# Patient Record
Sex: Male | Born: 1935 | Race: White | Hispanic: No | Marital: Married | Smoking: Former smoker
Health system: Southern US, Community
[De-identification: ages and names within clinical notes are randomized; demographics above are authoritative.]

## PROBLEM LIST (undated history)

## (undated) DIAGNOSIS — K219 Gastro-esophageal reflux disease without esophagitis: Secondary | ICD-10-CM

## (undated) DIAGNOSIS — I10 Essential (primary) hypertension: Secondary | ICD-10-CM

## (undated) DIAGNOSIS — J9819 Other pulmonary collapse: Secondary | ICD-10-CM

## (undated) DIAGNOSIS — Z8673 Personal history of transient ischemic attack (TIA), and cerebral infarction without residual deficits: Secondary | ICD-10-CM

## (undated) DIAGNOSIS — E119 Type 2 diabetes mellitus without complications: Secondary | ICD-10-CM

## (undated) DIAGNOSIS — N200 Calculus of kidney: Secondary | ICD-10-CM

## (undated) DIAGNOSIS — I2510 Atherosclerotic heart disease of native coronary artery without angina pectoris: Secondary | ICD-10-CM

## (undated) HISTORY — PX: HEMORRHOID SURGERY: SHX153

## (undated) HISTORY — DX: Atherosclerotic heart disease of native coronary artery without angina pectoris: I25.10

## (undated) HISTORY — PX: TONSILLECTOMY: SUR1361

## (undated) HISTORY — PX: APPENDECTOMY: SHX54

## (undated) HISTORY — PX: KIDNEY STONE SURGERY: SHX686

---

## 2005-02-08 ENCOUNTER — Ambulatory Visit: Payer: Self-pay | Admitting: Internal Medicine

## 2005-02-08 ENCOUNTER — Ambulatory Visit (HOSPITAL_COMMUNITY): Admission: RE | Admit: 2005-02-08 | Discharge: 2005-02-08 | Payer: Self-pay | Admitting: Internal Medicine

## 2006-11-25 ENCOUNTER — Encounter: Admission: RE | Admit: 2006-11-25 | Discharge: 2006-11-25 | Payer: Self-pay | Admitting: Orthopedic Surgery

## 2006-11-28 ENCOUNTER — Ambulatory Visit (HOSPITAL_BASED_OUTPATIENT_CLINIC_OR_DEPARTMENT_OTHER): Admission: RE | Admit: 2006-11-28 | Discharge: 2006-11-28 | Payer: Self-pay | Admitting: Orthopedic Surgery

## 2007-02-12 ENCOUNTER — Ambulatory Visit (HOSPITAL_BASED_OUTPATIENT_CLINIC_OR_DEPARTMENT_OTHER): Admission: RE | Admit: 2007-02-12 | Discharge: 2007-02-12 | Payer: Self-pay | Admitting: Orthopedic Surgery

## 2013-12-31 ENCOUNTER — Encounter (HOSPITAL_COMMUNITY): Payer: Self-pay | Admitting: Emergency Medicine

## 2013-12-31 ENCOUNTER — Emergency Department (HOSPITAL_COMMUNITY)
Admission: EM | Admit: 2013-12-31 | Discharge: 2013-12-31 | Disposition: A | Discharge status: Home / Self Care | Payer: Medicare Other | Attending: Emergency Medicine | Admitting: Emergency Medicine

## 2013-12-31 ENCOUNTER — Other Ambulatory Visit: Payer: Self-pay

## 2013-12-31 ENCOUNTER — Emergency Department (HOSPITAL_COMMUNITY): Payer: Medicare Other

## 2013-12-31 DIAGNOSIS — R21 Rash and other nonspecific skin eruption: Secondary | ICD-10-CM | POA: Insufficient documentation

## 2013-12-31 DIAGNOSIS — K219 Gastro-esophageal reflux disease without esophagitis: Secondary | ICD-10-CM | POA: Insufficient documentation

## 2013-12-31 DIAGNOSIS — Z79899 Other long term (current) drug therapy: Secondary | ICD-10-CM | POA: Insufficient documentation

## 2013-12-31 DIAGNOSIS — R0781 Pleurodynia: Secondary | ICD-10-CM

## 2013-12-31 DIAGNOSIS — Z87442 Personal history of urinary calculi: Secondary | ICD-10-CM | POA: Insufficient documentation

## 2013-12-31 DIAGNOSIS — R071 Chest pain on breathing: Secondary | ICD-10-CM | POA: Insufficient documentation

## 2013-12-31 DIAGNOSIS — I10 Essential (primary) hypertension: Secondary | ICD-10-CM | POA: Insufficient documentation

## 2013-12-31 DIAGNOSIS — E119 Type 2 diabetes mellitus without complications: Secondary | ICD-10-CM | POA: Insufficient documentation

## 2013-12-31 DIAGNOSIS — Z87891 Personal history of nicotine dependence: Secondary | ICD-10-CM | POA: Insufficient documentation

## 2013-12-31 DIAGNOSIS — R609 Edema, unspecified: Secondary | ICD-10-CM | POA: Insufficient documentation

## 2013-12-31 HISTORY — DX: Gastro-esophageal reflux disease without esophagitis: K21.9

## 2013-12-31 HISTORY — DX: Calculus of kidney: N20.0

## 2013-12-31 HISTORY — DX: Type 2 diabetes mellitus without complications: E11.9

## 2013-12-31 HISTORY — DX: Essential (primary) hypertension: I10

## 2013-12-31 LAB — BASIC METABOLIC PANEL
BUN: 24 mg/dL — AB (ref 6–23)
CHLORIDE: 98 meq/L (ref 96–112)
CO2: 28 mEq/L (ref 19–32)
CREATININE: 1.04 mg/dL (ref 0.50–1.35)
Calcium: 9.3 mg/dL (ref 8.4–10.5)
GFR, EST AFRICAN AMERICAN: 78 mL/min — AB (ref >90)
GFR, EST NON AFRICAN AMERICAN: 67 mL/min — AB (ref >90)
Glucose, Bld: 151 mg/dL — ABNORMAL HIGH (ref 70–99)
Potassium: 4.1 mEq/L (ref 3.7–5.3)
Sodium: 139 mEq/L (ref 137–147)

## 2013-12-31 LAB — CBC WITH DIFFERENTIAL/PLATELET
BASOS PCT: 0 % (ref 0–1)
Basophils Absolute: 0 10*3/uL (ref 0.0–0.1)
EOS ABS: 0.2 10*3/uL (ref 0.0–0.7)
Eosinophils Relative: 2 % (ref 0–5)
HEMATOCRIT: 40.4 % (ref 39.0–52.0)
HEMOGLOBIN: 13.8 g/dL (ref 13.0–17.0)
Lymphocytes Relative: 24 % (ref 12–46)
Lymphs Abs: 1.7 10*3/uL (ref 0.7–4.0)
MCH: 32.9 pg (ref 26.0–34.0)
MCHC: 34.2 g/dL (ref 30.0–36.0)
MCV: 96.2 fL (ref 78.0–100.0)
MONO ABS: 0.7 10*3/uL (ref 0.1–1.0)
MONOS PCT: 9 % (ref 3–12)
Neutro Abs: 4.7 10*3/uL (ref 1.7–7.7)
Neutrophils Relative %: 65 % (ref 43–77)
Platelets: 164 10*3/uL (ref 150–400)
RBC: 4.2 MIL/uL — ABNORMAL LOW (ref 4.22–5.81)
RDW: 12.6 % (ref 11.5–15.5)
WBC: 7.3 10*3/uL (ref 4.0–10.5)

## 2013-12-31 LAB — D-DIMER, QUANTITATIVE: D-Dimer, Quant: 0.6 ug/mL-FEU — ABNORMAL HIGH (ref 0.00–0.48)

## 2013-12-31 LAB — TROPONIN I: Troponin I: <0.3 ng/mL (ref <0.30)

## 2013-12-31 MED ORDER — OXYCODONE-ACETAMINOPHEN 5-325 MG PO TABS
2.0000 | ORAL_TABLET | Freq: Once | ORAL | Status: AC
  Administered 2013-12-31: 2 via ORAL
  Filled 2013-12-31: qty 2

## 2013-12-31 MED ORDER — IOHEXOL 350 MG/ML SOLN
100.0000 mL | Freq: Once | INTRAVENOUS | Status: AC | PRN
  Administered 2013-12-31: 100 mL via INTRAVENOUS
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
## 2013-12-31 NOTE — ED Notes (Signed)
Pt alert & oriented x4, stable gait. Patient given discharge instructions, paperwork & prescription(s). Patient Parent verbalized understanding. Pt left department w/ no further questions.

## 2013-12-31 NOTE — ED Provider Notes (Signed)
CSN: [id number]     Arrival date & time 12/31/13  1854 History  This chart was scribed for Hilario Quarryanielle S Fredick Schlosser, MD by Quintella ReichertMatthew Underwood, ED scribe.  This patient was seen in room [facility] and the patient's care was started at 7:20 PM.   Chief Complaint  Patient presents with  . Chest Pain    Patient is a 80 y.o. male presenting with chest pain. The history is provided by the patient. No language interpreter was used.  Chest Pain Pain location:  R chest Duration: 1-2 hours. Progression:  Worsening Chronicity:  New Worsened by:  Deep breathing Associated symptoms: no cough, no fever, no lower extremity edema and no shortness of breath     HPI Comments: Westley Footsroy F Ehrler Jr. is a 80 y.o. male with h/o HTN and DM who presents to the Emergency Department complaining of 1-2 hours of right-sided lower front chest pain on deep breathing.  Pain is constant and mild when not breathing but becomes severe with deep breathing.  Pt states it hurts to breathe but denies actual SOB.  He denies new cough, fever, or leg swelling.  He denies h/o DVT/PE or recent long travel.    Past Medical History  Diagnosis Date  . Hypertension   . Diabetes mellitus without complication   . GERD (gastroesophageal reflux disease)   . Kidney stones     Past Surgical History  Procedure Laterality Date  . Kidney stone surgery    . Hemorrhoid surgery      History reviewed. No pertinent family history.   History  Substance Use Topics  . Smoking status: Former Games developermoker  . Smokeless tobacco: Not on file  . Alcohol Use: No     Review of Systems  Constitutional: Negative for fever.  Respiratory: Negative for cough and shortness of breath.   Cardiovascular: Positive for chest pain. Negative for leg swelling.  All other systems reviewed and are negative.     Allergies  Sulfa antibiotics  Home Medications   Current Outpatient Rx  Name  Route  Sig  Dispense  Refill  . metFORMIN (GLUCOPHAGE) 500 MG tablet    Oral   Take 500 mg by mouth 2 (two) times daily with a meal.         . omeprazole (PRILOSEC) 20 MG capsule   Oral   Take 20 mg by mouth daily.         . ramipril (ALTACE) 2.5 MG capsule   Oral   Take 2.5 mg by mouth daily.          BP 145/65  Pulse 97  Temp(Src) 98 F (36.7 C) (Oral)  Resp 18  Ht 5\' 9"  (1.753 m)  Wt 150 lb (68.04 kg)  BMI 22.14 kg/m2  SpO2 97%   Physical Exam  Nursing note and vitals reviewed. Constitutional: He is oriented to person, place, and time. He appears well-developed and well-nourished. No distress.  HENT:  Head: Normocephalic and atraumatic.  Mouth/Throat: Mucous membranes are dry.  Hearing aids in both ears mms slightly dry  Eyes: EOM are normal.  Neck: Neck supple. No tracheal deviation present.  Cardiovascular: Normal rate.   Pulmonary/Chest: Effort normal. No respiratory distress.  Musculoskeletal: Normal range of motion. He exhibits edema.  Minimal edema bilaterally, right greater than left.  Neurological: He is alert and oriented to person, place, and time.  Skin: Skin is warm and dry. Rash noted.  Erythematous patches from mid-lower cheset down to above umbilicus, and on right  arm.  Psychiatric: He has a normal mood and affect. His behavior is normal.    ED Course  Procedures (including critical care time)  DIAGNOSTIC STUDIES: Oxygen Saturation is 97% on room air, normal by my interpretation.    COORDINATION OF CARE: 7:26 PM-Discussed treatment plan which includes EKG with pt at bedside and pt agreed to plan.   . Date: 12/31/2013  Rate: 93  Rhythm: normal sinus rhythm  QRS Axis: normal  Intervals: normal  ST/T Wave abnormalities: normal  Conduction Disutrbances:none  Narrative Interpretation:   Old EKG Reviewed: none available  Labs Review Labs Reviewed  CBC WITH DIFFERENTIAL - Abnormal; Notable for the following:    RBC 4.20 (*)    All other components within normal limits  BASIC METABOLIC PANEL - Abnormal;  Notable for the following:    Glucose, Bld 151 (*)    BUN 24 (*)    GFR calc non Af Amer 67 (*)    GFR calc Af Amer 78 (*)    All other components within normal limits  D-DIMER, QUANTITATIVE - Abnormal; Notable for the following:    D-Dimer, Quant 0.60 (*)    All other components within normal limits  TROPONIN I    Imaging Review Dg Chest 2 View  12/31/2013   CLINICAL DATA:  Hypertension.  Chest pain.  EXAM: CHEST  2 VIEW  COMPARISON:  11/25/2006  FINDINGS: Shallow inspiration. Heart size and pulmonary vascularity are normal for technique. Calcified and tortuous aorta. No focal airspace disease in the lungs. Degenerative changes in the spine. No significant change since prior study.  IMPRESSION: No active cardiopulmonary disease.   Electronically Signed   By: Burman Nieves M.D.   On: 12/31/2013 21:57   Ct Angio Chest Pe W/cm &/or Wo Cm  12/31/2013   CLINICAL DATA:  Right chest pain worsening with inspiration. D-dimer 0.6  EXAM: CT ANGIOGRAPHY CHEST WITH CONTRAST  TECHNIQUE: Multidetector CT imaging of the chest was performed using the standard protocol during bolus administration of intravenous contrast. Multiplanar CT image reconstructions including MIPs were obtained to evaluate the vascular anatomy.  CONTRAST:  OMNIPAQUE IOHEXOL 350 MG/ML SOLN  COMPARISON:  None.  FINDINGS: Technically adequate study with good opacification of the central and segmental pulmonary arteries. No focal filling defects are demonstrated. No evidence of significant pulmonary embolus.  Normal heart size. Normal caliber thoracic aorta. No aortic dissection. Calcification in the aortic arch. Esophagus is mostly decompressed. Scattered lymph nodes in the mediastinum are not pathologically enlarged. No pleural effusions. Visualized portions of the upper abdominal organs demonstrate a large cyst in the right kidney.  Dependent atelectatic changes in the lung bases. No focal airspace disease or consolidation. Airways  appear patent. No pneumothorax. Degenerative changes in the spine.  Review of the MIP images confirms the above findings.  IMPRESSION: No evidence of significant pulmonary embolus.   Electronically Signed   By: Burman Nieves M.D.   On: 12/31/2013 22:06    EKG Interpretation   None       MDM  No diagnosis found. 80 year old male with pleuritic type right-sided chest pain presents today with negative workup for pulmonary embolism. EKG was normal and troponins are normal. Have extremely low index of suspicion for this being cardiac in nature. The patient has been observed for several hours and has pain only with deep inspiration. He is advised regarding followup and discharged home.  I personally performed the services described in this documentation, which was scribed in my  presence. The recorded information has been reviewed and considered.    Hilario Quarry, MD 12/31/13 [phone]
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
## 2013-12-31 NOTE — Discharge Instructions (Signed)
Pleurisy °Pleurisy is redness, puffiness (swelling), and soreness (inflammation) of the lining of the lungs. It can be hard to breathe and hurt to breathe. Coughing or deep breathing will make it hurt more. It is often caused by an existing infection or disease.  °HOME CARE °· Only take medicine as told by your doctor. °· Only take antibiotic medicine as directed. Make sure to finish it even if you start to feel better. °GET HELP RIGHT AWAY IF:  °· Your lips, fingernails, or toenails are blue or dark. °· You cough up blood. °· You have a hard time breathing. °· Your pain is not controlled with medicine or it lasts for more than 1 week. °· Your pain spreads (radiates) into your neck, arms, or jaw. °· You are short of breath or wheezing. °· You develop a fever, rash, throw up (vomit), or faint. °MAKE SURE YOU:  °· Understand these instructions. °· Will watch your condition. °· Will get help right away if you are not doing well or get worse. °Document Released: 11/29/2008 Document Revised: 08/19/2013 Document Reviewed: 05/31/2013 °[employer]® Patient Information ©2014 [employer]. ° °

## 2014-07-22 ENCOUNTER — Ambulatory Visit (INDEPENDENT_AMBULATORY_CARE_PROVIDER_SITE_OTHER): Payer: Medicare Other | Admitting: Internal Medicine

## 2014-07-22 ENCOUNTER — Encounter (INDEPENDENT_AMBULATORY_CARE_PROVIDER_SITE_OTHER): Payer: Self-pay | Admitting: Internal Medicine

## 2014-07-22 VITALS — BP 122/66 | HR 88 | Temp 98.0°F | Ht 69.0 in | Wt 151.9 lb

## 2014-07-22 DIAGNOSIS — K5900 Constipation, unspecified: Secondary | ICD-10-CM | POA: Insufficient documentation

## 2014-07-22 DIAGNOSIS — K219 Gastro-esophageal reflux disease without esophagitis: Secondary | ICD-10-CM | POA: Insufficient documentation

## 2014-07-22 DIAGNOSIS — I10 Essential (primary) hypertension: Secondary | ICD-10-CM

## 2014-07-22 DIAGNOSIS — E119 Type 2 diabetes mellitus without complications: Secondary | ICD-10-CM | POA: Insufficient documentation

## 2014-07-22 NOTE — Progress Notes (Signed)
Subjective:     Patient ID: Martin Frazier, male   DOB: 09/08/1934, 80 y.o.   MRN: [id number]  HPI Referred to our office by Dr. Margo Common for IBS.  Patient states when he eats, he has continuous burping.  His symptoms start 30 minutes to an hour after he eats. He also has rt lower quadrant pain. He has had these symptoms all his life. He tells me he has started eating in smaller amounts.  Sometimes he will have reflux at night and can feel it come up into his esophagus.  He says he stays hungry.   He was given Doxycyline x 7 days for removal of a skin cancer. Says he became constipated after that. He tells me his stools are hard and sometimes he will have 2-3 stools a day. Presently taking Acidophilus and eats prunes in the morning. For the most part is appetite is good. No weight loss. Sometimes he has pill dysphagia but not on a daily basis. He tells me his acid reflux for the most part controlled with Omeprazole.  No melena or BRRB. 02/08/2005 Colonoscopy, Dr. Karilyn Cota: recurrent lower abdominal pain and intermittent postprandial diarrhea felt to have IBS who has not responded to therapy: Tortuous, very noncompliant sigmoid colon preventing complete examination. Sigmoid colon and rectal mucosa was normal. Small external hemorrhoids with an anal papilla.   Review of Systems Past Medical History  Diagnosis Date  . Hypertension   . Diabetes mellitus without complication     over 10 yrs  . GERD (gastroesophageal reflux disease)   . Kidney stones     Past Surgical History  Procedure Laterality Date  . Kidney stone surgery    . Hemorrhoid surgery      Allergies  Allergen Reactions  . Sulfa Antibiotics Nausea And Vomiting    Current Outpatient Prescriptions on File Prior to Visit  Medication Sig Dispense Refill  . aspirin EC 81 MG tablet Take 81 mg by mouth daily.      . Ginkgo Biloba (GINKOBA PO) Take by mouth.      . metFORMIN (GLUCOPHAGE) 500 MG tablet Take 500 mg by mouth 2 (two) times  daily with a meal.      . naproxen sodium (ALEVE) 220 MG tablet Take 220 mg by mouth as needed.       Marland Kitchen omeprazole (PRILOSEC) 20 MG capsule Take 20 mg by mouth daily.      . ramipril (ALTACE) 2.5 MG capsule Take 2.5 mg by mouth daily.      . Saw Palmetto, Serenoa repens, (SAW PALMETTO PO) Take 1 capsule by mouth daily.      . vitamin E 400 UNIT capsule Take 400 Units by mouth daily.       No current facility-administered medications on file prior to visit.        Objective:   Physical Exam  Filed Vitals:   07/22/14 1422  BP: 122/66  Pulse: 88  Temp: 98 F (36.7 C)  Height: 5\' 9"  (1.753 m)  Weight: 151 lb 14.4 oz (68.901 kg)  Alert and oriented. Skin warm and dry. Oral mucosa is moist.   . Sclera anicteric, conjunctivae is pink. Thyroid not enlarged. No cervical lymphadenopathy. Lungs clear. Heart regular rate and rhythm.  Abdomen is soft. Bowel sounds are positive. No hepatomegaly. No abdominal masses felt. No tenderness.  No edema to lower extremities.         Assessment:    Constipation. Patient states started couple of months  ago after taking Doxycycline. I discussed this case with Dr. Karilyn Cotaehman. Next colonoscopy next year.     Plan:    Colace one in am and one in pm. PR in 2 weeks. If not better will switch to Miralax 1/2 scoop daily.

## 2014-07-22 NOTE — Patient Instructions (Signed)
Colace one in am and one in pm. PR in 2 weeks. If not better, Miralax 1/2 scoop daily

## 2014-08-05 ENCOUNTER — Telehealth (INDEPENDENT_AMBULATORY_CARE_PROVIDER_SITE_OTHER): Payer: Self-pay | Admitting: *Deleted

## 2014-08-05 NOTE — Telephone Encounter (Signed)
Stools are formed. Usually has 2 BMs a day.  States he is dong better.

## 2014-08-05 NOTE — Telephone Encounter (Signed)
Patient was told to call you in 2 weeks to talk to you about his progress

## 2014-08-05 NOTE — Telephone Encounter (Signed)
Take either the Miralax or Colace. PR in 2 weeks

## 2014-12-17 ENCOUNTER — Encounter (INDEPENDENT_AMBULATORY_CARE_PROVIDER_SITE_OTHER): Payer: Self-pay

## 2016-04-20 ENCOUNTER — Encounter (HOSPITAL_COMMUNITY): Payer: Self-pay | Admitting: Emergency Medicine

## 2016-04-20 ENCOUNTER — Emergency Department (HOSPITAL_COMMUNITY)
Admission: EM | Admit: 2016-04-20 | Discharge: 2016-04-20 | Disposition: A | Discharge status: Home / Self Care | Payer: Medicare Other | Attending: Emergency Medicine | Admitting: Emergency Medicine

## 2016-04-20 DIAGNOSIS — Z87891 Personal history of nicotine dependence: Secondary | ICD-10-CM | POA: Diagnosis not present

## 2016-04-20 DIAGNOSIS — Y999 Unspecified external cause status: Secondary | ICD-10-CM | POA: Insufficient documentation

## 2016-04-20 DIAGNOSIS — Y929 Unspecified place or not applicable: Secondary | ICD-10-CM | POA: Diagnosis not present

## 2016-04-20 DIAGNOSIS — E119 Type 2 diabetes mellitus without complications: Secondary | ICD-10-CM | POA: Diagnosis not present

## 2016-04-20 DIAGNOSIS — Y9389 Activity, other specified: Secondary | ICD-10-CM | POA: Diagnosis not present

## 2016-04-20 DIAGNOSIS — Z7984 Long term (current) use of oral hypoglycemic drugs: Secondary | ICD-10-CM | POA: Insufficient documentation

## 2016-04-20 DIAGNOSIS — W108XXA Fall (on) (from) other stairs and steps, initial encounter: Secondary | ICD-10-CM | POA: Insufficient documentation

## 2016-04-20 DIAGNOSIS — W19XXXA Unspecified fall, initial encounter: Secondary | ICD-10-CM

## 2016-04-20 DIAGNOSIS — Z79899 Other long term (current) drug therapy: Secondary | ICD-10-CM | POA: Diagnosis not present

## 2016-04-20 DIAGNOSIS — Z23 Encounter for immunization: Secondary | ICD-10-CM | POA: Insufficient documentation

## 2016-04-20 DIAGNOSIS — S0191XA Laceration without foreign body of unspecified part of head, initial encounter: Secondary | ICD-10-CM | POA: Diagnosis present

## 2016-04-20 DIAGNOSIS — Z7982 Long term (current) use of aspirin: Secondary | ICD-10-CM | POA: Diagnosis not present

## 2016-04-20 DIAGNOSIS — S01112A Laceration without foreign body of left eyelid and periocular area, initial encounter: Secondary | ICD-10-CM | POA: Insufficient documentation

## 2016-04-20 DIAGNOSIS — I10 Essential (primary) hypertension: Secondary | ICD-10-CM | POA: Diagnosis not present

## 2016-04-20 MED ORDER — LIDOCAINE-EPINEPHRINE (PF) 2 %-1:200000 IJ SOLN
20.0000 mL | Freq: Once | INTRAMUSCULAR | Status: DC
  Filled 2016-04-20: qty 20

## 2016-04-20 MED ORDER — TETANUS-DIPHTH-ACELL PERTUSSIS 5-2.5-18.5 LF-MCG/0.5 IM SUSP
0.5000 mL | Freq: Once | INTRAMUSCULAR | Status: AC
  Administered 2016-04-20: 0.5 mL via INTRAMUSCULAR
  Filled 2016-04-20: qty 0.5

## 2016-04-20 NOTE — ED Notes (Signed)
Patient with no complaints at this time. Respirations even and unlabored. Skin warm/dry. Discharge instructions reviewed with patient at this time. Patient given opportunity to voice concerns/ask questions. Patient discharged at this time and left Emergency Department with steady gait.   

## 2016-04-20 NOTE — ED Notes (Signed)
Wound cleansed with shur-cleans. Suture cart at bedside.

## 2016-04-20 NOTE — ED Notes (Addendum)
Tripped going up the brick steps.  Hit head, rates pain 0/10.  Pt currently on ASA 81 mg.  Denies dizziness.

## 2016-04-20 NOTE — ED Provider Notes (Signed)
CSN: [id number]     Arrival date & time 04/20/16  1237 History   First MD Initiated Contact with Patient 04/20/16 1326     Chief Complaint  Patient presents with  . Head Laceration     (Consider location/radiation/quality/duration/timing/severity/associated sxs/prior Treatment) HPI..... Accidental trip on the stairs striking his left forehead. No loss of consciousness or neurological deficits. No neck pain. No extremity trauma. There is a small laceration above the left eyebrow  Past Medical History  Diagnosis Date  . Hypertension   . Diabetes mellitus without complication (HCC)     over 10 yrs  . GERD (gastroesophageal reflux disease)   . Kidney stones    Past Surgical History  Procedure Laterality Date  . Kidney stone surgery    . Hemorrhoid surgery     History reviewed. No pertinent family history. Social History  Substance Use Topics  . Smoking status: Former Games developer  . Smokeless tobacco: None     Comment: quit 2006  . Alcohol Use: No    Review of Systems  All other systems reviewed and are negative.     Allergies  Sulfa antibiotics  Home Medications   Prior to Admission medications   Medication Sig Start Date End Date Taking? Authorizing Provider  Arginine 500 MG CAPS Take by mouth.   Yes Historical Provider, MD  aspirin EC 81 MG tablet Take 81 mg by mouth daily.   Yes Historical Provider, MD  Ginkgo Biloba (GINKOBA PO) Take by mouth.   Yes Historical Provider, MD  metFORMIN (GLUCOPHAGE) 500 MG tablet Take 500 mg by mouth 2 (two) times daily with a meal.   Yes Historical Provider, MD  omeprazole (PRILOSEC) 20 MG capsule Take 20 mg by mouth daily.   Yes Historical Provider, MD  ramipril (ALTACE) 2.5 MG capsule Take 2.5 mg by mouth daily.   Yes Historical Provider, MD  Saw Palmetto, Serenoa repens, (SAW PALMETTO PO) Take 1 capsule by mouth daily.   Yes Historical Provider, MD  vitamin B-12 (CYANOCOBALAMIN) 1000 MCG tablet Take 1,000 mcg by mouth 2 (two) times  daily.   Yes Historical Provider, MD  vitamin E 400 UNIT capsule Take 400 Units by mouth daily.   Yes Historical Provider, MD  naproxen sodium (ALEVE) 220 MG tablet Take 220 mg by mouth as needed. Reported on 04/20/2016    Historical Provider, MD  psyllium (METAMUCIL) 58.6 % packet Take 1 packet by mouth daily. Reported on 04/20/2016    Historical Provider, MD   BP 127/67 mmHg  Pulse 84  Temp(Src) 97.9 F (36.6 C) (Oral)  Resp 17  Ht 5\' 10"  (1.778 m)  Wt 150 lb (68.04 kg)  BMI 21.52 kg/m2  SpO2 96% Physical Exam  Constitutional: He is oriented to person, place, and time. He appears well-developed and well-nourished.  HENT:  Head: Normocephalic.  2.5 cm laceration superior to left eyebrow  Eyes: Conjunctivae and EOM are normal. Pupils are equal, round, and reactive to light.  Neck: Normal range of motion. Neck supple.  Cardiovascular: Normal rate and regular rhythm.   Pulmonary/Chest: Effort normal and breath sounds normal.  Abdominal: Soft. Bowel sounds are normal.  Musculoskeletal: Normal range of motion.  Neurological: He is alert and oriented to person, place, and time.  Skin: Skin is warm and dry.  Psychiatric: He has a normal mood and affect. His behavior is normal.  Nursing note and vitals reviewed.   ED Course  .Marland KitchenLaceration Repair Date/Time: 04/20/2016 1:30 PM Performed by: Donnetta Hutching Authorized  by: Donnetta Hutching Consent: Verbal consent obtained. Risks and benefits: risks, benefits and alternatives were discussed Consent given by: patient Comments: Laceration repair:   2.5 cm laceration above left eyebrow. Wound anesthetized with 2% Xylocaine with epinephrine 1 200,0003 mL. Copious irrigation. No foreign body noted. 2 layer closure: Subcutaneous layer closed with 6-0 Vicryl times 1 suture. Skin closed with 5-0 Prolene 3. Sterile dressing applied.   (including critical care time) Labs Review Labs Reviewed - No data to display  Imaging Review No results found. I have  personally reviewed and evaluated these images and lab results as part of my medical decision-making.   EKG Interpretation None      MDM   Final diagnoses:  Fall, initial encounter  Laceration of head, initial encounter    Patient is exhibiting no neurological deficits. Wound closure per above procedure note.    Donnetta Hutching, MD 04/20/16 [phone]

## 2016-04-20 NOTE — Discharge Instructions (Signed)
Keep wound dry for 2 days and do not disturb the dressing. After that a simple dressing change daily. Suture out in 1 week.

## 2016-12-20 ENCOUNTER — Emergency Department (HOSPITAL_COMMUNITY): Payer: Medicare Other

## 2016-12-20 ENCOUNTER — Emergency Department (HOSPITAL_COMMUNITY)
Admission: EM | Admit: 2016-12-20 | Discharge: 2016-12-21 | Disposition: A | Discharge status: Home / Self Care | Payer: Medicare Other | Attending: Emergency Medicine | Admitting: Emergency Medicine

## 2016-12-20 ENCOUNTER — Encounter (HOSPITAL_COMMUNITY): Payer: Self-pay | Admitting: Emergency Medicine

## 2016-12-20 DIAGNOSIS — I10 Essential (primary) hypertension: Secondary | ICD-10-CM | POA: Insufficient documentation

## 2016-12-20 DIAGNOSIS — E119 Type 2 diabetes mellitus without complications: Secondary | ICD-10-CM | POA: Diagnosis not present

## 2016-12-20 DIAGNOSIS — S8011XA Contusion of right lower leg, initial encounter: Secondary | ICD-10-CM | POA: Diagnosis not present

## 2016-12-20 DIAGNOSIS — Y999 Unspecified external cause status: Secondary | ICD-10-CM | POA: Diagnosis not present

## 2016-12-20 DIAGNOSIS — Z87891 Personal history of nicotine dependence: Secondary | ICD-10-CM | POA: Insufficient documentation

## 2016-12-20 DIAGNOSIS — L03116 Cellulitis of left lower limb: Secondary | ICD-10-CM | POA: Insufficient documentation

## 2016-12-20 DIAGNOSIS — Y929 Unspecified place or not applicable: Secondary | ICD-10-CM | POA: Insufficient documentation

## 2016-12-20 DIAGNOSIS — W1809XA Striking against other object with subsequent fall, initial encounter: Secondary | ICD-10-CM | POA: Insufficient documentation

## 2016-12-20 DIAGNOSIS — Y9389 Activity, other specified: Secondary | ICD-10-CM | POA: Diagnosis not present

## 2016-12-20 DIAGNOSIS — S8991XA Unspecified injury of right lower leg, initial encounter: Secondary | ICD-10-CM | POA: Diagnosis present

## 2016-12-20 DIAGNOSIS — Z7982 Long term (current) use of aspirin: Secondary | ICD-10-CM | POA: Insufficient documentation

## 2016-12-20 DIAGNOSIS — Z79899 Other long term (current) drug therapy: Secondary | ICD-10-CM | POA: Insufficient documentation

## 2016-12-20 LAB — BASIC METABOLIC PANEL
Anion gap: 8 (ref 5–15)
BUN: 24 mg/dL — AB (ref 6–20)
CHLORIDE: 105 mmol/L (ref 101–111)
CO2: 27 mmol/L (ref 22–32)
CREATININE: 1.19 mg/dL (ref 0.61–1.24)
Calcium: 9.3 mg/dL (ref 8.9–10.3)
GFR calc Af Amer: >60 mL/min (ref >60)
GFR calc non Af Amer: 56 mL/min — ABNORMAL LOW (ref >60)
GLUCOSE: 147 mg/dL — AB (ref 65–99)
POTASSIUM: 3.9 mmol/L (ref 3.5–5.1)
SODIUM: 140 mmol/L (ref 135–145)

## 2016-12-20 LAB — CBC
HEMATOCRIT: 39.3 % (ref 39.0–52.0)
Hemoglobin: 13.1 g/dL (ref 13.0–17.0)
MCH: 32.6 pg (ref 26.0–34.0)
MCHC: 33.3 g/dL (ref 30.0–36.0)
MCV: 97.8 fL (ref 78.0–100.0)
PLATELETS: 189 10*3/uL (ref 150–400)
RBC: 4.02 MIL/uL — ABNORMAL LOW (ref 4.22–5.81)
RDW: 12.9 % (ref 11.5–15.5)
WBC: 7.7 10*3/uL (ref 4.0–10.5)

## 2016-12-20 LAB — PROTIME-INR
INR: 0.99
Prothrombin Time: 13.1 seconds (ref 11.4–15.2)

## 2016-12-20 LAB — D-DIMER, QUANTITATIVE: D-Dimer, Quant: 0.59 ug/mL-FEU — ABNORMAL HIGH (ref 0.00–0.50)

## 2016-12-20 LAB — APTT: aPTT: 29 seconds (ref 24–36)

## 2016-12-20 MED ORDER — VANCOMYCIN HCL IN DEXTROSE 1-5 GM/200ML-% IV SOLN
1000.0000 mg | Freq: Once | INTRAVENOUS | Status: AC
  Administered 2016-12-21: 1000 mg via INTRAVENOUS
  Filled 2016-12-20: qty 200

## 2016-12-20 MED ORDER — PIPERACILLIN-TAZOBACTAM 3.375 G IVPB
3.3750 g | Freq: Once | INTRAVENOUS | Status: AC
  Administered 2016-12-21: 3.375 g via INTRAVENOUS
  Filled 2016-12-20: qty 50

## 2016-12-20 MED ORDER — LIDOCAINE-EPINEPHRINE (PF) 1 %-1:200000 IJ SOLN
20.0000 mL | Freq: Once | INTRAMUSCULAR | Status: AC
  Administered 2016-12-20: 20 mL
  Filled 2016-12-20: qty 30

## 2016-12-20 NOTE — ED Provider Notes (Signed)
[facility]-EMERGENCY DEPT Provider Note   CSN: [id number] Arrival date & time: 12/20/16  1938  By signing my name below, I, Martin Frazier, attest that this documentation has been prepared under the direction and in the presence of Martin Octave, MD . Electronically Signed: Nelwyn Frazier, Scribe. 12/20/2016. 11:04 PM.  History   Chief Complaint Chief Complaint  Patient presents with  . Leg Pain   The history is provided by the patient. No language interpreter was used.    HPI Comments:  Martin Frazier. is a 82 y.o. male with pmhx of HTN and DM who presents to the Emergency Department complaining of sudden-onset constant worsening left anterior leg pain s/p fall 5 days ago. Pt states that he turned around, lost his balance, and fell.  He notes that he hit his leg on a piece of metal on his fireplace. Pt has been able to ambulate since his fall. He reports associated color change, swelling and wound with bleeding controlled. Pt denies any vomiting, fever, new back pain or new neck pain. He is UTD on his tetanus.   Past Medical History:  Diagnosis Date  . Diabetes mellitus without complication (HCC)    over 10 yrs  . GERD (gastroesophageal reflux disease)   . Hypertension   . Kidney stones     Patient Active Problem List   Diagnosis Date Noted  . Unspecified constipation 07/22/2014  . Diabetes (HCC) 07/22/2014  . Essential hypertension, benign 07/22/2014  . GERD (gastroesophageal reflux disease) 07/22/2014    Past Surgical History:  Procedure Laterality Date  . HEMORRHOID SURGERY    . KIDNEY STONE SURGERY    . TONSILLECTOMY         Home Medications    Prior to Admission medications   Medication Sig Start Date End Date Taking? Authorizing Provider  aspirin EC 81 MG tablet Take 81 mg by mouth daily.   Yes Historical Provider, MD  Ginkgo Biloba (GINKOBA PO) Take by mouth.   Yes Historical Provider, MD  metFORMIN (GLUCOPHAGE) 500 MG tablet Take 500 mg by mouth 2 (two) times  daily with a meal.   Yes Historical Provider, MD  omeprazole (PRILOSEC) 20 MG capsule Take 20 mg by mouth daily.   Yes Historical Provider, MD  ramipril (ALTACE) 2.5 MG capsule Take 2.5 mg by mouth daily.   Yes Historical Provider, MD  Saw Palmetto, Serenoa repens, (SAW PALMETTO PO) Take 1 capsule by mouth daily.   Yes Historical Provider, MD  vitamin B-12 (CYANOCOBALAMIN) 1000 MCG tablet Take 1,000 mcg by mouth 2 (two) times daily.   Yes Historical Provider, MD  vitamin E 400 UNIT capsule Take 400 Units by mouth daily.   Yes Historical Provider, MD  Arginine 500 MG CAPS Take by mouth.    Historical Provider, MD  naproxen sodium (ALEVE) 220 MG tablet Take 220 mg by mouth as needed. Reported on 04/20/2016    Historical Provider, MD    Family History No family history on file.  Social History Social History  Substance Use Topics  . Smoking status: Former Games developer  . Smokeless tobacco: Never Used     Comment: quit 2006  . Alcohol use No     Allergies   Sulfa antibiotics   Review of Systems Review of Systems  Constitutional: Negative for fever.  Gastrointestinal: Negative for vomiting.  Musculoskeletal: Positive for joint swelling and myalgias (Left Leg). Negative for back pain and neck pain.  Skin: Positive for color change and wound.  All  other systems reviewed and are negative.    Physical Exam Updated Vital Signs BP 137/65 (BP Location: Right Arm)   Pulse 87   Temp 98.5 F (36.9 C) (Oral)   Resp 18   Ht 5\' 11"  (1.803 m)   Wt 147 lb (66.7 kg)   SpO2 99%   BMI 20.50 kg/m   Physical Exam  Constitutional: He is oriented to person, place, and time. He appears well-developed and well-nourished. No distress.  HENT:  Head: Normocephalic and atraumatic.  Mouth/Throat: Oropharynx is clear and moist. No oropharyngeal exudate.  Eyes: Conjunctivae and EOM are normal. Pupils are equal, round, and reactive to light.  Neck: Normal range of motion. Neck supple.  No meningismus.    Cardiovascular: Normal rate, regular rhythm, normal heart sounds and intact distal pulses.   No murmur heard. Pulmonary/Chest: Effort normal and breath sounds normal. No respiratory distress.  Abdominal: Soft. There is no tenderness. There is no rebound and no guarding.  Musculoskeletal: Normal range of motion. He exhibits no edema or tenderness.  Neurological: He is alert and oriented to person, place, and time. No cranial nerve deficit. He exhibits normal muscle tone. Coordination normal.   5/5 strength throughout. CN 2-12 intact.Equal grip strength.   Skin: Skin is warm.  5cmx8cm erythematous area of fluctuance with overlying areas of black eschar some purulent drainage. Entire LLE is swollen with streaking ecchymosis to ankle. Intact DP and PT pulses. Compartments soft.   Psychiatric: He has a normal mood and affect. His behavior is normal.  Nursing note and vitals reviewed.        ED Treatments / Results  DIAGNOSTIC STUDIES:  Oxygen Saturation is 98% on RA, normal by my interpretation.    COORDINATION OF CARE:  11:08 PM Discussed treatment plan with pt at bedside which includes x-ray of left leg and pt agreed to plan.  12:26 AM Spoke with pt about potential admission for IV antibiotics. Pt denies admission and instead wishes to return for a wound check in two days.    Labs (all labs ordered are listed, but only abnormal results are displayed) Labs Reviewed  BASIC METABOLIC PANEL - Abnormal; Notable for the following:       Result Value   Glucose, Bld 147 (*)    BUN 24 (*)    GFR calc non Af Amer 56 (*)    All other components within normal limits  CBC - Abnormal; Notable for the following:    RBC 4.02 (*)    All other components within normal limits  D-DIMER, QUANTITATIVE (NOT AT [facility]) - Abnormal; Notable for the following:    D-Dimer, Quant 0.59 (*)    All other components within normal limits  AEROBIC CULTURE (SUPERFICIAL SPECIMEN)  CULTURE, BLOOD (ROUTINE X 2)   CULTURE, BLOOD (ROUTINE X 2)  PROTIME-INR  APTT  I-STAT CG4 LACTIC ACID, ED    EKG  EKG Interpretation None       Radiology Dg Tibia/fibula Left  Result Date: 12/20/2016 CLINICAL DATA:  Initial evaluation for acute trauma, fall. EXAM: LEFT TIBIA AND FIBULA - 2 VIEW COMPARISON:  None. FINDINGS: No acute fracture dislocation. Soft tissue swelling at the anterior aspect of the upper leg, suspicious for contusion. No other acute soft tissue abnormality. Degenerative changes noted about the partially visualized knee. IMPRESSION: 1. No acute fracture or dislocation. 2. Soft tissue contusion at the anterior aspect of the upper left leg. Electronically Signed   By: Rise MuBenjamin  McClintock M.D.   On:  12/20/2016 23:41   Dg Ankle Complete Left  Result Date: 12/20/2016 CLINICAL DATA:  Initial evaluation for acute trauma, fall. EXAM: LEFT ANKLE COMPLETE - 3+ VIEW COMPARISON:  None. FINDINGS: No acute fracture dislocation. Ankle mortise approximated. Talar dome intact. Small posterior and plantar calcaneal enthesophytes noted. Mild soft tissue swelling about the ankle. IMPRESSION: 1. No acute fracture dislocation. 2. Mild diffuse soft tissue swelling about the ankle. Electronically Signed   By: Rise Mu M.D.   On: 12/20/2016 23:43    Procedures Procedures (including critical care time)  Medications Ordered in ED Medications  vancomycin (VANCOCIN) IVPB 1000 mg/200 mL premix (1,000 mg Intravenous New Bag/Given 12/21/16 0010)  piperacillin-tazobactam (ZOSYN) IVPB 3.375 g (3.375 g Intravenous New Bag/Given 12/21/16 0010)  lidocaine (XYLOCAINE) 2 % injection 10 mL (not administered)  lidocaine-EPINEPHrine (XYLOCAINE-EPINEPHrine) 1 %-1:200000 (PF) injection 20 mL (20 mLs Infiltration Given 12/20/16 2332)     Initial Impression / Assessment and Plan / ED Course  I have reviewed the triage vital signs and the nursing notes.  Pertinent labs & imaging results that were available during my  care of the patient were reviewed by me and considered in my medical decision making (see chart for details).  Clinical Course   Patient presents with abrasion and edema and erythema to left leg after a fall 5 days ago. No fever. Intact distal pulses.  Age adjusted D-dimer is negative (was obtained in triage). Low suspicion for DVT given hematoma and infection. Xrays negative.    Wound drained by Danae Orleans. Findings consistent with infected hematoma more so than abscess. Patient feels much improved. He received IV antibiotics in the ED. He does not want to be admitted. D/w Dr. Ophelia Charter who agrees that outpatient treatment with antibiotics and close wound followup appears reasonable.   He is agreeable to wound recheck in 48 hours. He understands he is at increased risk for infection given his age and history of diabetes. He is able to ambulate. He is afebrile and nontoxic appearing. Return precautions discussed.  Final Clinical Impressions(s) / ED Diagnoses   Final diagnoses:  Hematoma of leg, right, initial encounter  Cellulitis of left leg    New Prescriptions New Prescriptions   No medications on file  I personally performed the services described in this documentation, which was scribed in my presence. The recorded information has been reviewed and is accurate.     Martin Octave, MD 12/21/16 [phone]

## 2016-12-20 NOTE — ED Triage Notes (Signed)
Pt lost balance on Saturday and fell, brusing, pain and swelling to lower left leg, state foot is turning white and tingles

## 2016-12-21 DIAGNOSIS — S8011XA Contusion of right lower leg, initial encounter: Secondary | ICD-10-CM | POA: Diagnosis not present

## 2016-12-21 LAB — I-STAT CG4 LACTIC ACID, ED: Lactic Acid, Venous: 1.33 mmol/L (ref 0.5–1.9)

## 2016-12-21 MED ORDER — LIDOCAINE HCL (PF) 2 % IJ SOLN
INTRAMUSCULAR | Status: AC
  Filled 2016-12-21: qty 10

## 2016-12-21 MED ORDER — AMOXICILLIN-POT CLAVULANATE 875-125 MG PO TABS: 1.0000 | ORAL_TABLET | Freq: Two times a day (BID) | ORAL | 0 refills | Status: DC

## 2016-12-21 MED ORDER — LIDOCAINE HCL (PF) 2 % IJ SOLN
10.0000 mL | Freq: Once | INTRAMUSCULAR | Status: AC
  Administered 2016-12-21: 10 mL

## 2016-12-21 MED ORDER — DOXYCYCLINE HYCLATE 100 MG PO CAPS: 100.0000 mg | ORAL_CAPSULE | Freq: Two times a day (BID) | ORAL | 0 refills | Status: DC

## 2016-12-21 NOTE — ED Provider Notes (Signed)
INCISION AND DRAINAGE LEFT LEG.  Patient is an 82 year old male who presents to the emergency department following an injury to the left leg. He has had pain and swelling and redness present. The patient has a fluctuant appearing area in the anterior tibial aspect of the left lower extremity on. I've discussed with the patient the need for possible incision and drainage. I discussed the procedure in terms which he understands. He has permission for the procedure.  Patient was identified by arm band.  The affected area was painted with Betadine. It was then infiltrated with 2% plain lidocaine. After proper application of anesthetic, the patient was draped in the usual sterile fashion. Using an 11 blade scalpel, incision and drainage was carried out. Culture was obtained and sent to the lab. The patient had a copious amount of blood clots present. The area was drained, and then irrigated and drained again. The patient tolerated the procedure without problem. Sterile dressing was applied. Patient is receiving antibiotics, and will return in 48 hours for recheck.   Ivery Quale, PA-C 12/21/16 0123    [name], MD 12/21/16 [phone]

## 2016-12-21 NOTE — Discharge Instructions (Signed)
Take the antibiotics as prescribed. Keep the wound clean. Return to the ED for a recheck of the wound in 2 days. Return to the ED if you develop worsening pain, fever, vomiting, or any other concerns.

## 2016-12-23 ENCOUNTER — Encounter (HOSPITAL_COMMUNITY): Payer: Self-pay | Admitting: Emergency Medicine

## 2016-12-23 ENCOUNTER — Emergency Department (HOSPITAL_COMMUNITY)
Admission: EM | Admit: 2016-12-23 | Discharge: 2016-12-23 | Disposition: A | Discharge status: Home / Self Care | Payer: Medicare Other | Attending: Emergency Medicine | Admitting: Emergency Medicine

## 2016-12-23 DIAGNOSIS — Z87891 Personal history of nicotine dependence: Secondary | ICD-10-CM | POA: Diagnosis not present

## 2016-12-23 DIAGNOSIS — Z5189 Encounter for other specified aftercare: Secondary | ICD-10-CM

## 2016-12-23 DIAGNOSIS — I10 Essential (primary) hypertension: Secondary | ICD-10-CM | POA: Insufficient documentation

## 2016-12-23 DIAGNOSIS — E119 Type 2 diabetes mellitus without complications: Secondary | ICD-10-CM | POA: Insufficient documentation

## 2016-12-23 DIAGNOSIS — Z4801 Encounter for change or removal of surgical wound dressing: Secondary | ICD-10-CM | POA: Diagnosis not present

## 2016-12-23 DIAGNOSIS — Z7982 Long term (current) use of aspirin: Secondary | ICD-10-CM | POA: Insufficient documentation

## 2016-12-23 DIAGNOSIS — Z7984 Long term (current) use of oral hypoglycemic drugs: Secondary | ICD-10-CM | POA: Insufficient documentation

## 2016-12-23 LAB — AEROBIC CULTURE  (SUPERFICIAL SPECIMEN)

## 2016-12-23 LAB — AEROBIC CULTURE W GRAM STAIN (SUPERFICIAL SPECIMEN): Culture: NORMAL

## 2016-12-23 NOTE — Discharge Instructions (Signed)
Continue present therapy. Return for any concerns.

## 2016-12-23 NOTE — ED Provider Notes (Signed)
[facility] DEPT Provider Note   CSN: [id number] Arrival date & time: 12/23/16  6237   By signing my name below, I, Martin Frazier, attest that this documentation has been prepared under the direction and in the presence of Martin Hutching, MD  Electronically Signed: Clovis Frazier, ED Scribe. 12/23/16. 8:33 AM.  History   Chief Complaint Chief Complaint  Patient presents with  . Wound Check   The history is provided by the patient and the spouse. No language interpreter was used.   HPI Comments:  Martin Frazier. is a 82 y.o. male, with a hx of DM, who presents to the Emergency Department for a wound check for an injury to his left lower leg which occurred s/p a fall 8 days ago. Pt has an incision and drainage performed to the affected area on 12/21/16 and was prescribed 2 antibiotics which he has been taking. Pt notes intermittent pain to the area since the procedure. Pt denies any drainage from the wound site, any other associated symptoms and any other modifying factors at this time.    Past Medical History:  Diagnosis Date  . Diabetes mellitus without complication (HCC)    over 10 yrs  . GERD (gastroesophageal reflux disease)   . Hypertension   . Kidney stones     Patient Active Problem List   Diagnosis Date Noted  . Unspecified constipation 07/22/2014  . Diabetes (HCC) 07/22/2014  . Essential hypertension, benign 07/22/2014  . GERD (gastroesophageal reflux disease) 07/22/2014    Past Surgical History:  Procedure Laterality Date  . HEMORRHOID SURGERY    . KIDNEY STONE SURGERY    . TONSILLECTOMY      Home Medications    Prior to Admission medications   Medication Sig Start Date End Date Taking? Authorizing Provider  amoxicillin-clavulanate (AUGMENTIN) 875-125 MG tablet Take 1 tablet by mouth every 12 (twelve) hours. 12/21/16   [name], MD  Arginine 500 MG CAPS Take by mouth.    Historical Provider, MD  aspirin EC 81 MG tablet Take 81 mg by mouth daily.     Historical Provider, MD  doxycycline (VIBRAMYCIN) 100 MG capsule Take 1 capsule (100 mg total) by mouth 2 (two) times daily. 12/21/16   [name], MD  Ginkgo Biloba (GINKOBA PO) Take by mouth.    Historical Provider, MD  metFORMIN (GLUCOPHAGE) 500 MG tablet Take 500 mg by mouth 2 (two) times daily with a meal.    Historical Provider, MD  naproxen sodium (ALEVE) 220 MG tablet Take 220 mg by mouth as needed. Reported on 04/20/2016    Historical Provider, MD  omeprazole (PRILOSEC) 20 MG capsule Take 20 mg by mouth daily.    Historical Provider, MD  ramipril (ALTACE) 2.5 MG capsule Take 2.5 mg by mouth daily.    Historical Provider, MD  Saw Palmetto, Serenoa repens, (SAW PALMETTO PO) Take 1 capsule by mouth daily.    Historical Provider, MD  vitamin B-12 (CYANOCOBALAMIN) 1000 MCG tablet Take 1,000 mcg by mouth 2 (two) times daily.    Historical Provider, MD  vitamin E 400 UNIT capsule Take 400 Units by mouth daily.    Historical Provider, MD    Family History History reviewed. No pertinent family history.  Social History Social History  Substance Use Topics  . Smoking status: Former Games developer  . Smokeless tobacco: Never Used     Comment: quit 2006  . Alcohol use No     Allergies   Sulfa antibiotics   Review of  Systems Review of Systems  Constitutional: Negative for fever.  Skin: Positive for wound.  All other systems reviewed and are negative.  Physical Exam Updated Vital Signs BP 141/73 (BP Location: Right Arm)   Pulse 100   Temp 97.7 F (36.5 C) (Oral)   Resp 18   Ht 5\' 8"  (1.727 m)   Wt 147 lb (66.7 kg)   SpO2 98%   BMI 22.35 kg/m   Physical Exam  Constitutional: He is oriented to person, place, and time. He appears well-developed and well-nourished.  HENT:  Head: Normocephalic and atraumatic.  Eyes: Conjunctivae are normal.  Neck: Neck supple.  Cardiovascular: Normal rate and regular rhythm.   Pulmonary/Chest: Effort normal and breath sounds normal.    Abdominal: Soft. Bowel sounds are normal.  Musculoskeletal: Normal range of motion.  Neurological: He is alert and oriented to person, place, and time.  Skin: Skin is warm and dry.  Healing wound with eschar proximately 7cm x 4 cm.   Psychiatric: He has a normal mood and affect. His behavior is normal.  Nursing note and vitals reviewed.   ED Treatments / Results  DIAGNOSTIC STUDIES:  Oxygen Saturation is 98% on RA, normal by my interpretation.    COORDINATION OF CARE:  8:19 AM Discussed wound care and will have affected area wrapped. Discussed treatment plan with pt at bedside and pt agreed to plan.  Labs (all labs ordered are listed, but only abnormal results are displayed) Labs Reviewed - No data to display  EKG  EKG Interpretation None       Radiology No results found.  Procedures Procedures (including critical care time)  Medications Ordered in ED Medications - No data to display   Initial Impression / Assessment and Plan / ED Course  I have reviewed the triage vital signs and the nursing notes.  Pertinent labs & imaging results that were available during my care of the patient were reviewed by me and considered in my medical decision making (see chart for details).  Clinical Course     Wound on left lower extremity rechecked and stable.  Patient understands he can return anytime to be reevaluated.  Final Clinical Impressions(s) / ED Diagnoses   Final diagnoses:  Visit for wound check    New Prescriptions Discharge Medication List as of 12/23/2016  8:37 AM    I personally performed the services described in this documentation, which was scribed in my presence. The recorded information has been reviewed and is accurate.      Martin HutchingBrian Lucile Didonato, MD 12/26/16 [phone]

## 2016-12-23 NOTE — ED Triage Notes (Signed)
Pt reports was seen in ER on Thursday for evaluation after fall. Pt reports "hematoma was drained" and was told to come back for re-check. Pt denies any fever,chills, increased swelling or drainage.

## 2016-12-26 LAB — CULTURE, BLOOD (ROUTINE X 2)
CULTURE: NO GROWTH
CULTURE: NO GROWTH

## 2018-03-06 ENCOUNTER — Other Ambulatory Visit: Payer: Self-pay

## 2018-03-06 ENCOUNTER — Encounter (HOSPITAL_COMMUNITY): Payer: Self-pay | Admitting: Emergency Medicine

## 2018-03-06 ENCOUNTER — Emergency Department (HOSPITAL_COMMUNITY)
Admission: EM | Admit: 2018-03-06 | Discharge: 2018-03-06 | Disposition: A | Discharge status: Home / Self Care | Payer: Medicare Other | Attending: Emergency Medicine | Admitting: Emergency Medicine

## 2018-03-06 ENCOUNTER — Emergency Department (HOSPITAL_COMMUNITY): Payer: Medicare Other

## 2018-03-06 DIAGNOSIS — Z7984 Long term (current) use of oral hypoglycemic drugs: Secondary | ICD-10-CM | POA: Diagnosis not present

## 2018-03-06 DIAGNOSIS — I10 Essential (primary) hypertension: Secondary | ICD-10-CM | POA: Insufficient documentation

## 2018-03-06 DIAGNOSIS — K219 Gastro-esophageal reflux disease without esophagitis: Secondary | ICD-10-CM | POA: Insufficient documentation

## 2018-03-06 DIAGNOSIS — R109 Unspecified abdominal pain: Secondary | ICD-10-CM | POA: Insufficient documentation

## 2018-03-06 DIAGNOSIS — Z87891 Personal history of nicotine dependence: Secondary | ICD-10-CM | POA: Diagnosis not present

## 2018-03-06 DIAGNOSIS — R079 Chest pain, unspecified: Secondary | ICD-10-CM

## 2018-03-06 DIAGNOSIS — E119 Type 2 diabetes mellitus without complications: Secondary | ICD-10-CM | POA: Insufficient documentation

## 2018-03-06 DIAGNOSIS — Z7982 Long term (current) use of aspirin: Secondary | ICD-10-CM | POA: Diagnosis not present

## 2018-03-06 DIAGNOSIS — Z79899 Other long term (current) drug therapy: Secondary | ICD-10-CM | POA: Diagnosis not present

## 2018-03-06 LAB — COMPREHENSIVE METABOLIC PANEL
ALBUMIN: 4.1 g/dL (ref 3.5–5.0)
ALT: 18 U/L (ref 17–63)
AST: 27 U/L (ref 15–41)
Alkaline Phosphatase: 129 U/L — ABNORMAL HIGH (ref 38–126)
Anion gap: 14 (ref 5–15)
BILIRUBIN TOTAL: 0.7 mg/dL (ref 0.3–1.2)
BUN: 29 mg/dL — AB (ref 6–20)
CHLORIDE: 101 mmol/L (ref 101–111)
CO2: 23 mmol/L (ref 22–32)
Calcium: 9.3 mg/dL (ref 8.9–10.3)
Creatinine, Ser: 1.29 mg/dL — ABNORMAL HIGH (ref 0.61–1.24)
GFR calc Af Amer: 58 mL/min — ABNORMAL LOW (ref >60)
GFR calc non Af Amer: 50 mL/min — ABNORMAL LOW (ref >60)
GLUCOSE: 83 mg/dL (ref 65–99)
POTASSIUM: 4 mmol/L (ref 3.5–5.1)
Sodium: 138 mmol/L (ref 135–145)
Total Protein: 7.1 g/dL (ref 6.5–8.1)

## 2018-03-06 LAB — CBC WITH DIFFERENTIAL/PLATELET
BASOS ABS: 0 10*3/uL (ref 0.0–0.1)
BASOS PCT: 0 %
Eosinophils Absolute: 0.1 10*3/uL (ref 0.0–0.7)
Eosinophils Relative: 1 %
HEMATOCRIT: 42.9 % (ref 39.0–52.0)
Hemoglobin: 13.9 g/dL (ref 13.0–17.0)
Lymphocytes Relative: 19 %
Lymphs Abs: 1.6 10*3/uL (ref 0.7–4.0)
MCH: 31.4 pg (ref 26.0–34.0)
MCHC: 32.4 g/dL (ref 30.0–36.0)
MCV: 97.1 fL (ref 78.0–100.0)
MONO ABS: 0.7 10*3/uL (ref 0.1–1.0)
Monocytes Relative: 8 %
NEUTROS ABS: 5.9 10*3/uL (ref 1.7–7.7)
Neutrophils Relative %: 72 %
PLATELETS: 202 10*3/uL (ref 150–400)
RBC: 4.42 MIL/uL (ref 4.22–5.81)
RDW: 13.4 % (ref 11.5–15.5)
WBC: 8.2 10*3/uL (ref 4.0–10.5)

## 2018-03-06 LAB — LIPASE, BLOOD: Lipase: 39 U/L (ref 11–51)

## 2018-03-06 LAB — TROPONIN I: Troponin I: <0.03 ng/mL (ref <0.03)

## 2018-03-06 MED ORDER — GI COCKTAIL ~~LOC~~
30.0000 mL | Freq: Once | ORAL | Status: AC
  Administered 2018-03-06: 30 mL via ORAL
  Filled 2018-03-06: qty 30

## 2018-03-06 NOTE — Discharge Instructions (Signed)
Ultrasound revealed a small cyst on your kidney.  Your kidney function is slightly elevated.  Recommend over-the-counter reflux medication like Zantac or Pepcid.  Follow-up with your primary care doctor or gastroenterologist.

## 2018-03-06 NOTE — ED Provider Notes (Signed)
[facility] EMERGENCY DEPARTMENT Provider Note   CSN: [id number] Arrival date & time: 03/06/18  3335     History   Chief Complaint Chief Complaint  Patient presents with  . Chest Pain    HPI Radeen Kell. is a 84 y.o. male.  Right-sided chest pain without radiation described as a sharp pain earlier today.  Symptoms are associated with burping and eating a sausage biscuit.  He is concerned about his gallbladder.  No known history of heart disease.  Past medical history includes diabetes, hypertension, GERD.  Severity of his mild.      Past Medical History:  Diagnosis Date  . Diabetes mellitus without complication (HCC)    over 10 yrs  . GERD (gastroesophageal reflux disease)   . Hypertension   . Kidney stones     Patient Active Problem List   Diagnosis Date Noted  . Unspecified constipation 07/22/2014  . Diabetes (HCC) 07/22/2014  . Essential hypertension, benign 07/22/2014  . GERD (gastroesophageal reflux disease) 07/22/2014    Past Surgical History:  Procedure Laterality Date  . HEMORRHOID SURGERY    . KIDNEY STONE SURGERY    . TONSILLECTOMY         Home Medications    Prior to Admission medications   Medication Sig Start Date End Date Taking? Authorizing Provider  aspirin EC 81 MG tablet Take 81 mg by mouth daily.   Yes [provider]  Cholecalciferol (VITAMIN D) 2000 units CAPS Take 1 capsule by mouth daily.   Yes [provider]  Ginkgo Biloba (GINKOBA PO) Take by mouth.   Yes [provider]  Magnesium Hydroxide (MAGNESIA PO) Take 1 capsule by mouth daily.   Yes [provider]  metFORMIN (GLUCOPHAGE) 500 MG tablet Take 500 mg by mouth 2 (two) times daily with a meal.   Yes [provider]  omeprazole (PRILOSEC) 20 MG capsule Take 20 mg by mouth daily.   Yes [provider]  ramipril (ALTACE) 2.5 MG capsule Take 2.5 mg by mouth daily.   Yes [provider]  Saw Palmetto, Serenoa  repens, (SAW PALMETTO PO) Take 1 capsule by mouth daily.   Yes [provider]  vitamin B-12 (CYANOCOBALAMIN) 1000 MCG tablet Take 1,000 mcg by mouth daily.    Yes [provider]  vitamin E 400 UNIT capsule Take 400 Units by mouth daily.   Yes [provider]    Family History History reviewed. No pertinent family history.  Social History Social History   Tobacco Use  . Smoking status: Former Smoker    Types: Cigarettes  . Smokeless tobacco: Never Used  . Tobacco comment: quit 2006  Substance Use Topics  . Alcohol use: No  . Drug use: No     Allergies   Sulfa antibiotics   Review of Systems Review of Systems  All other systems reviewed and are negative.    Physical Exam Updated Vital Signs BP 132/87   Pulse 76   Temp 97.9 F (36.6 C)   Resp 16   Ht 5\' 10"  (1.778 m)   Wt 65.8 kg (145 lb)   SpO2 99%   BMI 20.81 kg/m   Physical Exam  Constitutional: He is oriented to person, place, and time. He appears well-developed and well-nourished.  HENT:  Head: Normocephalic and atraumatic.  Eyes: Conjunctivae are normal.  Neck: Neck supple.  Cardiovascular: Normal rate and regular rhythm.  Pulmonary/Chest: Effort normal and breath sounds normal.  Tender to palpation  right anterior chest wall  Abdominal: Soft. Bowel sounds are normal.  Musculoskeletal: Normal range of motion.  Neurological: He is alert and oriented to person, place, and time.  Skin: Skin is warm and dry.  Psychiatric: He has a normal mood and affect. His behavior is normal.  Nursing note and vitals reviewed.    ED Treatments / Results  Labs (all labs ordered are listed, but only abnormal results are displayed) Labs Reviewed  COMPREHENSIVE METABOLIC PANEL - Abnormal; Notable for the following components:      Result Value   BUN 29 (*)    Creatinine, Ser 1.29 (*)    Alkaline Phosphatase 129 (*)    GFR calc non Af Amer 50 (*)    GFR calc Af Amer 58 (*)    All other  components within normal limits  CBC WITH DIFFERENTIAL/PLATELET  TROPONIN I  LIPASE, BLOOD    EKG  EKG Interpretation  Date/Time:  Thursday March 06 2018 10:04:22 EST Ventricular Rate:  84 PR Interval:    QRS Duration: 94 QT Interval:  364 QTC Calculation: 431 R Axis:   24 Text Interpretation:  Sinus rhythm Atrial premature complex Confirmed by Donnetta Hutching ([id number]) on 03/06/2018 10:19:58 AM       Radiology US Abdomen Limited Ruq  Result Date: 03/06/2018 CLINICAL DATA:  Right upper quadrant pain EXAM: ULTRASOUND ABDOMEN LIMITED RIGHT UPPER QUADRANT COMPARISON:  None. FINDINGS: Gallbladder: No gallstones or wall thickening visualized. No sonographic Murphy sign noted by sonographer. Common bile duct: Diameter: Normal caliber, 4 mm Liver: No focal lesion identified. Within normal limits in parenchymal echogenicity. Portal vein is patent on color Doppler imaging with normal direction of blood flow towards the liver. Incidentally noted is a 9.4 cm simple appearing right upper pole renal cyst. IMPRESSION: No acute findings. Large right upper pole renal cyst appears simple. Electronically Signed   By: Charlett Nose M.D.   On: 03/06/2018 11:32    Procedures Procedures (including critical care time)  Medications Ordered in ED Medications  gi cocktail (Maalox,Lidocaine,Donnatal) (30 mLs Oral Given 03/06/18 1144)     Initial Impression / Assessment and Plan / ED Course  I have reviewed the triage vital signs and the nursing notes.  Pertinent labs & imaging results that were available during my care of the patient were reviewed by me and considered in my medical decision making (see chart for details).     History and physical more consistent with chest wall pain.  He is in no acute distress.  Right upper quadrant ultrasound negative for gallbladder disease.  He does have a large right upper pole renal cyst.  Screening labs, EKG, all negative.  Creatinine slightly elevated.  Test results  discussed with patient and his wife.  He will take over-the-counter reflux medication.  Follow-up with his primary care doctor.  Final Clinical Impressions(s) / ED Diagnoses   Final diagnoses:  Abdominal pain  Chest pain, unspecified type  Gastroesophageal reflux disease without esophagitis    ED Discharge Orders    None       Donnetta Hutching, MD 03/06/18 1435

## 2018-03-06 NOTE — ED Triage Notes (Signed)
Patient c/o right sided chest pain. Denies any radiation in pain, shortness of breath, nausea, or dizziness. Per patient belching "a lot." denies any cardiac hx other than heart murmur.

## 2018-05-08 ENCOUNTER — Ambulatory Visit (INDEPENDENT_AMBULATORY_CARE_PROVIDER_SITE_OTHER): Payer: Medicare Other | Admitting: Orthopaedic Surgery

## 2018-05-08 ENCOUNTER — Encounter (INDEPENDENT_AMBULATORY_CARE_PROVIDER_SITE_OTHER): Payer: Self-pay | Admitting: Orthopaedic Surgery

## 2018-05-08 DIAGNOSIS — M4807 Spinal stenosis, lumbosacral region: Secondary | ICD-10-CM | POA: Diagnosis not present

## 2018-05-12 ENCOUNTER — Encounter (INDEPENDENT_AMBULATORY_CARE_PROVIDER_SITE_OTHER): Payer: Self-pay | Admitting: Orthopaedic Surgery

## 2018-05-12 NOTE — Progress Notes (Signed)
Office Visit Note   Patient: Martin Frazier.           Date of Birth: 1934-02-12           MRN: [id number] Visit Date: 05/08/2018              Requested by: Kela Millin, MD [address] [place], [place] [address] PCP: Kela Millin, MD   Assessment & Plan: Visit Diagnoses:  1. Spinal stenosis of lumbosacral region     Plan: We will proceed with MRI scan lumbar spine for evaluation of his neurogenic claudication symptoms with spinal stenosis.  Radiographs 04/16/2018 showed transitional anatomy remnant.  L5-S1 anterolisthesis with collapse of disc space height.  Scoliosis with degenerative facet changes.  We will proceed with MRI scan office follow-up after imaging.  Follow-Up Instructions: retrurn after lumbar MRI  Orders:  Orders Placed This Encounter  Procedures  . MR Lumbar Spine w/o contrast   No orders of the defined types were placed in this encounter.     Procedures: No procedures performed   Clinical Data: No additional findings.   Subjective: Chief Complaint  Patient presents with  . Lower Back - Pain    HPI 84 year old male with chronic back pain and claudication symptoms with ambulation.  He has difficulty extended periods of time with back and leg weakness.  He does better if he is leaning on a grocery cart.  No associated bowel or bladder symptoms.  He has had problems with GERD in the past also on specific constipation.  Review of Systems 14 point review of system positive for diabetes over 10 years, GERD, hypertension, history of kidney stones.  Previous hemorrhoid surgery kidney stone surgery tonsillectomy in the distant past.  He has known scoliosis.  Lumbar x-rays 04/16/2018 showed collapse of L5-S1 with grade 2 anterolisthesis.  Lumbar scoliosis with right lumbar curve with degenerative facets.  Patient is a former smoker quit 2006.  Objective: Vital Signs: BP 137/64   Pulse 88   Ht 5\' 6"  (1.676 m)   Wt 145 lb (65.8 kg)   BMI  23.40 kg/m   Physical Exam  Constitutional: He is oriented to person, place, and time. He appears well-developed and well-nourished.  HENT:  Head: Normocephalic and atraumatic.  Eyes: Pupils are equal, round, and reactive to light. EOM are normal.  Neck: No tracheal deviation present. No thyromegaly present.  Cardiovascular: Normal rate.  Pulmonary/Chest: Effort normal. He has no wheezes.  Abdominal: Soft. Bowel sounds are normal.  Neurological: He is alert and oriented to person, place, and time.  Skin: Skin is warm and dry. Capillary refill takes less than 2 seconds.  Psychiatric: He has a normal mood and affect. His behavior is normal. Judgment and thought content normal.    Ortho Exam patient has negative logroll to the hips negative straight leg raising 90 degrees.  Knee and ankle jerk are 1+ and symmetrical.  Solis weakness.  Good capillary refill to the feet.  Palpable pedal pulses.  No rash over exposed skin no venous stasis changes.  Specialty Comments:  No specialty comments available.  Imaging: Dextroscoliosis with L5-S1 disc narrowing facet hypertrophy and anterolisthesis.  PAD noted with atherosclerosis and calcification of the abdominal aorta.  Negative for acute fracture.  Images on 04/16/2018.   PMFS History: Patient Active Problem List   Diagnosis Date Noted  . Unspecified constipation 07/22/2014  . Diabetes (HCC) 07/22/2014  . Essential hypertension, benign 07/22/2014  . GERD (  gastroesophageal reflux disease) 07/22/2014   Past Medical History:  Diagnosis Date  . Diabetes mellitus without complication (HCC)    over 10 yrs  . GERD (gastroesophageal reflux disease)   . Hypertension   . Kidney stones     No family history on file.  Past Surgical History:  Procedure Laterality Date  . HEMORRHOID SURGERY    . KIDNEY STONE SURGERY    . TONSILLECTOMY     Social History   Occupational History  . Not on file  Tobacco Use  . Smoking status: Former Smoker     Types: Cigarettes  . Smokeless tobacco: Never Used  . Tobacco comment: quit 2006  Substance and Sexual Activity  . Alcohol use: No  . Drug use: No  . Sexual activity: Not on file

## 2018-05-13 ENCOUNTER — Encounter: Payer: Self-pay | Admitting: Orthopaedic Surgery

## 2018-05-15 ENCOUNTER — Encounter (INDEPENDENT_AMBULATORY_CARE_PROVIDER_SITE_OTHER): Payer: Self-pay | Admitting: Orthopaedic Surgery

## 2018-05-15 ENCOUNTER — Ambulatory Visit (INDEPENDENT_AMBULATORY_CARE_PROVIDER_SITE_OTHER): Payer: Medicare Other | Admitting: Orthopaedic Surgery

## 2018-05-15 VITALS — BP 119/67 | HR 81 | Ht 68.0 in | Wt 145.0 lb

## 2018-05-15 DIAGNOSIS — M48062 Spinal stenosis, lumbar region with neurogenic claudication: Secondary | ICD-10-CM

## 2018-05-15 NOTE — Progress Notes (Signed)
Office Visit Note   Patient: Martin Frazier.           Date of Birth: Feb 15, 1934           MRN: [id number] Visit Date: 05/15/2018              Requested by: Kela Millin, MD [address] [place], [place] [address] PCP: Kela Millin, MD   Assessment & Plan: Visit Diagnoses:  1. Spinal stenosis of lumbar region with neurogenic claudication     Plan: Patient has multilevel changes with severe stenosis at L4-5.  He has degenerative anterolisthesis at L4-5 and L5-S1.  Multilevel changes with multilevel foraminal stenosis.  Patient has an S1-S2 disc with transitional anatomy.  At present his symptoms are not severe enough to consider operative intervention.  He can return in 2 months.  Follow-Up Instructions: Return in about 2 months (around 07/15/2018).   Orders:  No orders of the defined types were placed in this encounter.  No orders of the defined types were placed in this encounter.     Procedures: No procedures performed   Clinical Data: No additional findings.   Subjective: Chief Complaint  Patient presents with  . Lower Back - Pain, Follow-up    MRI Lumbar review    HPI 84 year old male returns with chronic back pain claudication symptoms with ambulation.  He is better with rest does better leaning over a grocery cart.  No bowel bladder symptoms.  He still ambulates but asked to sit and rest when he goes to a store.  MRI scan is available for review.  Patient is a former smoker.  He quit in 2006.  Review of Systems updated unchanged from 05/08/2018 office visit.   Objective: Vital Signs: BP 119/67   Pulse 81   Ht 5\' 8"  (1.727 m)   Wt 145 lb (65.8 kg)   BMI 22.05 kg/m   Physical Exam  Musculoskeletal:  Scoliosis.  Patient gets from sitting to standing and can ambulate.    Ortho Exam patient has negative logroll to the hips.  Negative straight leg raising 90 degrees right and left.  He can heel and toe walk.  Good capillary refill to the feet  with palpable pedal pulses.  No venous stasis changes no lower extremity edema.  Knee and ankle jerk are 1+ and symmetrical.  Specialty Comments:  No specialty comments available.  Imaging: MRI scan 05/18/2018 shows diffuse degenerative disease with dextroscoliosis.  Facet mediated L4-5 and L5-S1 anterolisthesis.  L2-3 severe left foraminal impingement moderate left subarticular recess narrowing.  L3-4 moderate left foraminal and subarticular recess stenosis.  L4-5 right more than left foraminal impingement high-grade spinal stenosis at L4-5 centrally.  L5-S1 by foraminal impingement that is worse on the right.   PMFS History: Patient Active Problem List   Diagnosis Date Noted  . Unspecified constipation 07/22/2014  . Diabetes (HCC) 07/22/2014  . Essential hypertension, benign 07/22/2014  . GERD (gastroesophageal reflux disease) 07/22/2014   Past Medical History:  Diagnosis Date  . Diabetes mellitus without complication (HCC)    over 10 yrs  . GERD (gastroesophageal reflux disease)   . Hypertension   . Kidney stones     No family history on file.  Past Surgical History:  Procedure Laterality Date  . HEMORRHOID SURGERY    . KIDNEY STONE SURGERY    . TONSILLECTOMY     Social History   Occupational History  . Not on file  Tobacco Use  .  Smoking status: Former Smoker    Types: Cigarettes  . Smokeless tobacco: Never Used  . Tobacco comment: quit 2006  Substance and Sexual Activity  . Alcohol use: No  . Drug use: No  . Sexual activity: Not on file

## 2018-05-21 ENCOUNTER — Encounter (INDEPENDENT_AMBULATORY_CARE_PROVIDER_SITE_OTHER): Payer: Self-pay | Admitting: Orthopaedic Surgery

## 2018-05-21 DIAGNOSIS — M48062 Spinal stenosis, lumbar region with neurogenic claudication: Secondary | ICD-10-CM | POA: Insufficient documentation

## 2018-08-21 ENCOUNTER — Ambulatory Visit (INDEPENDENT_AMBULATORY_CARE_PROVIDER_SITE_OTHER): Payer: Medicare Other | Admitting: Orthopaedic Surgery

## 2018-08-21 ENCOUNTER — Encounter (INDEPENDENT_AMBULATORY_CARE_PROVIDER_SITE_OTHER): Payer: Self-pay | Admitting: Orthopaedic Surgery

## 2018-08-21 VITALS — BP 147/81 | HR 80 | Ht 67.0 in | Wt 145.0 lb

## 2018-08-21 DIAGNOSIS — M48062 Spinal stenosis, lumbar region with neurogenic claudication: Secondary | ICD-10-CM

## 2018-08-22 ENCOUNTER — Encounter (INDEPENDENT_AMBULATORY_CARE_PROVIDER_SITE_OTHER): Payer: Self-pay | Admitting: Orthopaedic Surgery

## 2018-08-22 NOTE — Progress Notes (Signed)
Office Visit Note   Patient: Martin Frazier.           Date of Birth: November 20, 1934           MRN: [id number] Visit Date: 08/21/2018              Requested by: Kela Millin, MD [address] [place], [place] [address] PCP: Kela Millin, MD   Assessment & Plan: Visit Diagnoses:  1. Spinal stenosis of lumbar region with neurogenic claudication     Plan: We reviewed MRI scan, diagnosis of spinal stenosis neurogenic claudication and discussed single level decompression surgery at the L4-5 level for improvement in his symptoms.  Radiology report reviewed.  We discussed lumbar decompression at the L4-5 level.  He need preoperative medical clearance with his diabetes and hypertension.  Risks of surgery discussed including reoperation, potential for need for fusion, anesthetic risks.  Questions were elicited and answered.  He can call if he like to proceed or return to discuss it further.  Follow-Up Instructions: No follow-ups on file.   Orders:  No orders of the defined types were placed in this encounter.  No orders of the defined types were placed in this encounter.     Procedures: No procedures performed   Clinical Data: No additional findings.   Subjective: Chief Complaint  Patient presents with  . Lower Back - Pain, Follow-up    HPI 84 year old male returns for follow-up of low back pain with neurogenic claudication symptoms.  He does not go to the store he has to walk short distance to stop sit and rest he does better leaning on a grocery cart.  As he walks he begins to get cramping pain in his legs.  He has had arterial studies which were unchanged and stable done within the last few months.  Plain radiographs of shown anterolisthesis at L4-5 and L5-S1.  He has some transitional anatomy at lumbosacral junction.  At L4-5 he has severe stenosis consistent with his symptoms.  Wife is with him today to add additional history.  He no longer go shopping with her due to  his claudication symptoms.  He can walk to the grocery store if he leans on a cart.  He gets relief with sitting.  Review of Systems 14 point review of systems updated positive for diabetes x10 years, hypertension, GERD, history of kidney stones.  Scoliosis.  PAD L4-5 severe stenosis.  Decreased hearing acuity.  Otherwise negative as pertains HPI.   Objective: Vital Signs: BP (!) 147/81   Pulse 80   Ht 5\' 7"  (1.702 m)   Wt 145 lb (65.8 kg)   BMI 22.71 kg/m   Physical Exam  Constitutional: He is oriented to person, place, and time. He appears well-developed and well-nourished.  HENT:  Head: Normocephalic and atraumatic.  Eyes: Pupils are equal, round, and reactive to light. EOM are normal.  Neck: No tracheal deviation present. No thyromegaly present.  Cardiovascular: Normal rate.  Pulmonary/Chest: Effort normal. He has no wheezes.  Abdominal: Soft. Bowel sounds are normal.  Neurological: He is alert and oriented to person, place, and time.  Skin: Skin is warm and dry. Capillary refill takes less than 2 seconds.  Psychiatric: He has a normal mood and affect. His behavior is normal. Judgment and thought content normal.    Ortho Exam patient has negative logroll to the hips.  Normal heel toe gait.  Knee and ankle jerk are 1+ and symmetrical.  No gastrocsoleus  weakness.  Anterior tib EHL is strong.  Specialty Comments:  No specialty comments available.  Imaging: No results found.   PMFS History: Patient Active Problem List   Diagnosis Date Noted  . Spinal stenosis of lumbar region with neurogenic claudication 05/21/2018  . Unspecified constipation 07/22/2014  . Diabetes (HCC) 07/22/2014  . Essential hypertension, benign 07/22/2014  . GERD (gastroesophageal reflux disease) 07/22/2014   Past Medical History:  Diagnosis Date  . Diabetes mellitus without complication (HCC)    over 10 yrs  . GERD (gastroesophageal reflux disease)   . Hypertension   . Kidney stones     No  family history on file.  Past Surgical History:  Procedure Laterality Date  . HEMORRHOID SURGERY    . KIDNEY STONE SURGERY    . TONSILLECTOMY     Social History   Occupational History  . Not on file  Tobacco Use  . Smoking status: Former Smoker    Types: Cigarettes  . Smokeless tobacco: Never Used  . Tobacco comment: quit 2006  Substance and Sexual Activity  . Alcohol use: No  . Drug use: No  . Sexual activity: Not on file

## 2018-10-23 ENCOUNTER — Telehealth (INDEPENDENT_AMBULATORY_CARE_PROVIDER_SITE_OTHER): Payer: Self-pay | Admitting: Orthopaedic Surgery

## 2018-10-23 NOTE — Telephone Encounter (Signed)
Left message on voice mail [phone]) providing my name and direct number for scheduling L-spine surgery.

## 2019-01-30 ENCOUNTER — Encounter (HOSPITAL_COMMUNITY): Payer: Self-pay

## 2019-01-30 ENCOUNTER — Other Ambulatory Visit: Payer: Self-pay

## 2019-01-30 ENCOUNTER — Emergency Department (HOSPITAL_COMMUNITY): Payer: Medicare Other

## 2019-01-30 ENCOUNTER — Emergency Department (HOSPITAL_COMMUNITY)
Admission: EM | Admit: 2019-01-30 | Discharge: 2019-01-30 | Disposition: A | Discharge status: Home / Self Care | Payer: Medicare Other | Attending: Emergency Medicine | Admitting: Emergency Medicine

## 2019-01-30 DIAGNOSIS — R1011 Right upper quadrant pain: Secondary | ICD-10-CM | POA: Diagnosis not present

## 2019-01-30 DIAGNOSIS — Z87891 Personal history of nicotine dependence: Secondary | ICD-10-CM | POA: Diagnosis not present

## 2019-01-30 DIAGNOSIS — I10 Essential (primary) hypertension: Secondary | ICD-10-CM | POA: Diagnosis not present

## 2019-01-30 DIAGNOSIS — E119 Type 2 diabetes mellitus without complications: Secondary | ICD-10-CM | POA: Diagnosis not present

## 2019-01-30 DIAGNOSIS — Z7984 Long term (current) use of oral hypoglycemic drugs: Secondary | ICD-10-CM | POA: Diagnosis not present

## 2019-01-30 DIAGNOSIS — Z79899 Other long term (current) drug therapy: Secondary | ICD-10-CM | POA: Insufficient documentation

## 2019-01-30 DIAGNOSIS — Z7982 Long term (current) use of aspirin: Secondary | ICD-10-CM | POA: Diagnosis not present

## 2019-01-30 DIAGNOSIS — R109 Unspecified abdominal pain: Secondary | ICD-10-CM

## 2019-01-30 LAB — COMPREHENSIVE METABOLIC PANEL
ALT: 17 U/L (ref 0–44)
AST: 20 U/L (ref 15–41)
Albumin: 4.2 g/dL (ref 3.5–5.0)
Alkaline Phosphatase: 132 U/L — ABNORMAL HIGH (ref 38–126)
Anion gap: 7 (ref 5–15)
BUN: 34 mg/dL — ABNORMAL HIGH (ref 8–23)
CHLORIDE: 103 mmol/L (ref 98–111)
CO2: 27 mmol/L (ref 22–32)
Calcium: 9.2 mg/dL (ref 8.9–10.3)
Creatinine, Ser: 1.46 mg/dL — ABNORMAL HIGH (ref 0.61–1.24)
GFR calc Af Amer: 51 mL/min — ABNORMAL LOW (ref >60)
GFR calc non Af Amer: 44 mL/min — ABNORMAL LOW (ref >60)
Glucose, Bld: 216 mg/dL — ABNORMAL HIGH (ref 70–99)
Potassium: 4.7 mmol/L (ref 3.5–5.1)
Sodium: 137 mmol/L (ref 135–145)
Total Bilirubin: 0.5 mg/dL (ref 0.3–1.2)
Total Protein: 6.8 g/dL (ref 6.5–8.1)

## 2019-01-30 LAB — URINALYSIS, ROUTINE W REFLEX MICROSCOPIC
Bacteria, UA: NONE SEEN
Bilirubin Urine: NEGATIVE
Glucose, UA: 50 mg/dL — AB
Ketones, ur: NEGATIVE mg/dL
Leukocytes, UA: NEGATIVE
Nitrite: NEGATIVE
PH: 6 (ref 5.0–8.0)
Protein, ur: NEGATIVE mg/dL
Specific Gravity, Urine: 1.018 (ref 1.005–1.030)

## 2019-01-30 LAB — TROPONIN I: Troponin I: <0.03 ng/mL (ref <0.03)

## 2019-01-30 LAB — CBC
HCT: 42.7 % (ref 39.0–52.0)
Hemoglobin: 13.8 g/dL (ref 13.0–17.0)
MCH: 32.2 pg (ref 26.0–34.0)
MCHC: 32.3 g/dL (ref 30.0–36.0)
MCV: 99.8 fL (ref 80.0–100.0)
Platelets: 205 10*3/uL (ref 150–400)
RBC: 4.28 MIL/uL (ref 4.22–5.81)
RDW: 12.1 % (ref 11.5–15.5)
WBC: 8.3 10*3/uL (ref 4.0–10.5)
nRBC: 0 % (ref 0.0–0.2)

## 2019-01-30 LAB — LIPASE, BLOOD: LIPASE: 47 U/L (ref 11–51)

## 2019-01-30 MED ORDER — SODIUM CHLORIDE 0.9 % IV BOLUS: 500.0000 mL | Freq: Once | INTRAVENOUS | Status: DC

## 2019-01-30 NOTE — ED Provider Notes (Signed)
[facility] EMERGENCY DEPARTMENT Provider Note   CSN: [id number] Arrival date & time: 01/30/19  1400     History   Chief Complaint Chief Complaint  Patient presents with  . Abdominal Pain    HPI Martin Frazier. is a 85 y.o. male.  HPI  Pt was seen at 1850.  Per pt and his wife, c/o gradual onset and persistence of constant RUQ abd "pain" for the past 1 week. Has been associated with no other symptoms. Describes the abd pain as "sore." States the pain began 1 week ago "a few hours after eating" while he "was walking outside to move around some wood."  Pt states the pain worsens "when I move around" and mildly improves, but does not go away, when he stays still. Pt was evaluated by his PMD 4 days ago, had a barium swallow completed, and was told he had a hiatal hernia, GERD, and mild aspiration.  Denies injury, no N/V, no diarrhea, no fevers, no back pain, no rash, no CP/SOB, no black or blood in stools.      Past Medical History:  Diagnosis Date  . Diabetes mellitus without complication (HCC)    over 10 yrs  . GERD (gastroesophageal reflux disease)   . Hypertension   . Kidney stones     Patient Active Problem List   Diagnosis Date Noted  . Spinal stenosis of lumbar region with neurogenic claudication 05/21/2018  . Unspecified constipation 07/22/2014  . Diabetes (HCC) 07/22/2014  . Essential hypertension, benign 07/22/2014  . GERD (gastroesophageal reflux disease) 07/22/2014    Past Surgical History:  Procedure Laterality Date  . HEMORRHOID SURGERY    . KIDNEY STONE SURGERY    . TONSILLECTOMY          Home Medications    Prior to Admission medications   Medication Sig Start Date End Date Taking? Authorizing Provider  aspirin EC 81 MG tablet Take 81 mg by mouth daily.    [provider]  Cholecalciferol (D 400) 400 units CHEW Chew by mouth.    [provider]  Cholecalciferol (VITAMIN D) 2000 units CAPS Take 1 capsule by mouth daily.     [provider]  diclofenac sodium (VOLTAREN) 1 % GEL apply TWO grams TO AFFECTED AREA(S) FOUR TIMES DAILY 03/25/18   [provider]  Ginkgo Biloba (GINKOBA PO) Take by mouth.    [provider]  glipiZIDE (GLUCOTROL) 5 MG tablet Take by mouth.    [provider]  Magnesium Gluconate 550 MG TABS Take by mouth.    [provider]  Magnesium Hydroxide (MAGNESIA PO) Take 1 capsule by mouth daily.    [provider]  metFORMIN (GLUCOPHAGE) 500 MG tablet Take 500 mg by mouth 2 (two) times daily with a meal.    [provider]  omeprazole (PRILOSEC) 20 MG capsule Take 20 mg by mouth daily.    [provider]  ramipril (ALTACE) 2.5 MG capsule Take 2.5 mg by mouth daily.    [provider]  Saw Palmetto, Serenoa repens, (SAW PALMETTO PO) Take 1 capsule by mouth daily.    [provider]  vitamin B-12 (CYANOCOBALAMIN) 1000 MCG tablet Take 1,000 mcg by mouth daily.     [provider]  vitamin E 400 UNIT capsule Take 400 Units by mouth daily.    [provider]    Family History No family history on file.  Social History Social History   Tobacco Use  .  Smoking status: Former Smoker    Types: Cigarettes  . Smokeless tobacco: Never Used  . Tobacco comment: quit 2006  Substance Use Topics  . Alcohol use: No  . Drug use: No     Allergies   Sulfa antibiotics   Review of Systems Review of Systems ROS: Statement: All systems negative except as marked or noted in the HPI; Constitutional: Negative for fever and chills. ; ; Eyes: Negative for eye pain, redness and discharge. ; ; ENMT: Negative for ear pain, hoarseness, nasal congestion, sinus pressure and sore throat. ; ; Cardiovascular: Negative for chest pain, palpitations, diaphoresis, dyspnea and peripheral edema. ; ; Respiratory: Negative for cough, wheezing and stridor. ; ; Gastrointestinal: +abd pain. Negative for nausea, vomiting,  diarrhea, blood in stool, hematemesis, jaundice and rectal bleeding. . ; ; Genitourinary: Negative for dysuria, flank pain and hematuria. ; ; Musculoskeletal: Negative for back pain and neck pain. Negative for swelling and trauma.; ; Skin: Negative for pruritus, rash, abrasions, blisters, bruising and skin lesion.; ; Neuro: Negative for headache, lightheadedness and neck stiffness. Negative for weakness, altered level of consciousness, altered mental status, extremity weakness, paresthesias, involuntary movement, seizure and syncope.       Physical Exam Updated Vital Signs BP 134/69 (BP Location: Right Arm)   Pulse 81   Temp 97.7 F (36.5 C) (Oral)   Resp 18   Ht 5\' 10"  (1.778 m)   Wt 65.8 kg   SpO2 100%   BMI 20.81 kg/m   Physical Exam 1855: Physical examination:  Nursing notes reviewed; Vital signs and O2 SAT reviewed;  Constitutional: Well developed, Well nourished, Well hydrated, In no acute distress; Head:  Normocephalic, atraumatic; Eyes: EOMI, PERRL, No scleral icterus; ENMT: Mouth and pharynx normal, Mucous membranes moist; Neck: Supple, Full range of motion, No lymphadenopathy; Cardiovascular: Regular rate and rhythm, No gallop; Respiratory: Breath sounds clear & equal bilaterally, No wheezes.  Speaking full sentences with ease, Normal respiratory effort/excursion; Chest: Nontender, Movement normal; Abdomen: Soft, +RUQ tenderness to palp. No rebound or guarding. Nondistended, Normal bowel sounds; Genitourinary: No CVA tenderness; Extremities: Peripheral pulses normal, No tenderness, No edema, No calf edema or asymmetry.; Neuro: AA&Ox3, Major CN grossly intact.  Speech clear. No gross focal motor or sensory deficits in extremities.; Skin: Color normal, Warm, Dry.   ED Treatments / Results  Labs (all labs ordered are listed, but only abnormal results are displayed)   EKG None  Radiology   Procedures Procedures (including critical care time)  Medications Ordered in  ED Medications  sodium chloride 0.9 % bolus 500 mL (has no administration in time range)     Initial Impression / Assessment and Plan / ED Course  I have reviewed the triage vital signs and the nursing notes.  Pertinent labs & imaging results that were available during my care of the patient were reviewed by me and considered in my medical decision making (see chart for details).  MDM Reviewed: previous chart, nursing note and vitals Reviewed previous: labs and ECG Interpretation: labs, ECG, x-ray and CT scan   Results for orders placed or performed during the hospital encounter of 01/30/19  Lipase, blood  Result Value Ref Range   Lipase 47 11 - 51 U/L  Comprehensive metabolic panel  Result Value Ref Range   Sodium 137 135 - 145 mmol/L   Potassium 4.7 3.5 - 5.1 mmol/L   Chloride 103 98 - 111 mmol/L   CO2 27 22 - 32 mmol/L   Glucose, Bld 216 (  H) 70 - 99 mg/dL   BUN 34 (H) 8 - 23 mg/dL   Creatinine, Ser 2.95 (H) 0.61 - 1.24 mg/dL   Calcium 9.2 8.9 - 62.1 mg/dL   Total Protein 6.8 6.5 - 8.1 g/dL   Albumin 4.2 3.5 - 5.0 g/dL   AST 20 15 - 41 U/L   ALT 17 0 - 44 U/L   Alkaline Phosphatase 132 (H) 38 - 126 U/L   Total Bilirubin 0.5 0.3 - 1.2 mg/dL   GFR calc non Af Amer 44 (L) >60 mL/min   GFR calc Af Amer 51 (L) >60 mL/min   Anion gap 7 5 - 15  CBC  Result Value Ref Range   WBC 8.3 4.0 - 10.5 K/uL   RBC 4.28 4.22 - 5.81 MIL/uL   Hemoglobin 13.8 13.0 - 17.0 g/dL   HCT 30.8 65.7 - 84.6 %   MCV 99.8 80.0 - 100.0 fL   MCH 32.2 26.0 - 34.0 pg   MCHC 32.3 30.0 - 36.0 g/dL   RDW 96.2 95.2 - 84.1 %   Platelets 205 150 - 400 K/uL   nRBC 0.0 0.0 - 0.2 %  Urinalysis, Routine w reflex microscopic  Result Value Ref Range   Color, Urine YELLOW YELLOW   APPearance CLEAR CLEAR   Specific Gravity, Urine 1.018 1.005 - 1.030   pH 6.0 5.0 - 8.0   Glucose, UA 50 (A) NEGATIVE mg/dL   Hgb urine dipstick SMALL (A) NEGATIVE   Bilirubin Urine NEGATIVE NEGATIVE   Ketones, ur NEGATIVE  NEGATIVE mg/dL   Protein, ur NEGATIVE NEGATIVE mg/dL   Nitrite NEGATIVE NEGATIVE   Leukocytes, UA NEGATIVE NEGATIVE   RBC / HPF 0-5 0 - 5 RBC/hpf   WBC, UA 0-5 0 - 5 WBC/hpf   Bacteria, UA NONE SEEN NONE SEEN   Mucus PRESENT   Troponin I - Once  Result Value Ref Range   Troponin I <0.03 <0.03 ng/mL   Dg Chest 2 View Result Date: 01/30/2019 CLINICAL DATA:  Mid abdominal pain. Hiatal hernia and reflux. Patient had a barium swallow study EXAM: CHEST - 2 VIEW COMPARISON:  Chest CT and CXR dated 12/31/2013 FINDINGS: Stable mild cardiomegaly with moderate aortic atherosclerosis at the arch. Emphysematous hyperinflation of the lungs without acute pulmonary consolidation, pulmonary edema, effusion or pneumothorax is identified. Mild thoracic spondylosis is noted without acute osseous appearing abnormality. Stable mild midthoracic height loss of what appears to be T8. IMPRESSION: 1. Emphysematous hyperinflation of the lungs without acute pulmonary disease. 2. Stable cardiomegaly with moderate aortic atherosclerosis. Electronically Signed   By: Tollie Eth M.D.   On: 01/30/2019 20:15   Dg Abd 2 Views Result Date: 01/30/2019 CLINICAL DATA:  Mid abdominal pain. EXAM: ABDOMEN - 2 VIEW COMPARISON:  01/30/2019 CT FINDINGS: Enteric contrast is seen within nonobstructed, nondistended appearing large bowel with scattered colonic diverticulosis noted along the ascending and transverse colon. No bowel obstruction is seen. Thoracolumbar spondylosis with levoscoliosis of the mid lumbar spine is visualized. No organomegaly nor radiopaque calculi is identified. Enteric contrast is seen from cecum to proximal rectum. IMPRESSION: No bowel obstruction. Colonic diverticulosis. Large amount retained oral contrast is seen within the colon from cecum to rectum. Electronically Signed   By: Tollie Eth M.D.   On: 01/30/2019 20:30   Ct No Charge Result Date: 01/30/2019 CLINICAL DATA:  Patient was referred for CT of the abdomen and  pelvis for evaluation of abdominal pain. EXAM: CT ADDITIONAL VIEWS AT NO CHARGE TECHNIQUE:  Abdominal scout and representative axial CT images were obtained. CONTRAST:  None. COMPARISON:  Esophagram 01/28/2019 FINDINGS: The patient has significant residual contrast material throughout the colon resulting in severe streak artifact on the representative image obtained. CT examination would be expected to be nondiagnostic. Recommend deferring CT scan until colonic cleansing. IMPRESSION: The patient has significant residual contrast material throughout the colon resulting in severe streak artifact on the representative image obtained. CT examination would be expected to be nondiagnostic and should be deferred until after colonic cleansing. Electronically Signed   By: Burman NievesWilliam  Stevens M.D.   On: 01/30/2019 19:51     2130:  Pt has tol PO well while in the ED without N/V.  No stooling while in the ED. Pt states he feels better and wants to go home now. Barium in colon preventing diagnostic CT scan; pt and family made aware. AXR without acute findings. Will obtain outpt US abd tomorrow. Labs otherwise reassuring. No clear indication for admission at this time. Dx and testing d/w pt and family.  Questions answered.  Verb understanding, agreeable to d/c home with outpt f/u.    Final Clinical Impressions(s) / ED Diagnoses   Final diagnoses:  None    ED Discharge Orders    None       Samuel JesterMcManus, Atasha Colebank, DO 02/01/19 1407

## 2019-01-30 NOTE — ED Notes (Signed)
Pt finished 2nd 8 oz cup of water and given another cup

## 2019-01-30 NOTE — ED Notes (Signed)
Pt aware of need for urine specimen. 

## 2019-01-30 NOTE — ED Triage Notes (Signed)
Pt's PCP sent pt here for middle abdominal pain. Had a barium swallow study and showed "something" per patient. Wife states hiatal hernia and acid reflux. Was sent here for more testing.

## 2019-01-30 NOTE — ED Notes (Signed)
Pt consumed one cup water 8 oz and given another.

## 2019-01-30 NOTE — ED Notes (Signed)
Okay per Dr Clarene Duke to have pt drink water instead of starting IV for IV fluid administration. Pt given cup of water, will continue to encourage PO fluids.

## 2019-01-30 NOTE — Discharge Instructions (Signed)
Take your usual prescriptions as previously directed. Eat a bland diet, avoiding greasy, fatty, fried foods, as well as spicy and acidic foods or beverages.  Avoid eating within 2 to 3 hours before going to bed or laying down.  Also avoid teas, colas, coffee, chocolate, pepermint and spearmint. Return for your outpatient abdominal ultrasound as instructed by the ED staff.  Call your regular medical doctor on Monday to schedule a follow up appointment within the next 3 days.  Return to the Emergency Department immediately sooner if worsening.

## 2019-01-30 NOTE — ED Notes (Signed)
Patient transported to CT/xray 

## 2019-01-30 NOTE — ED Notes (Signed)
Lab made aware of troponin added to blood in lab

## 2019-01-30 NOTE — ED Notes (Signed)
Patient transported to X-ray 

## 2019-02-02 ENCOUNTER — Emergency Department (HOSPITAL_COMMUNITY): Payer: Medicare Other

## 2019-02-02 ENCOUNTER — Other Ambulatory Visit: Payer: Self-pay

## 2019-02-02 ENCOUNTER — Encounter (HOSPITAL_COMMUNITY): Payer: Self-pay

## 2019-02-02 ENCOUNTER — Ambulatory Visit (HOSPITAL_COMMUNITY)
Admission: RE | Admit: 2019-02-02 | Discharge: 2019-02-02 | Disposition: A | Discharge status: Home / Self Care | Payer: Medicare Other | Source: Ambulatory Visit | Attending: Emergency Medicine | Admitting: Emergency Medicine

## 2019-02-02 ENCOUNTER — Emergency Department (HOSPITAL_COMMUNITY)
Admission: EM | Admit: 2019-02-02 | Discharge: 2019-02-02 | Disposition: A | Discharge status: Home / Self Care | Payer: Medicare Other | Attending: Emergency Medicine | Admitting: Emergency Medicine

## 2019-02-02 DIAGNOSIS — E119 Type 2 diabetes mellitus without complications: Secondary | ICD-10-CM | POA: Insufficient documentation

## 2019-02-02 DIAGNOSIS — I10 Essential (primary) hypertension: Secondary | ICD-10-CM | POA: Diagnosis not present

## 2019-02-02 DIAGNOSIS — R1010 Upper abdominal pain, unspecified: Secondary | ICD-10-CM | POA: Insufficient documentation

## 2019-02-02 DIAGNOSIS — Z87891 Personal history of nicotine dependence: Secondary | ICD-10-CM | POA: Diagnosis not present

## 2019-02-02 LAB — CBC WITH DIFFERENTIAL/PLATELET
Abs Immature Granulocytes: 0.02 10*3/uL (ref 0.00–0.07)
Basophils Absolute: 0 10*3/uL (ref 0.0–0.1)
Basophils Relative: 0 %
Eosinophils Absolute: 0.1 10*3/uL (ref 0.0–0.5)
Eosinophils Relative: 1 %
HCT: 45.2 % (ref 39.0–52.0)
Hemoglobin: 14.6 g/dL (ref 13.0–17.0)
Immature Granulocytes: 0 %
Lymphocytes Relative: 18 %
Lymphs Abs: 1.4 10*3/uL (ref 0.7–4.0)
MCH: 31.6 pg (ref 26.0–34.0)
MCHC: 32.3 g/dL (ref 30.0–36.0)
MCV: 97.8 fL (ref 80.0–100.0)
Monocytes Absolute: 0.6 10*3/uL (ref 0.1–1.0)
Monocytes Relative: 8 %
Neutro Abs: 5.3 10*3/uL (ref 1.7–7.7)
Neutrophils Relative %: 73 %
Platelets: 218 10*3/uL (ref 150–400)
RBC: 4.62 MIL/uL (ref 4.22–5.81)
RDW: 12.1 % (ref 11.5–15.5)
WBC: 7.4 10*3/uL (ref 4.0–10.5)
nRBC: 0 % (ref 0.0–0.2)

## 2019-02-02 LAB — COMPREHENSIVE METABOLIC PANEL
ALT: 16 U/L (ref 0–44)
AST: 20 U/L (ref 15–41)
Albumin: 4.4 g/dL (ref 3.5–5.0)
Alkaline Phosphatase: 134 U/L — ABNORMAL HIGH (ref 38–126)
Anion gap: 10 (ref 5–15)
BUN: 28 mg/dL — ABNORMAL HIGH (ref 8–23)
CO2: 27 mmol/L (ref 22–32)
Calcium: 9.6 mg/dL (ref 8.9–10.3)
Chloride: 101 mmol/L (ref 98–111)
Creatinine, Ser: 1.33 mg/dL — ABNORMAL HIGH (ref 0.61–1.24)
GFR calc Af Amer: 57 mL/min — ABNORMAL LOW (ref >60)
GFR calc non Af Amer: 49 mL/min — ABNORMAL LOW (ref >60)
Glucose, Bld: 140 mg/dL — ABNORMAL HIGH (ref 70–99)
Potassium: 4.2 mmol/L (ref 3.5–5.1)
Sodium: 138 mmol/L (ref 135–145)
Total Bilirubin: 1.1 mg/dL (ref 0.3–1.2)
Total Protein: 7.4 g/dL (ref 6.5–8.1)

## 2019-02-02 LAB — LIPASE, BLOOD: Lipase: 43 U/L (ref 11–51)

## 2019-02-02 MED ORDER — TRAMADOL HCL 50 MG PO TABS: 50.0000 mg | ORAL_TABLET | Freq: Four times a day (QID) | ORAL | 0 refills | Status: DC | PRN

## 2019-02-02 MED ORDER — LIDOCAINE VISCOUS HCL 2 % MT SOLN
15.0000 mL | Freq: Once | OROMUCOSAL | Status: AC
  Administered 2019-02-02: 15 mL via ORAL
  Filled 2019-02-02: qty 15

## 2019-02-02 MED ORDER — IOHEXOL 300 MG/ML  SOLN
100.0000 mL | Freq: Once | INTRAMUSCULAR | Status: AC | PRN
  Administered 2019-02-02: 100 mL via INTRAVENOUS

## 2019-02-02 MED ORDER — ALUM & MAG HYDROXIDE-SIMETH 200-200-20 MG/5ML PO SUSP
30.0000 mL | Freq: Once | ORAL | Status: AC
  Administered 2019-02-02: 30 mL via ORAL
  Filled 2019-02-02: qty 30

## 2019-02-02 MED ORDER — HYDROMORPHONE HCL 1 MG/ML IJ SOLN
0.7500 mg | Freq: Once | INTRAMUSCULAR | Status: AC
  Administered 2019-02-02: 0.75 mg via INTRAVENOUS
  Filled 2019-02-02: qty 1

## 2019-02-02 MED ORDER — IOPAMIDOL (ISOVUE-300) INJECTION 61%: 100.0000 mL | Freq: Once | INTRAVENOUS | Status: DC | PRN

## 2019-02-02 MED ORDER — SUCRALFATE 1 G PO TABS: 1.0000 g | ORAL_TABLET | Freq: Three times a day (TID) | ORAL | 0 refills | Status: DC

## 2019-02-02 MED ORDER — ONDANSETRON HCL 4 MG/2ML IJ SOLN
4.0000 mg | Freq: Once | INTRAMUSCULAR | Status: AC
  Administered 2019-02-02: 4 mg via INTRAVENOUS
  Filled 2019-02-02: qty 2

## 2019-02-02 MED ORDER — FAMOTIDINE IN NACL 20-0.9 MG/50ML-% IV SOLN
20.0000 mg | Freq: Once | INTRAVENOUS | Status: AC
  Administered 2019-02-02: 20 mg via INTRAVENOUS
  Filled 2019-02-02: qty 50

## 2019-02-02 MED ORDER — FAMOTIDINE IN NACL 20-0.9 MG/50ML-% IV SOLN
INTRAVENOUS | Status: AC
  Filled 2019-02-02: qty 50

## 2019-02-02 MED ORDER — ONDANSETRON HCL 4 MG PO TABS: 4.0000 mg | ORAL_TABLET | Freq: Four times a day (QID) | ORAL | 0 refills | Status: DC | PRN

## 2019-02-02 MED ORDER — SODIUM CHLORIDE 0.9 % IV SOLN
INTRAVENOUS | Status: DC
  Administered 2019-02-02: 13:00:00 via INTRAVENOUS

## 2019-02-02 MED ORDER — DICYCLOMINE HCL 10 MG PO CAPS
10.0000 mg | ORAL_CAPSULE | Freq: Once | ORAL | Status: AC
  Administered 2019-02-02: 10 mg via ORAL
  Filled 2019-02-02: qty 1

## 2019-02-02 NOTE — ED Provider Notes (Signed)
[facility] EMERGENCY DEPARTMENT Provider Note   CSN: [id number] Arrival date & time: 02/02/19  1059     History   Chief Complaint Chief Complaint  Patient presents with  . Abdominal Pain    HPI Martin Frazier. is a 85 y.o. male.  HPI   85 year old male with abdominal pain.  Pain is the upper abdomen in the epigastrium to right upper quadrant.  He was seen in the emergency room on 1/31 for the same.  Unable to perform CT the abdomen pelvis given barium ingestion for study done by PCP shortly before the ED visit.  Unable to obtain an ultrasound in the emergency room at that time either.  You were returned today to obtain the ultrasound which is fairly unremarkable.  I went to speak with the patient about the study results and see how he was doing.  He was actively retching when I was trying to speak with him.  He reports that his pain has gotten worse.  No fevers.  No diarrhea.  Past Medical History:  Diagnosis Date  . Diabetes mellitus without complication (HCC)    over 10 yrs  . GERD (gastroesophageal reflux disease)   . Hypertension   . Kidney stones     Patient Active Problem List   Diagnosis Date Noted  . Spinal stenosis of lumbar region with neurogenic claudication 05/21/2018  . Unspecified constipation 07/22/2014  . Diabetes (HCC) 07/22/2014  . Essential hypertension, benign 07/22/2014  . GERD (gastroesophageal reflux disease) 07/22/2014    Past Surgical History:  Procedure Laterality Date  . HEMORRHOID SURGERY    . KIDNEY STONE SURGERY    . TONSILLECTOMY          Home Medications    Prior to Admission medications   Medication Sig Start Date End Date Taking? Authorizing Provider  aspirin EC 81 MG tablet Take 81 mg by mouth daily.    [provider]  Cholecalciferol (D 400) 400 units CHEW Chew by mouth.    [provider]  Cholecalciferol (VITAMIN D) 2000 units CAPS Take 1 capsule by mouth daily.    [provider]  diclofenac  sodium (VOLTAREN) 1 % GEL apply TWO grams TO AFFECTED AREA(S) FOUR TIMES DAILY 03/25/18   [provider]  Ginkgo Biloba (GINKOBA PO) Take by mouth.    [provider]  glipiZIDE (GLUCOTROL) 5 MG tablet Take by mouth.    [provider]  Magnesium Gluconate 550 MG TABS Take by mouth.    [provider]  Magnesium Hydroxide (MAGNESIA PO) Take 1 capsule by mouth daily.    [provider]  metFORMIN (GLUCOPHAGE) 500 MG tablet Take 500 mg by mouth 2 (two) times daily with a meal.    [provider]  omeprazole (PRILOSEC) 20 MG capsule Take 20 mg by mouth daily.    [provider]  ramipril (ALTACE) 2.5 MG capsule Take 2.5 mg by mouth daily.    [provider]  Saw Palmetto, Serenoa repens, (SAW PALMETTO PO) Take 1 capsule by mouth daily.    [provider]  vitamin B-12 (CYANOCOBALAMIN) 1000 MCG tablet Take 1,000 mcg by mouth daily.     [provider]  vitamin E 400 UNIT capsule Take 400 Units by mouth daily.    [provider]    Family History No family history on file.  Social History Social History   Tobacco Use  . Smoking status: Former Smoker    Types: Cigarettes  .  Smokeless tobacco: Never Used  . Tobacco comment: quit 2006  Substance Use Topics  . Alcohol use: No  . Drug use: No     Allergies   Sulfa antibiotics   Review of Systems Review of Systems All systems reviewed and negative, other than as noted in HPI.   Physical Exam Updated Vital Signs BP 135/75 (BP Location: Right Arm)   Pulse 82   Temp 98 F (36.7 C) (Oral)   Resp 18   Ht 5' (1.524 m)   Wt 65 kg   SpO2 99%   BMI 27.99 kg/m   Physical Exam Vitals signs and nursing note reviewed.  Constitutional:      General: He is not in acute distress.    Appearance: He is well-developed.  HENT:     Head: Normocephalic and atraumatic.  Eyes:     General:        Right eye: No discharge.        Left eye: No  discharge.     Conjunctiva/sclera: Conjunctivae normal.  Neck:     Musculoskeletal: Neck supple.  Cardiovascular:     Rate and Rhythm: Normal rate and regular rhythm.     Heart sounds: Normal heart sounds. No murmur. No friction rub. No gallop.   Pulmonary:     Effort: Pulmonary effort is normal. No respiratory distress.     Breath sounds: Normal breath sounds.  Abdominal:     General: There is no distension.     Palpations: Abdomen is soft.     Tenderness: There is abdominal tenderness in the right upper quadrant and epigastric area. There is no rebound.     Hernia: No hernia is present.  Musculoskeletal:        General: No tenderness.  Skin:    General: Skin is warm and dry.  Neurological:     Mental Status: He is alert.  Psychiatric:        Behavior: Behavior normal.        Thought Content: Thought content normal.      ED Treatments / Results  Labs (all labs ordered are listed, but only abnormal results are displayed) Labs Reviewed  COMPREHENSIVE METABOLIC PANEL - Abnormal; Notable for the following components:      Result Value   Glucose, Bld 140 (*)    BUN 28 (*)    Creatinine, Ser 1.33 (*)    Alkaline Phosphatase 134 (*)    GFR calc non Af Amer 49 (*)    GFR calc Af Amer 57 (*)    All other components within normal limits  CBC WITH DIFFERENTIAL/PLATELET  LIPASE, BLOOD    EKG None  Radiology US Abdomen Complete  Result Date: 02/02/2019 CLINICAL DATA:  Acute generalized abdominal pain and nausea. EXAM: ABDOMEN ULTRASOUND COMPLETE COMPARISON:  Ultrasound of March 06, 2018. FINDINGS: Gallbladder: No gallstones or wall thickening visualized. No sonographic Murphy sign noted by sonographer. Common bile duct: Diameter: 3 mm which is within normal limits. Liver: No focal lesion identified. Within normal limits in parenchymal echogenicity. Portal vein is patent on color Doppler imaging with normal direction of blood flow towards the liver. IVC: No abnormality visualized.  Pancreas: Visualized portion unremarkable. Spleen: Size and appearance within normal limits. Right Kidney: Length: 10.5 cm. 2.2 cm cyst is noted lower pole. 8.2 cm simple cyst is noted in upper pole. 9 mm nonobstructive calculus is noted in lower pole. Echogenicity within normal limits. No mass or hydronephrosis visualized. Left Kidney: Length: 12.8 cm.  5.8 cm simple cyst is noted in upper pole. 5 cm cyst is noted in lower pole. Echogenicity within normal limits. No mass or hydronephrosis visualized. Abdominal aorta: No aneurysm visualized. Other findings: None. IMPRESSION: Bilateral simple renal cysts. No other abnormality seen in the abdomen. Electronically Signed   By: Lupita Raider, M.D.   On: 02/02/2019 10:18   Ct Abdomen Pelvis W Contrast  Result Date: 02/02/2019 CLINICAL DATA:  Acute abdominal pain, RIGHT upper quadrant pain and pain in RIGHT side of back for 1 week, nausea, and remote history of kidney stones, history hypertension, GERD, diabetes mellitus, former smoker EXAM: CT ABDOMEN AND PELVIS WITH CONTRAST TECHNIQUE: Multidetector CT imaging of the abdomen and pelvis was performed using the standard protocol following bolus administration of intravenous contrast. Sagittal and coronal MPR images reconstructed from axial data set. CONTRAST:  No oral contrast; OMNIPAQUE IOHEXOL 300 MG/ML SOLN IV COMPARISON:  None FINDINGS: Lower chest: Dependent bibasilar atelectasis Hepatobiliary: Gallbladder and liver normal appearance Pancreas: Atrophic Spleen: Normal appearance Adrenals/Urinary Tract: Adrenal glands normal appearance. BILATERAL renal cysts largest posterior aspect upper pole RIGHT kidney 6.4 x 7.9 cm. Nonobstructing 14 mm diameter calculus at inferior pole RIGHT kidney. Tiny nonobstructing LEFT renal calculus. No hydronephrosis or hydroureter. Ureters and bladder unremarkable. Stomach/Bowel: Scattered dense retained contrast in colon. Question mild bowel loops unremarkable. Wall thickening  of distal gastric antrum/pylorus versus artifact from underdistention. Appendix appears to be surgically absent. Vascular/Lymphatic: Atherosclerotic calcifications aorta and iliac arteries without aneurysm. Minimal coronary arterial calcification. No adenopathy. Reproductive: Prostatic enlargement with gland measuring at 6.3 x 4.3 x 4.1 cm. Other: No free air free fluid. No definite hernia or acute inflammatory process. Musculoskeletal: Diffuse osseous demineralization. Multilevel degenerative disc disease changes of the thoracolumbar spine. Grade 1 anterolisthesis L4-L5. IMPRESSION: Bibasilar atelectasis. BILATERAL renal cysts and nonobstructing renal calculi both larger on RIGHT. Prostatic enlargement. Questionable mild wall thickening of distal gastric antrum and pylorus versus artifact from underdistention. No other definite acute intra-abdominal or intrapelvic abnormalities. Electronically Signed   By: Ulyses Southward M.D.   On: 02/02/2019 14:42    Procedures Procedures (including critical care time)  Medications Ordered in ED Medications  0.9 %  sodium chloride infusion ( Intravenous New Bag/Given 02/02/19 1237)  HYDROmorphone (DILAUDID) injection 0.75 mg (has no administration in time range)  ondansetron (ZOFRAN) injection 4 mg (4 mg Intravenous Given 02/02/19 1237)     Initial Impression / Assessment and Plan / ED Course  I have reviewed the triage vital signs and the nursing notes.  Pertinent labs & imaging results that were available during my care of the patient were reviewed by me and considered in my medical decision making (see chart for details).     85 year old male with upper abdominal pain.  His second ED evaluation for the same.  Unremarkable ultrasound.  CT with gastric inflammation versus under distention.  I suspect he may have some gastritis or possibly peptic ulcer.  He uses aspirin, Voltaren gel, Aspercreme and Aleve for chronic neck and upper back pain.  Advised to stop these.   Prescribe tramadol as an alternative for pain.  He is on Prilosec.  Advised to increase it to twice daily.  Carafate for a few days.  Emergent return precautions were discussed.  Outpatient follow-up otherwise.  Final Clinical Impressions(s) / ED Diagnoses   Final diagnoses:  Pain of upper abdomen    ED Discharge Orders    None       Raeford Razor, MD  02/02/19 1612  

## 2019-02-02 NOTE — Discharge Instructions (Addendum)
Stop Voltaren gel, Aspercreme, aleve and aspirin. Increase prilosec to twice per day. Take other medications as prescribed.

## 2019-02-02 NOTE — ED Triage Notes (Signed)
Pt reports pain in RUQ and pain in r side of back x 1 week.  Reports was seen in ed on 1/31 and sent for [place] today. Pt had [place] and Dr. Juleen China requesting pt be evaluated in ed again and have CT scan.   PT nauseated prior to triage.  Denies pain at this time.

## 2019-02-17 ENCOUNTER — Encounter (INDEPENDENT_AMBULATORY_CARE_PROVIDER_SITE_OTHER): Payer: Self-pay | Admitting: Internal Medicine

## 2019-02-17 ENCOUNTER — Ambulatory Visit (INDEPENDENT_AMBULATORY_CARE_PROVIDER_SITE_OTHER): Payer: Medicare Other | Admitting: Internal Medicine

## 2019-02-17 VITALS — BP 118/71 | HR 89 | Temp 98.0°F | Ht 65.0 in | Wt 141.4 lb

## 2019-02-17 DIAGNOSIS — R1013 Epigastric pain: Secondary | ICD-10-CM | POA: Diagnosis not present

## 2019-02-17 NOTE — Progress Notes (Signed)
Subjective:    Patient ID: Martin Frazier., male    DOB: 1934/07/22, 85 y.o.   MRN: [id number]  HPI Referred by Dr. Otilio Miu for gastritis/EGD. States he has had epigastric pain for 1 month.  He has constant burping. States he has epigastric pain radiating into back. Today he states it is better.  He states he occasionally has some dysphagia. He states his epigastric pain 15-20% better. Worse when he moves. The pain started around the end of January around 9pm. Not related to eating. See in ED 02/02/2019 (AP) for abdominal pain. US abdomen revealed bilateral simple renal cysts. No other abnormalities in the abdomen. CT scan revealed IMPRESSION: Bibasilar atelectasis. BILATERAL renal cysts and nonobstructing renal calculi both larger on RIGHT.Prostatic enlargement. Questionable mild wall thickening of distal gastric antrum and pylorus versus artifact from underdistention. No other definite acute intra-abdominal or intrapelvic abnormalities. Appetite is okay. No weight. BMs are normal.  He can eat anything he wants. Wife is voiding spicy and fried foods.  He denies any trauma.   CBC    Component Value Date/Time   WBC 7.4 02/02/2019 1125   RBC 4.62 02/02/2019 1125   HGB 14.6 02/02/2019 1125   HCT 45.2 02/02/2019 1125   PLT 218 02/02/2019 1125   MCV 97.8 02/02/2019 1125   MCH 31.6 02/02/2019 1125   MCHC 32.3 02/02/2019 1125   RDW 12.1 02/02/2019 1125   LYMPHSABS 1.4 02/02/2019 1125   MONOABS 0.6 02/02/2019 1125   EOSABS 0.1 02/02/2019 1125   BASOSABS 0.0 02/02/2019 1125   CMP Latest Ref Rng & Units 02/02/2019 01/30/2019 03/06/2018  Glucose 70 - 99 mg/dL 485(I) 627(O) 83  BUN 8 - 23 mg/dL 35(K) 09(F) 81(W)  Creatinine 0.61 - 1.24 mg/dL 2.99(B) 7.16(R) 6.78(L)  Sodium 135 - 145 mmol/L 138 137 138  Potassium 3.5 - 5.1 mmol/L 4.2 4.7 4.0  Chloride 98 - 111 mmol/L 101 103 101  CO2 22 - 32 mmol/L 27 27 23   Calcium 8.9 - 10.3 mg/dL 9.6 9.2 9.3  Total Protein 6.5 - 8.1 g/dL 7.4 6.8 7.1    Total Bilirubin 0.3 - 1.2 mg/dL 1.1 0.5 0.7  Alkaline Phos 38 - 126 U/L 134(H) 132(H) 129(H)  AST 15 - 41 U/L 20 20 27   ALT 0 - 44 U/L 16 17 18     Colonoscopy 2006 (Incomplete). diarrhea felt to be IBS,. Tortuous, very non compliant sigmoid colon preventing complete exam. Sigmoid colon and rectal mucosa wa normal. Small external hemorrhoids with anal papilla.   ASA 81mg  stopped 02/02/2019  Hx of diabetes Review of Systems Past Medical History:  Diagnosis Date  . Diabetes mellitus without complication (HCC)    over 10 yrs  . GERD (gastroesophageal reflux disease)   . Hypertension   . Kidney stones     Past Surgical History:  Procedure Laterality Date  . HEMORRHOID SURGERY    . KIDNEY STONE SURGERY    . TONSILLECTOMY      Allergies  Allergen Reactions  . Sulfa Antibiotics Nausea And Vomiting    Current Outpatient Medications on File Prior to Visit  Medication Sig Dispense Refill  . Cholecalciferol (D 400) 400 units CHEW Chew by mouth.    . Cholecalciferol (VITAMIN D) 2000 units CAPS Take 1 capsule by mouth daily.    . Ginkgo Biloba (GINKOBA PO) Take by mouth.    Marland Kitchen glipiZIDE (GLUCOTROL) 5 MG tablet Take by mouth.    . Magnesium Gluconate 550 MG TABS Take by  mouth.    . Magnesium Hydroxide (MAGNESIA PO) Take 1 capsule by mouth daily.    . metFORMIN (GLUCOPHAGE) 500 MG tablet Take 500 mg by mouth 2 (two) times daily with a meal.    . omeprazole (PRILOSEC) 20 MG capsule Take 20 mg by mouth 2 (two) times daily before a meal.     . ondansetron (ZOFRAN) 4 MG tablet Take 1 tablet (4 mg total) by mouth every 6 (six) hours as needed for nausea or vomiting. 12 tablet 0  . ramipril (ALTACE) 2.5 MG capsule Take 2.5 mg by mouth daily.    . Saw Palmetto, Serenoa repens, (SAW PALMETTO PO) Take 1 capsule by mouth daily.    . sucralfate (CARAFATE) 1 g tablet Take 1 tablet (1 g total) by mouth 4 (four) times daily -  with meals and at bedtime. 20 tablet 0  . vitamin B-12 (CYANOCOBALAMIN)  1000 MCG tablet Take 1,000 mcg by mouth daily.     . vitamin E 400 UNIT capsule Take 400 Units by mouth daily.     No current facility-administered medications on file prior to visit.         Objective:   Physical Exam Blood pressure 118/71, pulse 89, temperature 98 F (36.7 C), height 5\' 5"  (1.651 m), weight 141 lb 6.4 oz (64.1 kg). Alert and oriented. Skin warm and dry. Oral mucosa is moist.   . Sclera anicteric, conjunctivae is pink. Thyroid not enlarged. No cervical lymphadenopathy. Lungs clear. Heart regular rate and rhythm.  Abdomen is soft. Bowel sounds are positive. No hepatomegaly. No abdominal masses felt. No tenderness.  No edema to lower extremities.         Assessment & Plan:  Epigastric pain with normal [place]. CT scan showed ?  Questionable mild wall thickening of distal gastric antrum and pylorus versus artifact from underdistention. Am going to give him samples of Dexilant x 30 days. Will get last EGD from [facility].

## 2019-02-17 NOTE — Patient Instructions (Addendum)
Samples of Dexilant given to patient. OV in 4 weeks.  Stop the Omeprazole Start the Dexilant.  Gastroesophageal Reflux Disease, Adult Gastroesophageal reflux (GER) happens when acid from the stomach flows up into the tube that connects the mouth and the stomach (esophagus). Normally, food travels down the esophagus and stays in the stomach to be digested. With GER, food and stomach acid sometimes move back up into the esophagus. You may have a disease called gastroesophageal reflux disease (GERD) if the reflux:  Happens often.  Causes frequent or very bad symptoms.  Causes problems such as damage to the esophagus. When this happens, the esophagus becomes sore and swollen (inflamed). Over time, GERD can make small holes (ulcers) in the lining of the esophagus. What are the causes? This condition is caused by a problem with the muscle between the esophagus and the stomach. When this muscle is weak or not normal, it does not close properly to keep food and acid from coming back up from the stomach. The muscle can be weak because of:  Tobacco use.  Pregnancy.  Having a certain type of hernia (hiatal hernia).  Alcohol use.  Certain foods and drinks, such as coffee, chocolate, onions, and peppermint. What increases the risk? You are more likely to develop this condition if you:  Are overweight.  Have a disease that affects your connective tissue.  Use NSAID medicines. What are the signs or symptoms? Symptoms of this condition include:  Heartburn.  Difficult or painful swallowing.  The feeling of having a lump in the throat.  A bitter taste in the mouth.  Bad breath.  Having a lot of saliva.  Having an upset or bloated stomach.  Belching.  Chest pain. Different conditions can cause chest pain. Make sure you see your doctor if you have chest pain.  Shortness of breath or noisy breathing (wheezing).  Ongoing (chronic) cough or a cough at night.  Wearing away of the  surface of teeth (tooth enamel).  Weight loss. How is this treated? Treatment will depend on how bad your symptoms are. Your doctor may suggest:  Changes to your diet.  Medicine.  Surgery. Follow these instructions at home: Eating and drinking   Follow a diet as told by your doctor. You may need to avoid foods and drinks such as: ? Coffee and tea (with or without caffeine). ? Drinks that contain alcohol. ? Energy drinks and sports drinks. ? Bubbly (carbonated) drinks or sodas. ? Chocolate and cocoa. ? Peppermint and mint flavorings. ? Garlic and onions. ? Horseradish. ? Spicy and acidic foods. These include peppers, chili powder, curry powder, vinegar, hot sauces, and BBQ sauce. ? Citrus fruit juices and citrus fruits, such as oranges, lemons, and limes. ? Tomato-based foods. These include red sauce, chili, salsa, and pizza with red sauce. ? Fried and fatty foods. These include donuts, french fries, potato chips, and high-fat dressings. ? High-fat meats. These include hot dogs, rib eye steak, sausage, ham, and bacon. ? High-fat dairy items, such as whole milk, butter, and cream cheese.  Eat small meals often. Avoid eating large meals.  Avoid drinking large amounts of liquid with your meals.  Avoid eating meals during the 2-3 hours before bedtime.  Avoid lying down right after you eat.  Do not exercise right after you eat. Lifestyle   Do not use any products that contain nicotine or tobacco. These include cigarettes, e-cigarettes, and chewing tobacco. If you need help quitting, ask your doctor.  Try to lower your  stress. If you need help doing this, ask your doctor.  If you are overweight, lose an amount of weight that is healthy for you. Ask your doctor about a safe weight loss goal. General instructions  Pay attention to any changes in your symptoms.  Take over-the-counter and prescription medicines only as told by your doctor. Do not take aspirin, ibuprofen, or  other NSAIDs unless your doctor says it is okay.  Wear loose clothes. Do not wear anything tight around your waist.  Raise (elevate) the head of your bed about 6 inches (15 cm).  Avoid bending over if this makes your symptoms worse.  Keep all follow-up visits as told by your doctor. This is important. Contact a doctor if:  You have new symptoms.  You lose weight and you do not know why.  You have trouble swallowing or it hurts to swallow.  You have wheezing or a cough that keeps happening.  Your symptoms do not get better with treatment.  You have a hoarse voice. Get help right away if:  You have pain in your arms, neck, jaw, teeth, or back.  You feel sweaty, dizzy, or light-headed.  You have chest pain or shortness of breath.  You throw up (vomit) and your throw-up looks like blood or coffee grounds.  You pass out (faint).  Your poop (stool) is bloody or black.  You cannot swallow, drink, or eat. Summary  If a person has gastroesophageal reflux disease (GERD), food and stomach acid move back up into the esophagus and cause symptoms or problems such as damage to the esophagus.  Treatment will depend on how bad your symptoms are.  Follow a diet as told by your doctor.  Take all medicines only as told by your doctor. This information is not intended to replace advice given to you by your health care provider. Make sure you discuss any questions you have with your health care provider. Document Released: 06/04/2008 Document Revised: 06/25/2018 Document Reviewed: 06/25/2018 Elsevier Interactive Patient Education  2019 [employer].

## 2019-02-23 ENCOUNTER — Ambulatory Visit (INDEPENDENT_AMBULATORY_CARE_PROVIDER_SITE_OTHER): Payer: Medicare Other | Admitting: Internal Medicine

## 2019-03-17 ENCOUNTER — Ambulatory Visit (INDEPENDENT_AMBULATORY_CARE_PROVIDER_SITE_OTHER): Payer: Medicare Other | Admitting: Internal Medicine

## 2019-03-17 ENCOUNTER — Telehealth (INDEPENDENT_AMBULATORY_CARE_PROVIDER_SITE_OTHER): Payer: Self-pay | Admitting: Internal Medicine

## 2019-03-17 NOTE — Telephone Encounter (Signed)
err

## 2019-03-25 ENCOUNTER — Other Ambulatory Visit: Payer: Self-pay

## 2019-03-25 ENCOUNTER — Ambulatory Visit (INDEPENDENT_AMBULATORY_CARE_PROVIDER_SITE_OTHER): Payer: Medicare Other | Admitting: Urology

## 2019-03-25 DIAGNOSIS — R338 Other retention of urine: Secondary | ICD-10-CM | POA: Diagnosis not present

## 2019-03-26 ENCOUNTER — Telehealth (INDEPENDENT_AMBULATORY_CARE_PROVIDER_SITE_OTHER): Payer: Self-pay | Admitting: Internal Medicine

## 2019-03-26 ENCOUNTER — Ambulatory Visit (INDEPENDENT_AMBULATORY_CARE_PROVIDER_SITE_OTHER): Payer: Medicare Other | Admitting: Internal Medicine

## 2019-03-26 ENCOUNTER — Encounter (INDEPENDENT_AMBULATORY_CARE_PROVIDER_SITE_OTHER): Payer: Self-pay | Admitting: Internal Medicine

## 2019-03-26 VITALS — BP 121/58 | HR 72 | Temp 98.0°F | Ht 66.0 in | Wt 142.0 lb

## 2019-03-26 DIAGNOSIS — R935 Abnormal findings on diagnostic imaging of other abdominal regions, including retroperitoneum: Secondary | ICD-10-CM | POA: Insufficient documentation

## 2019-03-26 DIAGNOSIS — R1013 Epigastric pain: Secondary | ICD-10-CM | POA: Insufficient documentation

## 2019-03-26 NOTE — Telephone Encounter (Signed)
Wife called stated patient is taking the antibiotic Cefdinir 300 mg

## 2019-03-26 NOTE — Patient Instructions (Signed)
The risks of bleeding, perforation and infection were reviewed with patient.  

## 2019-03-26 NOTE — Progress Notes (Addendum)
Subjective:    Patient ID: Martin Frazier., male    DOB: [birth year], 85 y.o.   MRN: [id number] Patient did not bring in his medications.  HPI  Here today for f/u. Last seen February 2020. Referred by Dr. Lorra Hals for gastritis/EGD. Had been having constant burping. Epigastric pain would radiate into his back. At [place] he was better. The pain was worse when he moved. Pain started around the end of January and was not related to eating.  See in ED 02/02/2019 (AP) for abdominal pain. US abdomen revealed bilateral simple renal cysts. No other abnormalities in the abdomen. CT scan revealed IMPRESSION: Bibasilar atelectasis. BILATERAL renal cysts and nonobstructing renal calculi both larger on RIGHT.Prostatic enlargement. Questionable mild wall thickening of distal gastric antrum and pylorus versus artifact from underdistention. No other definite acute intra-abdominal or intrapelvic Abnormalities. He was given samples of Dexilant x 30 days x 2.. He states he cannot tell any difference with the Dexilant. . Recently at [facility] for a UTI/sepsis (03/12/2019). He was admitted x 2 days. He has a foley in place. He saw Dr. Alyson Ingles yesterday for f/u. Has Rx for [facility] which he has been taking.  He says his burping is better. His appetite is better. He is staying hungry. Does have some tenderness in his epigastric tenderness.  No nausea or vomiting. No rt upper quadrant pain.  States he has had acid reflux.      CBC    Component Value Date/Time   WBC 7.4 02/02/2019 1125   RBC 4.62 02/02/2019 1125   HGB 14.6 02/02/2019 1125   HCT 45.2 02/02/2019 1125   PLT 218 02/02/2019 1125   MCV 97.8 02/02/2019 1125   MCH 31.6 02/02/2019 1125   MCHC 32.3 02/02/2019 1125   RDW 12.1 02/02/2019 1125   LYMPHSABS 1.4 02/02/2019 1125   MONOABS 0.6 02/02/2019 1125   EOSABS 0.1 02/02/2019 1125   BASOSABS 0.0 02/02/2019 1125   Hepatic Function Latest Ref Rng & Units 02/02/2019 01/30/2019 03/06/2018  Total Protein 6.5 -  8.1 g/dL 7.4 6.8 7.1  Albumin 3.5 - 5.0 g/dL 4.4 4.2 4.1  AST 15 - 41 U/L 20 20 27   ALT 0 - 44 U/L 16 17 18   Alk Phosphatase 38 - 126 U/L 134(H) 132(H) 129(H)  Total Bilirubin 0.3 - 1.2 mg/dL 1.1 0.5 0.7  02/02/2019 Lipase 43.   Review of Systems Past Medical History:  Diagnosis Date  . Diabetes mellitus without complication ([place])    over 10 yrs  . GERD (gastroesophageal reflux disease)   . Hypertension   . Kidney stones     Past Surgical History:  Procedure Laterality Date  . HEMORRHOID SURGERY    . KIDNEY STONE SURGERY    . TONSILLECTOMY      Allergies  Allergen Reactions  . Sulfa Antibiotics Nausea And Vomiting    Current Outpatient Medications on File Prior to Visit  Medication Sig Dispense Refill  . cefdinir (OMNICEF) 300 MG capsule Take 300 mg by mouth 2 (two) times daily.    . Cholecalciferol (VITAMIN D) 2000 units CAPS Take 1 capsule by mouth daily.    . Ginkgo Biloba (GINKOBA PO) Take 1 tablet by mouth daily.     . metFORMIN (GLUCOPHAGE) 500 MG tablet Take 500 mg by mouth daily with breakfast.     . ondansetron (ZOFRAN) 4 MG tablet Take 1 tablet (4 mg total) by mouth every 6 (six) hours as needed for nausea or vomiting. (Patient not taking:  Reported on 03/26/2019) 12 tablet 0  . ramipril (ALTACE) 2.5 MG capsule Take 2.5 mg by mouth daily.    . Saw Palmetto, Serenoa repens, (SAW PALMETTO PO) Take 1 capsule by mouth daily.    . sucralfate (CARAFATE) 1 g tablet Take 1 tablet (1 g total) by mouth 4 (four) times daily -  with meals and at bedtime. (Patient not taking: Reported on 03/26/2019) 20 tablet 0  . tamsulosin (FLOMAX) 0.4 MG CAPS capsule Take 0.4 mg by mouth daily.     . vitamin B-12 (CYANOCOBALAMIN) 1000 MCG tablet Take 1,000 mcg by mouth daily.     . vitamin E 400 UNIT capsule Take 400 Units by mouth daily.     No current facility-administered medications on file prior to visit.         Objective:   Physical Exam Blood pressure (!) 121/58, pulse 72,  temperature 98 F (36.7 C), height 5' 6"  (1.676 m), weight 142 lb (64.4 kg). Alert and oriented. Skin warm and dry. Oral mucosa is moist.   . Sclera anicteric, conjunctivae is pink. Thyroid not enlarged. No cervical lymphadenopathy. Lungs clear. Heart regular rate and rhythm.  Abdomen is soft. Bowel sounds are positive. No hepatomegaly. No abdominal masses felt. Slight epigastric tenderness.  No edema to lower extremities.  Patient is very hard of hearing and has hearing aides in.          Assessment & Plan:  Epigastric pain, Abnormal CT of the abdomen. Continues to have some symptoms. He will continue the [place]. EGD. Will get a CBC and Hepatic.

## 2019-03-27 LAB — CBC WITH DIFFERENTIAL/PLATELET
Absolute Monocytes: 704 cells/uL (ref 200–950)
Basophils Absolute: 62 cells/uL (ref 0–200)
Basophils Relative: 0.9 %
Eosinophils Absolute: 69 cells/uL (ref 15–500)
Eosinophils Relative: 1 %
HEMATOCRIT: 36.5 % — AB (ref 38.5–50.0)
Hemoglobin: 12.1 g/dL — ABNORMAL LOW (ref 13.2–17.1)
LYMPHS ABS: 1415 {cells}/uL (ref 850–3900)
MCH: 31.8 pg (ref 27.0–33.0)
MCHC: 33.2 g/dL (ref 32.0–36.0)
MCV: 95.8 fL (ref 80.0–100.0)
MPV: 10.8 fL (ref 7.5–12.5)
Monocytes Relative: 10.2 %
NEUTROS PCT: 67.4 %
Neutro Abs: 4651 cells/uL (ref 1500–7800)
Platelets: 258 10*3/uL (ref 140–400)
RBC: 3.81 10*6/uL — ABNORMAL LOW (ref 4.20–5.80)
RDW: 11.8 % (ref 11.0–15.0)
Total Lymphocyte: 20.5 %
WBC: 6.9 10*3/uL (ref 3.8–10.8)

## 2019-03-27 LAB — HEPATIC FUNCTION PANEL
AG Ratio: 1.6 (calc) (ref 1.0–2.5)
ALT: 12 U/L (ref 9–46)
AST: 17 U/L (ref 10–35)
Albumin: 3.8 g/dL (ref 3.6–5.1)
Alkaline phosphatase (APISO): 140 U/L (ref 35–144)
Bilirubin, Direct: 0.1 mg/dL (ref 0.0–0.2)
Globulin: 2.4 g/dL (calc) (ref 1.9–3.7)
Indirect Bilirubin: 0.3 mg/dL (calc) (ref 0.2–1.2)
Total Bilirubin: 0.4 mg/dL (ref 0.2–1.2)
Total Protein: 6.2 g/dL (ref 6.1–8.1)

## 2019-03-31 ENCOUNTER — Other Ambulatory Visit: Payer: Self-pay

## 2019-04-01 ENCOUNTER — Ambulatory Visit (HOSPITAL_COMMUNITY)
Admission: RE | Admit: 2019-04-01 | Discharge: 2019-04-01 | Disposition: A | Discharge status: Home / Self Care | Payer: Medicare Other | Attending: Internal Medicine | Admitting: Internal Medicine

## 2019-04-01 ENCOUNTER — Encounter (HOSPITAL_COMMUNITY): Payer: Self-pay | Admitting: *Deleted

## 2019-04-01 ENCOUNTER — Other Ambulatory Visit: Payer: Self-pay

## 2019-04-01 ENCOUNTER — Encounter (HOSPITAL_COMMUNITY)
Admission: RE | Disposition: A | Discharge status: Home / Self Care | Payer: Self-pay | Source: Home / Self Care | Attending: Internal Medicine

## 2019-04-01 DIAGNOSIS — Z87891 Personal history of nicotine dependence: Secondary | ICD-10-CM | POA: Diagnosis not present

## 2019-04-01 DIAGNOSIS — Z87442 Personal history of urinary calculi: Secondary | ICD-10-CM | POA: Diagnosis not present

## 2019-04-01 DIAGNOSIS — R1013 Epigastric pain: Secondary | ICD-10-CM | POA: Insufficient documentation

## 2019-04-01 DIAGNOSIS — R1031 Right lower quadrant pain: Secondary | ICD-10-CM | POA: Diagnosis not present

## 2019-04-01 DIAGNOSIS — G8929 Other chronic pain: Secondary | ICD-10-CM | POA: Diagnosis not present

## 2019-04-01 DIAGNOSIS — K219 Gastro-esophageal reflux disease without esophagitis: Secondary | ICD-10-CM | POA: Insufficient documentation

## 2019-04-01 DIAGNOSIS — Z7984 Long term (current) use of oral hypoglycemic drugs: Secondary | ICD-10-CM | POA: Diagnosis not present

## 2019-04-01 DIAGNOSIS — R935 Abnormal findings on diagnostic imaging of other abdominal regions, including retroperitoneum: Secondary | ICD-10-CM | POA: Insufficient documentation

## 2019-04-01 DIAGNOSIS — R933 Abnormal findings on diagnostic imaging of other parts of digestive tract: Secondary | ICD-10-CM

## 2019-04-01 DIAGNOSIS — Z79899 Other long term (current) drug therapy: Secondary | ICD-10-CM | POA: Diagnosis not present

## 2019-04-01 DIAGNOSIS — Z882 Allergy status to sulfonamides status: Secondary | ICD-10-CM | POA: Insufficient documentation

## 2019-04-01 DIAGNOSIS — E119 Type 2 diabetes mellitus without complications: Secondary | ICD-10-CM | POA: Diagnosis not present

## 2019-04-01 DIAGNOSIS — I10 Essential (primary) hypertension: Secondary | ICD-10-CM | POA: Diagnosis not present

## 2019-04-01 DIAGNOSIS — D649 Anemia, unspecified: Secondary | ICD-10-CM | POA: Insufficient documentation

## 2019-04-01 DIAGNOSIS — K3189 Other diseases of stomach and duodenum: Secondary | ICD-10-CM

## 2019-04-01 HISTORY — PX: ESOPHAGOGASTRODUODENOSCOPY: SHX5428

## 2019-04-01 HISTORY — PX: BIOPSY: SHX5522

## 2019-04-01 LAB — GLUCOSE, CAPILLARY: Glucose-Capillary: 134 mg/dL — ABNORMAL HIGH (ref 70–99)

## 2019-04-01 SURGERY — EGD (ESOPHAGOGASTRODUODENOSCOPY)
Anesthesia: Moderate Sedation

## 2019-04-01 MED ORDER — LIDOCAINE VISCOUS HCL 2 % MT SOLN
OROMUCOSAL | Status: DC | PRN
  Administered 2019-04-01: 4 mL via OROMUCOSAL

## 2019-04-01 MED ORDER — MEPERIDINE HCL 50 MG/ML IJ SOLN
INTRAMUSCULAR | Status: AC
  Filled 2019-04-01: qty 1

## 2019-04-01 MED ORDER — SODIUM CHLORIDE 0.9 % IV SOLN
INTRAVENOUS | Status: DC
  Administered 2019-04-01: 10:00:00 via INTRAVENOUS

## 2019-04-01 MED ORDER — STERILE WATER FOR IRRIGATION IR SOLN
Status: DC | PRN
  Administered 2019-04-01: 11:00:00

## 2019-04-01 MED ORDER — LIDOCAINE VISCOUS HCL 2 % MT SOLN
OROMUCOSAL | Status: AC
  Filled 2019-04-01: qty 15

## 2019-04-01 MED ORDER — MEPERIDINE HCL 50 MG/ML IJ SOLN
INTRAMUSCULAR | Status: DC | PRN
  Administered 2019-04-01: 20 mg via INTRAVENOUS

## 2019-04-01 MED ORDER — MIDAZOLAM HCL 5 MG/5ML IJ SOLN
INTRAMUSCULAR | Status: DC | PRN
  Administered 2019-04-01 (×3): 1 mg via INTRAVENOUS

## 2019-04-01 MED ORDER — MIDAZOLAM HCL 5 MG/5ML IJ SOLN
INTRAMUSCULAR | Status: AC
  Filled 2019-04-01: qty 10

## 2019-04-01 NOTE — H&P (Signed)
Martin Frazier. is an 85 y.o. male.   Chief Complaint: Patient is here for EGD. HPI: Patient is a 87-year-old Caucasian male who has chronic GERD who is been having intermittent epigastric pain for the last 20+ years.  About 2 months ago pain became intense.  He was seen in emergency room.  CBC revealed mild anemia but LFTs were normal.  Ultrasound was negative for cholelithiasis or gallbladder wall thickening.  CT scan revealed antral/prepyloric wall thickening versus poor distention.  Patient's aspirin was discontinued and he was advised not to take Aleve.  He has been taking Aleve on as needed but not daily basis for back problems.  He has had nausea and heartburn with this pain but only one episode of vomiting.  No history of hematemesis melena or rectal bleeding.  He states since his symptoms gotten worse he has lost 10 pounds.  However he has not had any pain this week.  Past Medical History:  Diagnosis Date  . Diabetes mellitus without complication ([facility])    over 10 yrs  . GERD (gastroesophageal reflux disease)   . Hypertension   . Kidney stones     Past Surgical History:  Procedure Laterality Date  . HEMORRHOID SURGERY    . KIDNEY STONE SURGERY    . TONSILLECTOMY      History reviewed. No pertinent family history. Social History:  reports that he has quit smoking. His smoking use included cigarettes. He has never used smokeless tobacco. He reports that he does not drink alcohol or use drugs.  Allergies:  Allergies  Allergen Reactions  . Sulfa Antibiotics Nausea And Vomiting    Medications Prior to Admission  Medication Sig Dispense Refill  . cefdinir (OMNICEF) 300 MG capsule Take 300 mg by mouth 2 (two) times daily.    . Cholecalciferol (VITAMIN D) 2000 units CAPS Take 1 capsule by mouth daily.    Marland Kitchen dexlansoprazole (DEXILANT) 60 MG capsule Take 60 mg by mouth daily.    . Ginkgo Biloba (GINKOBA PO) Take 1 tablet by mouth daily.     . Lactobacillus (ACIDOPHILUS PO) Take 1  capsule by mouth daily.    . Magnesium 250 MG TABS Take 1 tablet by mouth daily.    . metFORMIN (GLUCOPHAGE) 500 MG tablet Take 500 mg by mouth daily with breakfast.     . ramipril (ALTACE) 2.5 MG capsule Take 2.5 mg by mouth daily.    . Saw Palmetto, Serenoa repens, (SAW PALMETTO PO) Take 1 capsule by mouth daily.    . tamsulosin (FLOMAX) 0.4 MG CAPS capsule Take 0.4 mg by mouth daily.     . vitamin B-12 (CYANOCOBALAMIN) 1000 MCG tablet Take 1,000 mcg by mouth daily.     . vitamin E 400 UNIT capsule Take 400 Units by mouth daily.    . ondansetron (ZOFRAN) 4 MG tablet Take 1 tablet (4 mg total) by mouth every 6 (six) hours as needed for nausea or vomiting. (Patient not taking: Reported on 03/26/2019) 12 tablet 0  . sucralfate (CARAFATE) 1 g tablet Take 1 tablet (1 g total) by mouth 4 (four) times daily -  with meals and at bedtime. (Patient not taking: Reported on 03/26/2019) 20 tablet 0    Results for orders placed or performed during the hospital encounter of 04/01/19 (from the past 48 hour(s))  Glucose, capillary     Status: Abnormal   Collection Time: 04/01/19 10:05 AM  Result Value Ref Range   Glucose-Capillary 134 (H) 70 -  99 mg/dL   No results found.  ROS  Blood pressure 130/64, pulse 76, temperature 97.9 F (36.6 C), temperature source Oral, resp. rate 16, height 5\' 6"  (1.676 m), weight 64.4 kg, SpO2 98 %. Physical Exam  Constitutional: He appears well-developed and well-nourished.  Patient has hearing impairment.  Eyes: Conjunctivae are normal. No scleral icterus.  Neck: No thyromegaly present.  Cardiovascular: Normal rate, regular rhythm and normal heart sounds.  No murmur heard. Respiratory: Effort normal and breath sounds normal.  GI: Soft. He exhibits no distension and no mass. There is no abdominal tenderness.  Musculoskeletal:        General: No edema.  Lymphadenopathy:    He has no cervical adenopathy.  Neurological: He is alert.  Skin: Skin is warm and dry.      Assessment/Plan Acute on chronic epigastric pain. Gastric wall thickening on CT. Diagnostic EGD.  Lionel December, MD 04/01/2019, 11:03 AM

## 2019-04-01 NOTE — Discharge Instructions (Signed)
No OTC NSAIDs. Resume other medications as before. Resume usual diet. No driving for 24 hours. Physician will call with biopsy results. Call office if epigastric pain recurs.  PATIENT INSTRUCTIONS POST-ANESTHESIA  IMMEDIATELY FOLLOWING SURGERY:  Do not drive or operate machinery for the first twenty four hours after surgery.  Do not make any important decisions for twenty four hours after surgery or while taking narcotic pain medications or sedatives.  If you develop intractable nausea and vomiting or a severe headache please notify your doctor immediately.  FOLLOW-UP:  Please make an appointment with your surgeon as instructed. You do not need to follow up with anesthesia unless specifically instructed to do so.  WOUND CARE INSTRUCTIONS (if applicable):  Keep a dry clean dressing on the anesthesia/puncture wound site if there is drainage.  Once the wound has quit draining you may leave it open to air.  Generally you should leave the bandage intact for twenty four hours unless there is drainage.  If the epidural site drains for more than 36-48 hours please call the anesthesia department.  QUESTIONS?:  Please feel free to call your physician or the hospital operator if you have any questions, and they will be happy to assist you.      Upper Endoscopy, Adult, Care After This sheet gives you information about how to care for yourself after your procedure. Your health care provider may also give you more specific instructions. If you have problems or questions, contact your health care provider. What can I expect after the procedure? After the procedure, it is common to have:  A sore throat.  Mild stomach pain or discomfort.  Bloating.  Nausea. Follow these instructions at home:   Follow instructions from your health care provider about what to eat or drink after your procedure.  Return to your normal activities as told by your health care provider. Ask your health care provider what  activities are safe for you.  Take over-the-counter and prescription medicines only as told by your health care provider.  Do not drive for 24 hours if you were given a sedative during your procedure.  Keep all follow-up visits as told by your health care provider. This is important. Contact a health care provider if you have:  A sore throat that lasts longer than one day.  Trouble swallowing. Get help right away if:  You vomit blood or your vomit looks like coffee grounds.  You have: ? A fever. ? Bloody, black, or tarry stools. ? A severe sore throat or you cannot swallow. ? Difficulty breathing. ? Severe pain in your chest or abdomen. Summary  After the procedure, it is common to have a sore throat, mild stomach discomfort, bloating, and nausea.  Do not drive for 24 hours if you were given a sedative during the procedure.  Follow instructions from your health care provider about what to eat or drink after your procedure.  Return to your normal activities as told by your health care provider. This information is not intended to replace advice given to you by your health care provider. Make sure you discuss any questions you have with your health care provider. Document Released: 06/17/2012 Document Revised: 05/19/2018 Document Reviewed: 05/19/2018 Elsevier Interactive Patient Education  2019 [employer].

## 2019-04-01 NOTE — Op Note (Signed)
[facility] Patient Name: Martin Frazier Procedure Date: 04/01/2019 10:34 AM MRN: [id number] Date of Birth: 10/31/1934 Attending MD: Lionel December , MD CSN: [id number] Age: 85 Admit Type: Outpatient Procedure:                Upper GI endoscopy Indications:              Epigastric abdominal pain, Abnormal CT of the GI                            tract Providers:                Lionel December, MD, Loma Messing B. Patsy Lager, RN, Pandora Leiter, Technician, Dyann Ruddle Referring MD:             Angela Cox, MD Medicines:                Lidocaine spray, Meperidine 20 mg IV, Midazolam 3                            mg IV Complications:            No immediate complications. Estimated Blood Loss:     Estimated blood loss was minimal. Procedure:                Pre-Anesthesia Assessment:                           - Prior to the procedure, a History and Physical                            was performed, and patient medications and                            allergies were reviewed. The patient's tolerance of                            previous anesthesia was also reviewed. The risks                            and benefits of the procedure and the sedation                            options and risks were discussed with the patient.                            All questions were answered, and informed consent                            was obtained. Prior Anticoagulants: The patient has                            taken no previous anticoagulant or antiplatelet  agents. ASA Grade Assessment: II - A patient with                            mild systemic disease. After reviewing the risks                            and benefits, the patient was deemed in                            satisfactory condition to undergo the procedure.                           After obtaining informed consent, the endoscope was                            passed under direct  vision. Throughout the                            procedure, the patient's blood pressure, pulse, and                            oxygen saturations were monitored continuously. The                            [id number]-H190 ([id number]) scope was introduced through the                            mouth, and advanced to the second part of duodenum.                            The upper GI endoscopy was accomplished without                            difficulty. The patient tolerated the procedure                            well. Scope In: 11:16:47 AM Scope Out: 11:24:51 AM Total Procedure Duration: 0 hours 8 minutes 4 seconds  Findings:      The examined esophagus was normal.      The Z-line was regular and was found 43 cm from the incisors.      Patchy mildly erythematous and edematous mucosa without bleeding was       found in the prepyloric region of the stomach. Biopsies were taken with       a cold forceps for histology.      The exam of the stomach was otherwise normal.      The duodenal bulb and second portion of the duodenum were normal. Impression:               - Normal esophagus.                           - Z-line regular, 43 cm from the incisors.                           -  Erythematous and edematous mucosa in the                            prepyloric region of the stomach. Biopsied.                           - Normal duodenal bulb and second portion of the                            duodenum.                           Comment: no abnormality noted to explain patient's                            epigastric pain. Moderate Sedation:      Moderate (conscious) sedation was administered by the endoscopy nurse       and supervised by the endoscopist. The following parameters were       monitored: oxygen saturation, heart rate, blood pressure, CO2       capnography and response to care. Total physician intraservice time was       15 minutes. Recommendation:           - Patient has a contact  number available for                            emergencies. The signs and symptoms of potential                            delayed complications were discussed with the                            patient. Return to normal activities tomorrow.                            Written discharge instructions were provided to the                            patient.                           - Resume previous diet today.                           - Continue present medications.                           - No aspirin, ibuprofen, naproxen, or other                            non-steroidal anti-inflammatory drugs.                           - Await pathology results.                           - Notify if pain recurs.  Procedure Code(s):        --- Professional ---                           [phone], Esophagogastroduodenoscopy, flexible,                            transoral; with biopsy, single or multiple                           G0500, Moderate sedation services provided by the                            same physician or other qualified health care                            professional performing a gastrointestinal                            endoscopic service that sedation supports,                            requiring the presence of an independent trained                            observer to assist in the monitoring of the                            patient's level of consciousness and physiological                            status; initial 15 minutes of intra-service time;                            patient age 48 years or older (additional time may                            be reported with [id number], as appropriate) Diagnosis Code(s):        --- Professional ---                           K31.89, Other diseases of stomach and duodenum                           R10.13, Epigastric pain                           R93.3, Abnormal findings on diagnostic imaging of                            other parts  of digestive tract CPT copyright 2019 American Medical Association. All rights reserved. The codes documented in this report are preliminary and upon coder review may  be revised to meet current compliance requirements. Lionel December, MD Lionel December, MD 04/01/2019 11:35:30 AM This report has been signed electronically. Number of Addenda: 0

## 2019-04-03 ENCOUNTER — Inpatient Hospital Stay (HOSPITAL_COMMUNITY): Payer: Medicare Other | Admitting: Certified Registered"

## 2019-04-03 ENCOUNTER — Encounter (HOSPITAL_COMMUNITY)
Admission: AD | Disposition: A | Discharge status: Home / Self Care | Payer: Self-pay | Source: Ambulatory Visit | Attending: Urology

## 2019-04-03 ENCOUNTER — Encounter (HOSPITAL_COMMUNITY): Payer: Self-pay | Admitting: *Deleted

## 2019-04-03 ENCOUNTER — Other Ambulatory Visit: Payer: Self-pay

## 2019-04-03 ENCOUNTER — Observation Stay (HOSPITAL_COMMUNITY): Payer: Medicare Other

## 2019-04-03 ENCOUNTER — Other Ambulatory Visit: Payer: Self-pay | Admitting: Urology

## 2019-04-03 ENCOUNTER — Observation Stay (HOSPITAL_COMMUNITY)
Admission: AD | Admit: 2019-04-03 | Discharge: 2019-04-04 | Disposition: A | Discharge status: Home / Self Care | Payer: Medicare Other | Source: Ambulatory Visit | Attending: Urology | Admitting: Urology

## 2019-04-03 DIAGNOSIS — Z882 Allergy status to sulfonamides status: Secondary | ICD-10-CM | POA: Diagnosis not present

## 2019-04-03 DIAGNOSIS — K219 Gastro-esophageal reflux disease without esophagitis: Secondary | ICD-10-CM | POA: Diagnosis present

## 2019-04-03 DIAGNOSIS — N136 Pyonephrosis: Principal | ICD-10-CM | POA: Insufficient documentation

## 2019-04-03 DIAGNOSIS — I10 Essential (primary) hypertension: Secondary | ICD-10-CM | POA: Diagnosis not present

## 2019-04-03 DIAGNOSIS — Z87891 Personal history of nicotine dependence: Secondary | ICD-10-CM | POA: Insufficient documentation

## 2019-04-03 DIAGNOSIS — E119 Type 2 diabetes mellitus without complications: Secondary | ICD-10-CM

## 2019-04-03 HISTORY — DX: Other pulmonary collapse: J98.19

## 2019-04-03 HISTORY — PX: CYSTOSCOPY W/ URETERAL STENT PLACEMENT: SHX1429

## 2019-04-03 LAB — BASIC METABOLIC PANEL
Anion gap: 11 (ref 5–15)
BUN: 22 mg/dL (ref 8–23)
CO2: 24 mmol/L (ref 22–32)
Calcium: 9.4 mg/dL (ref 8.9–10.3)
Chloride: 101 mmol/L (ref 98–111)
Creatinine, Ser: 1.33 mg/dL — ABNORMAL HIGH (ref 0.61–1.24)
GFR calc Af Amer: 57 mL/min — ABNORMAL LOW (ref >60)
GFR calc non Af Amer: 49 mL/min — ABNORMAL LOW (ref >60)
Glucose, Bld: 181 mg/dL — ABNORMAL HIGH (ref 70–99)
Potassium: 4 mmol/L (ref 3.5–5.1)
Sodium: 136 mmol/L (ref 135–145)

## 2019-04-03 LAB — CBC
HCT: 40.2 % (ref 39.0–52.0)
Hemoglobin: 13.4 g/dL (ref 13.0–17.0)
MCH: 32.4 pg (ref 26.0–34.0)
MCHC: 33.3 g/dL (ref 30.0–36.0)
MCV: 97.3 fL (ref 80.0–100.0)
Platelets: 187 10*3/uL (ref 150–400)
RBC: 4.13 MIL/uL — ABNORMAL LOW (ref 4.22–5.81)
RDW: 12.8 % (ref 11.5–15.5)
WBC: 12.2 10*3/uL — ABNORMAL HIGH (ref 4.0–10.5)
nRBC: 0 % (ref 0.0–0.2)

## 2019-04-03 LAB — GLUCOSE, CAPILLARY
Glucose-Capillary: 150 mg/dL — ABNORMAL HIGH (ref 70–99)
Glucose-Capillary: 151 mg/dL — ABNORMAL HIGH (ref 70–99)

## 2019-04-03 SURGERY — CYSTOSCOPY, WITH RETROGRADE PYELOGRAM AND URETERAL STENT INSERTION
Anesthesia: General | Laterality: Right

## 2019-04-03 MED ORDER — OXYBUTYNIN CHLORIDE 5 MG PO TABS
5.0000 mg | ORAL_TABLET | Freq: Three times a day (TID) | ORAL | Status: DC | PRN
  Administered 2019-04-03: 5 mg via ORAL
  Filled 2019-04-03 (×2): qty 1

## 2019-04-03 MED ORDER — FENTANYL CITRATE (PF) 100 MCG/2ML IJ SOLN
25.0000 ug | INTRAMUSCULAR | Status: DC | PRN
  Administered 2019-04-03: 18:00:00 25 ug via INTRAVENOUS

## 2019-04-03 MED ORDER — ONDANSETRON HCL 4 MG/2ML IJ SOLN
INTRAMUSCULAR | Status: AC
  Filled 2019-04-03: qty 2

## 2019-04-03 MED ORDER — ONDANSETRON HCL 4 MG/2ML IJ SOLN: 4.0000 mg | INTRAMUSCULAR | Status: DC | PRN

## 2019-04-03 MED ORDER — FENTANYL CITRATE (PF) 250 MCG/5ML IJ SOLN
INTRAMUSCULAR | Status: DC | PRN
  Administered 2019-04-03: 50 ug via INTRAVENOUS
  Administered 2019-04-03: 25 ug via INTRAVENOUS

## 2019-04-03 MED ORDER — ONDANSETRON HCL 4 MG/2ML IJ SOLN: 4.0000 mg | Freq: Once | INTRAMUSCULAR | Status: DC | PRN

## 2019-04-03 MED ORDER — HYDROCODONE-ACETAMINOPHEN 5-325 MG PO TABS
1.0000 | ORAL_TABLET | ORAL | Status: DC | PRN
  Administered 2019-04-03 – 2019-04-04 (×2): 1 via ORAL
  Filled 2019-04-03: qty 1
  Filled 2019-04-03: qty 2

## 2019-04-03 MED ORDER — OXYCODONE HCL 5 MG PO TABS: 5.0000 mg | ORAL_TABLET | Freq: Once | ORAL | Status: DC | PRN

## 2019-04-03 MED ORDER — SENNA 8.6 MG PO TABS
1.0000 | ORAL_TABLET | Freq: Two times a day (BID) | ORAL | Status: DC
  Administered 2019-04-03 – 2019-04-04 (×2): 8.6 mg via ORAL
  Filled 2019-04-03 (×2): qty 1

## 2019-04-03 MED ORDER — LACTATED RINGERS IV SOLN
INTRAVENOUS | Status: DC
  Administered 2019-04-03: 17:00:00 via INTRAVENOUS

## 2019-04-03 MED ORDER — INSULIN ASPART 100 UNIT/ML ~~LOC~~ SOLN
0.0000 [IU] | Freq: Three times a day (TID) | SUBCUTANEOUS | Status: DC
  Administered 2019-04-04: 2 [IU] via SUBCUTANEOUS

## 2019-04-03 MED ORDER — SODIUM CHLORIDE 0.9 % IV SOLN
1.0000 g | INTRAVENOUS | Status: DC
  Filled 2019-04-03: qty 10

## 2019-04-03 MED ORDER — OXYCODONE HCL 5 MG/5ML PO SOLN: 5.0000 mg | Freq: Once | ORAL | Status: DC | PRN

## 2019-04-03 MED ORDER — ONDANSETRON HCL 4 MG/2ML IJ SOLN
INTRAMUSCULAR | Status: DC | PRN
  Administered 2019-04-03: 4 mg via INTRAVENOUS

## 2019-04-03 MED ORDER — FENTANYL CITRATE (PF) 100 MCG/2ML IJ SOLN
INTRAMUSCULAR | Status: AC
  Filled 2019-04-03: qty 2

## 2019-04-03 MED ORDER — ACETAMINOPHEN 325 MG PO TABS: 650.0000 mg | ORAL_TABLET | ORAL | Status: DC | PRN

## 2019-04-03 MED ORDER — LIDOCAINE 2% (20 MG/ML) 5 ML SYRINGE
INTRAMUSCULAR | Status: DC | PRN
  Administered 2019-04-03: 60 mg via INTRAVENOUS

## 2019-04-03 MED ORDER — ENOXAPARIN SODIUM 40 MG/0.4ML ~~LOC~~ SOLN
40.0000 mg | SUBCUTANEOUS | Status: DC
  Filled 2019-04-03: qty 0.4

## 2019-04-03 MED ORDER — IOHEXOL 300 MG/ML  SOLN
INTRAMUSCULAR | Status: DC | PRN
  Administered 2019-04-03: 18:00:00 10 mL

## 2019-04-03 MED ORDER — SUCCINYLCHOLINE CHLORIDE 200 MG/10ML IV SOSY
PREFILLED_SYRINGE | INTRAVENOUS | Status: DC | PRN
  Administered 2019-04-03: 120 mg via INTRAVENOUS

## 2019-04-03 MED ORDER — TAMSULOSIN HCL 0.4 MG PO CAPS
0.4000 mg | ORAL_CAPSULE | Freq: Every day | ORAL | Status: DC
  Administered 2019-04-04: 09:00:00 0.4 mg via ORAL
  Filled 2019-04-03: qty 1

## 2019-04-03 MED ORDER — SUCCINYLCHOLINE CHLORIDE 200 MG/10ML IV SOSY
PREFILLED_SYRINGE | INTRAVENOUS | Status: AC
  Filled 2019-04-03: qty 10

## 2019-04-03 MED ORDER — PROPOFOL 10 MG/ML IV BOLUS
INTRAVENOUS | Status: AC
  Filled 2019-04-03: qty 20

## 2019-04-03 MED ORDER — PANTOPRAZOLE SODIUM 40 MG PO TBEC
40.0000 mg | DELAYED_RELEASE_TABLET | Freq: Every day | ORAL | Status: DC
  Administered 2019-04-04: 40 mg via ORAL
  Filled 2019-04-03: qty 1

## 2019-04-03 MED ORDER — RAMIPRIL 2.5 MG PO CAPS
2.5000 mg | ORAL_CAPSULE | Freq: Every day | ORAL | Status: DC
  Administered 2019-04-04: 2.5 mg via ORAL
  Filled 2019-04-03: qty 1

## 2019-04-03 MED ORDER — STERILE WATER FOR IRRIGATION IR SOLN
Status: DC | PRN
  Administered 2019-04-03: 3000 mL

## 2019-04-03 MED ORDER — PROPOFOL 10 MG/ML IV BOLUS
INTRAVENOUS | Status: DC | PRN
  Administered 2019-04-03: 130 mg via INTRAVENOUS

## 2019-04-03 MED ORDER — SODIUM CHLORIDE 0.45 % IV SOLN
INTRAVENOUS | Status: DC
  Administered 2019-04-03: 20:00:00 via INTRAVENOUS

## 2019-04-03 MED ORDER — LIDOCAINE 2% (20 MG/ML) 5 ML SYRINGE
INTRAMUSCULAR | Status: AC
  Filled 2019-04-03: qty 5

## 2019-04-03 SURGICAL SUPPLY — 12 items
BAG URO CATCHER STRL LF (MISCELLANEOUS) ×2 IMPLANT
CATH INTERMIT  6FR 70CM (CATHETERS) IMPLANT
CLOTH BEACON ORANGE TIMEOUT ST (SAFETY) ×2 IMPLANT
GLOVE BIOGEL M 8.0 STRL (GLOVE) ×2 IMPLANT
GOWN STRL REUS W/ TWL XL LVL3 (GOWN DISPOSABLE) ×1 IMPLANT
GOWN STRL REUS W/TWL LRG LVL3 (GOWN DISPOSABLE) ×2 IMPLANT
GOWN STRL REUS W/TWL XL LVL3 (GOWN DISPOSABLE) ×2
GUIDEWIRE STR DUAL SENSOR (WIRE) ×2 IMPLANT
MANIFOLD NEPTUNE II (INSTRUMENTS) ×2 IMPLANT
PACK CYSTO (CUSTOM PROCEDURE TRAY) ×2 IMPLANT
STENT URET 6FRX24 CONTOUR (STENTS) ×1 IMPLANT
TUBING CONNECTING 10 (TUBING) ×2 IMPLANT

## 2019-04-03 NOTE — Progress Notes (Signed)
Patient arrived from PACU. Alert and oriented x 4. Vital signs stable. RN will continue to monitor patient.

## 2019-04-03 NOTE — Anesthesia Preprocedure Evaluation (Signed)
Anesthesia Evaluation  Patient identified by MRN, date of birth, ID band Patient awake    Reviewed: Allergy & Precautions, NPO status , Patient's Chart, lab work & pertinent test results  Airway Mallampati: II  TM Distance: >3 FB Neck ROM: Full    Dental  (+) Partial Upper, Dental Advisory Given   Pulmonary former smoker,    breath sounds clear to auscultation       Cardiovascular hypertension,  Rhythm:Regular Rate:Normal     Neuro/Psych    GI/Hepatic   Endo/Other  diabetes  Renal/GU      Musculoskeletal   Abdominal   Peds  Hematology   Anesthesia Other Findings   Reproductive/Obstetrics                             Anesthesia Physical Anesthesia Plan  ASA: III and emergent  Anesthesia Plan: General   Post-op Pain Management:    Induction: Intravenous  PONV Risk Score and Plan: Ondansetron  Airway Management Planned: Oral ETT  Additional Equipment:   Intra-op Plan:   Post-operative Plan: Extubation in OR  Informed Consent: I have reviewed the patients History and Physical, chart, labs and discussed the procedure including the risks, benefits and alternatives for the proposed anesthesia with the patient or authorized representative who has indicated his/her understanding and acceptance.     Dental advisory given  Plan Discussed with: CRNA and Anesthesiologist  Anesthesia Plan Comments:         Anesthesia Quick Evaluation

## 2019-04-03 NOTE — Transfer of Care (Signed)
Immediate Anesthesia Transfer of Care Note  Patient: Martin Frazier.  Procedure(s) Performed: CYSTOSCOPY WITH RETROGRADE PYELOGRAM/URETERAL STENT PLACEMENT (Right )  Patient Location: PACU  Anesthesia Type:General  Level of Consciousness: awake, alert  and oriented  Airway & Oxygen Therapy: Patient Spontanous Breathing and Patient connected to face mask oxygen  Post-op Assessment: Report given to RN and Post -op Vital signs reviewed and stable  Post vital signs: Reviewed and stable  Last Vitals:  Vitals Value Taken Time  BP 157/82 04/03/2019  5:51 PM  Temp    Pulse 98 04/03/2019  5:52 PM  Resp 12 04/03/2019  5:52 PM  SpO2 100 % 04/03/2019  5:52 PM  Vitals shown include unvalidated device data.  Last Pain:  Vitals:   04/03/19 1645  TempSrc: Oral  PainSc: 1       Patients Stated Pain Goal: 0 (04/03/19 1645)  Complications: No apparent anesthesia complications

## 2019-04-03 NOTE — H&P (Signed)
H&P  Chief Complaint: Kidney s one  History of Present Illness: 85 year old male presents for rt ureteral stent placement for mgmt of an infected rt UPJ stone.  He has known h/o urolithiasis and presented to our office with flank pain and SOB. CT--stable large rt lower pole stone and 6 mm rt UPJ stone. U/A significant for infection.  Past Medical History:  Diagnosis Date  . Diabetes mellitus without complication ([facility])    over 10 yrs  . GERD (gastroesophageal reflux disease)   . Hypertension   . Kidney stones     Past Surgical History:  Procedure Laterality Date  . HEMORRHOID SURGERY    . KIDNEY STONE SURGERY    . TONSILLECTOMY      Home Medications:  Allergies as of 04/03/2019      Reactions   Sulfa Antibiotics Nausea And Vomiting      Medication List    Notice   Cannot display discharge medications because the patient has not yet been admitted.     Allergies:  Allergies  Allergen Reactions  . Sulfa Antibiotics Nausea And Vomiting    No family history on file.  Social History:  reports that he has quit smoking. His smoking use included cigarettes. He has never used smokeless tobacco. He reports that he does not drink alcohol or use drugs.  ROS: A complete review of systems was performed.  All systems are negative except for pertinent findings as noted.  Physical Exam:  Vital signs in last 24 hours:   Constitutional:  Alert and oriented, No acute distress Cardiovascular: Regular rate  Respiratory: Normal respiratory effort GI: Abdomen is soft, nontender, nondistended, no abdominal masses. No CVAT.  Genitourinary: Normal male phallus, testes are descended bilaterally and non-tender and without masses, scrotum is normal in appearance without lesions or masses, perineum is normal on inspection. Lymphatic: No lymphadenopathy Neurologic: Grossly intact, no focal deficits Psychiatric: Normal mood and affect  Laboratory Data:  No results for input(s): WBC, HGB,  HCT, PLT in the last 72 hours.  No results for input(s): NA, K, CL, GLUCOSE, BUN, CALCIUM, CREATININE in the last 72 hours.  Invalid input(s): CO3   No results found for this or any previous visit (from the past 24 hour(s)). No results found for this or any previous visit (from the past 240 hour(s)).  Renal Function: No results for input(s): CREATININE in the last 168 hours. CrCl cannot be calculated (Patient's most recent lab result is older than the maximum 21 days allowed.).  Radiologic Imaging: No results found.  Impression/Assessment:  Rt UPJ stone, UTI  Plan:  Cysto, rt RGP, RT J2 stent

## 2019-04-03 NOTE — Op Note (Signed)
Preoperative diagnosis: Right proximal ureteral stone with infection  Postoperative diagnosis: Same  Principal procedure: Cystoscopy, right retrograde ureteropyelogram, fluoroscopic interpretation, placement of 6 French by 24 cm contour double-J stent without tether  Surgeon: Justis Closser  Anesthesia: General endotracheal  Complications: None  Drains: None  Specimen: Urine for culture  Estimated blood loss: None  Indications: 85 year old male recently presenting with right flank pain, infected urine.  The patient came to our office with the above findings, CT scan revealed a 6 mm proximal right ureteral stone with hydronephrosis.  He presents at this time for urgent stenting and management of his urinary tract infection.  I discussed the procedure of cystoscopy, retrograde ureteropyelogram and double-J stent placement with the patient.  I discussed the need for hospitalization afterwards.  He understands and desires to proceed.  Findings: Prostate was obstructing from bilobar hypertrophy.  There was a prominent median lobe.  Bladder was then inspected circumferentially.  Ureteral orifices were somewhat difficult to find but were present bilaterally and of normal configuration.  Urothelium revealed no lesions but there was significant trabeculation and mild generalized erythema, patchy consistent with urinary tract infection.  I did not see stones.  Once cannulated, retrograde pyelogram revealed a normal right ureter without hydronephrosis.  There was a filling defect at the right UPJ consistent with the previously mentioned stone.  There was mild pyelocaliectasis.  There was a lower pole filling defect consistent with the larger stone seen on CT scan.  Description of procedure: The patient was properly identified and marked in the holding area.  He had received Rocephin earlier in the office.  He was taken to the operating room where general anesthetic was administered.  He was placed in the  dorsolithotomy position.  Genitalia and perineum were prepped and draped.  Proper timeout was performed.  21 French panendoscope advanced through the urethra with normal findings.  There was mild obstruction of the prostatic urethra.  Once entered, the bladder was systematically inspected with the above-mentioned findings.  The right ureteral orifice was cannulated with a 6 [place] open-ended catheter and using Omnipaque retrograde ureteropyelogram was performed with again the above-mentioned findings.  Once cannulated, the sensor tip guidewire was passed through the open-ended catheter and guided up into the upper pole calyceal system fluoroscopically.  The open-ended catheter was then removed and then a 24 cm x 6 French contour double-J stent with tether removed was placed, and deployed in the ureter once the guidewire was removed.  Excellent proximal and distal curls were then seen using fluoroscopic and cystoscopic identification.  At this point the bladder was drained and the scope removed.  The patient was then awakened and taken to the PACU in stable condition.  He tolerated the procedure well.

## 2019-04-03 NOTE — Anesthesia Procedure Notes (Signed)
Procedure Name: Intubation Date/Time: 04/03/2019 5:19 PM Performed by: Niel Hummer, CRNA Pre-anesthesia Checklist: Patient identified, Emergency Drugs available, Suction available and Patient being monitored Patient Re-evaluated:Patient Re-evaluated prior to induction Oxygen Delivery Method: Circle system utilized Preoxygenation: Pre-oxygenation with 100% oxygen Induction Type: IV induction and Rapid sequence Laryngoscope Size: Mac and 4 Grade View: Grade I Tube type: Oral Tube size: 7.5 mm Number of attempts: 1 Airway Equipment and Method: Stylet Placement Confirmation: positive ETCO2,  ETT inserted through vocal cords under direct vision and breath sounds checked- equal and bilateral Secured at: 22 cm Tube secured with: Tape Dental Injury: Teeth and Oropharynx as per pre-operative assessment

## 2019-04-04 DIAGNOSIS — N136 Pyonephrosis: Secondary | ICD-10-CM | POA: Diagnosis not present

## 2019-04-04 LAB — BASIC METABOLIC PANEL
Anion gap: 9 (ref 5–15)
BUN: 22 mg/dL (ref 8–23)
CO2: 25 mmol/L (ref 22–32)
Calcium: 8.6 mg/dL — ABNORMAL LOW (ref 8.9–10.3)
Chloride: 101 mmol/L (ref 98–111)
Creatinine, Ser: 1.51 mg/dL — ABNORMAL HIGH (ref 0.61–1.24)
GFR calc Af Amer: 49 mL/min — ABNORMAL LOW (ref >60)
GFR calc non Af Amer: 42 mL/min — ABNORMAL LOW (ref >60)
Glucose, Bld: 168 mg/dL — ABNORMAL HIGH (ref 70–99)
Potassium: 4.2 mmol/L (ref 3.5–5.1)
Sodium: 135 mmol/L (ref 135–145)

## 2019-04-04 LAB — CBC
HCT: 37.8 % — ABNORMAL LOW (ref 39.0–52.0)
Hemoglobin: 12.3 g/dL — ABNORMAL LOW (ref 13.0–17.0)
MCH: 32.2 pg (ref 26.0–34.0)
MCHC: 32.5 g/dL (ref 30.0–36.0)
MCV: 99 fL (ref 80.0–100.0)
Platelets: 153 K/uL (ref 150–400)
RBC: 3.82 MIL/uL — ABNORMAL LOW (ref 4.22–5.81)
RDW: 13 % (ref 11.5–15.5)
WBC: 13.3 K/uL — ABNORMAL HIGH (ref 4.0–10.5)
nRBC: 0 % (ref 0.0–0.2)

## 2019-04-04 LAB — GLUCOSE, CAPILLARY
Glucose-Capillary: 146 mg/dL — ABNORMAL HIGH (ref 70–99)
Glucose-Capillary: 196 mg/dL — ABNORMAL HIGH (ref 70–99)

## 2019-04-04 MED ORDER — CEPHALEXIN 500 MG PO CAPS: 500.0000 mg | ORAL_CAPSULE | Freq: Two times a day (BID) | ORAL | 1 refills | Status: DC

## 2019-04-04 MED ORDER — PHENAZOPYRIDINE HCL 200 MG PO TABS: 200.0000 mg | ORAL_TABLET | Freq: Three times a day (TID) | ORAL | 1 refills | Status: DC | PRN

## 2019-04-04 NOTE — Anesthesia Postprocedure Evaluation (Signed)
Anesthesia Post Note  Patient: Martin Frazier.  Procedure(s) Performed: CYSTOSCOPY WITH RETROGRADE PYELOGRAM/URETERAL STENT PLACEMENT (Right )     Anesthesia Post Evaluation  Last Vitals:  Vitals:   04/04/19 0024 04/04/19 0417  BP: (!) 108/56 (!) 106/58  Pulse: 99 79  Resp: 18 18  Temp: 36.8 C 37 C  SpO2: 96% 96%    Last Pain:  Vitals:   04/04/19 0417  TempSrc: Oral  PainSc:                  Malva Diesing COKER

## 2019-04-04 NOTE — Discharge Instructions (Signed)

## 2019-04-04 NOTE — Discharge Summary (Addendum)
[facility] Discharge Summary  Admit date: 04/03/2019  Discharge date and time: 04/04/19   Discharge to: Home  Discharge Service: Urology  Discharge Attending Physician:  Dr. Retta Diones  Discharge  Diagnoses: Ureteral stone with infection   Secondary Diagnosis: Active Problems:   * No active hospital problems. *   OR Procedures: Procedure(s): CYSTOSCOPY WITH RETROGRADE PYELOGRAM/URETERAL STENT PLACEMENT 04/03/2019   Ancillary Procedures: None   Discharge Day Services: The patient was seen and examined by the Urology team both in the morning and immediately prior to discharge.  Vital signs and laboratory values were stable and within normal limits.  The physical exam was benign and unchanged and all surgical wounds were examined.  Discharge instructions were explained and all questions answered.  Subjective  No acute events overnight. Pain Controlled. No fever or chills.  Objective Patient Vitals for the past 8 hrs:  BP Temp Temp src Pulse Resp SpO2  04/04/19 0417 (!) 106/58 98.6 F (37 C) Oral 79 18 96 %   Total I/O In: 120 [P.O.:120] Out: -   General Appearance:        No acute distress Lungs:                       Normal work of breathing on room air Heart:                                Regular rate and rhythm Abdomen:                         Soft, non-tender, non-distended Extremities:                      Warm and well perfused   Hospital Course:  The patient underwent a Right ureteral stent placement for infected 6 mm proximal stone on 04/03/2019.  The patient tolerated the procedure well, was extubated in the OR, and afterwards was taken to the PACU for routine post-surgical care. When stable the patient was transferred to the floor.   The patient did well postoperatively.  The patient's diet was slowly advanced and at the time of discharge was tolerating a regular diet.  The patient was discharged home 1 Day Post-Op, at which point was tolerating a regular solid  diet, was able to void spontaneously, have adequate pain control with P.O. pain medication, and could ambulate without difficulty. The patient will follow up with [place] for post op check.   Urine culture pending at time of discharge Will send home on empiric PO Cefdinir and we will follow up culture results He will be scheduled for return visit and anticipation of definitive stone surgery in the next couple of weeks  Condition at Discharge: Improved  Discharge Medications:  Allergies as of 04/04/2019      Reactions   Sulfa Antibiotics Nausea And Vomiting      Medication List    STOP taking these medications   cefdinir 300 MG capsule Commonly known as:  OMNICEF     TAKE these medications   ACIDOPHILUS PO Take 1 capsule by mouth daily.   cephALEXin 500 MG capsule Commonly known as:  Keflex Take 1 capsule (500 mg total) by mouth 2 (two) times daily.   dexlansoprazole 60 MG capsule Commonly known as:  DEXILANT Take 60 mg by mouth daily.   GINKOBA PO Take 1 tablet by mouth  daily.   Magnesium 250 MG Tabs Take 1 tablet by mouth daily.   metFORMIN 500 MG tablet Commonly known as:  GLUCOPHAGE Take 500 mg by mouth daily with breakfast.   ramipril 2.5 MG capsule Commonly known as:  ALTACE Take 2.5 mg by mouth daily.   SAW PALMETTO PO Take 1 capsule by mouth daily.   tamsulosin 0.4 MG Caps capsule Commonly known as:  FLOMAX Take 0.4 mg by mouth daily.   vitamin B-12 1000 MCG tablet Commonly known as:  CYANOCOBALAMIN Take 1,000 mcg by mouth daily.   Vitamin D 50 MCG (2000 UT) Caps Take 1 capsule by mouth daily.   vitamin E 400 UNIT capsule Take 400 Units by mouth daily.

## 2019-04-04 NOTE — Progress Notes (Signed)
AVS given to patient and explained at the bedside. Medications and follow up appointments have been explained with pt verbalizing understanding.  

## 2019-04-06 ENCOUNTER — Encounter (HOSPITAL_COMMUNITY): Payer: Self-pay | Admitting: Urology

## 2019-04-06 LAB — URINE CULTURE: Culture: 1000 — AB

## 2019-04-16 ENCOUNTER — Encounter (HOSPITAL_COMMUNITY)
Admission: RE | Disposition: A | Discharge status: Home / Self Care | Payer: Self-pay | Source: Home / Self Care | Attending: Urology

## 2019-04-16 ENCOUNTER — Encounter (HOSPITAL_COMMUNITY): Payer: Self-pay | Admitting: Anesthesiology

## 2019-04-16 ENCOUNTER — Other Ambulatory Visit: Payer: Self-pay

## 2019-04-16 ENCOUNTER — Ambulatory Visit (HOSPITAL_COMMUNITY): Payer: Medicare Other

## 2019-04-16 ENCOUNTER — Encounter (HOSPITAL_COMMUNITY): Payer: Self-pay | Admitting: *Deleted

## 2019-04-16 ENCOUNTER — Other Ambulatory Visit: Payer: Self-pay | Admitting: Urology

## 2019-04-16 ENCOUNTER — Ambulatory Visit (HOSPITAL_COMMUNITY)
Admission: RE | Admit: 2019-04-16 | Discharge: 2019-04-16 | Disposition: A | Discharge status: Home / Self Care | Payer: Medicare Other | Attending: Urology | Admitting: Urology

## 2019-04-16 DIAGNOSIS — N200 Calculus of kidney: Secondary | ICD-10-CM | POA: Diagnosis present

## 2019-04-16 DIAGNOSIS — I10 Essential (primary) hypertension: Secondary | ICD-10-CM | POA: Insufficient documentation

## 2019-04-16 DIAGNOSIS — E1139 Type 2 diabetes mellitus with other diabetic ophthalmic complication: Secondary | ICD-10-CM | POA: Diagnosis not present

## 2019-04-16 DIAGNOSIS — N201 Calculus of ureter: Secondary | ICD-10-CM | POA: Insufficient documentation

## 2019-04-16 DIAGNOSIS — R338 Other retention of urine: Secondary | ICD-10-CM | POA: Diagnosis not present

## 2019-04-16 DIAGNOSIS — K219 Gastro-esophageal reflux disease without esophagitis: Secondary | ICD-10-CM | POA: Diagnosis not present

## 2019-04-16 DIAGNOSIS — H42 Glaucoma in diseases classified elsewhere: Secondary | ICD-10-CM | POA: Diagnosis not present

## 2019-04-16 DIAGNOSIS — Z8249 Family history of ischemic heart disease and other diseases of the circulatory system: Secondary | ICD-10-CM | POA: Insufficient documentation

## 2019-04-16 DIAGNOSIS — Z882 Allergy status to sulfonamides status: Secondary | ICD-10-CM | POA: Diagnosis not present

## 2019-04-16 DIAGNOSIS — Z7984 Long term (current) use of oral hypoglycemic drugs: Secondary | ICD-10-CM | POA: Insufficient documentation

## 2019-04-16 DIAGNOSIS — Z79899 Other long term (current) drug therapy: Secondary | ICD-10-CM | POA: Insufficient documentation

## 2019-04-16 DIAGNOSIS — Z87891 Personal history of nicotine dependence: Secondary | ICD-10-CM | POA: Diagnosis not present

## 2019-04-16 HISTORY — PX: EXTRACORPOREAL SHOCK WAVE LITHOTRIPSY: SHX1557

## 2019-04-16 LAB — GLUCOSE, CAPILLARY
Glucose-Capillary: 166 mg/dL — ABNORMAL HIGH (ref 70–99)
Glucose-Capillary: 126 mg/dL — ABNORMAL HIGH (ref 70–99)

## 2019-04-16 SURGERY — LITHOTRIPSY, ESWL
Anesthesia: Choice | Site: Ureter | Laterality: Right

## 2019-04-16 MED ORDER — SODIUM CHLORIDE 0.9 % IV SOLN
INTRAVENOUS | Status: DC
  Administered 2019-04-16: 12:00:00 via INTRAVENOUS

## 2019-04-16 MED ORDER — DIPHENHYDRAMINE HCL 25 MG PO CAPS
25.0000 mg | ORAL_CAPSULE | ORAL | Status: AC
  Administered 2019-04-16: 12:00:00 25 mg via ORAL
  Filled 2019-04-16: qty 1

## 2019-04-16 MED ORDER — CIPROFLOXACIN HCL 500 MG PO TABS
500.0000 mg | ORAL_TABLET | ORAL | Status: AC
  Administered 2019-04-16: 500 mg via ORAL
  Filled 2019-04-16: qty 1

## 2019-04-16 MED ORDER — OXYCODONE-ACETAMINOPHEN 5-325 MG PO TABS: 1.0000 | ORAL_TABLET | Freq: Four times a day (QID) | ORAL | 0 refills | Status: DC | PRN

## 2019-04-16 MED ORDER — DIAZEPAM 5 MG PO TABS
10.0000 mg | ORAL_TABLET | ORAL | Status: AC
  Administered 2019-04-16: 10 mg via ORAL
  Filled 2019-04-16: qty 2

## 2019-04-16 MED ORDER — SODIUM CHLORIDE 0.9 % IV SOLN
2.0000 g | Freq: Once | INTRAVENOUS | Status: AC
  Administered 2019-04-16: 2 g via INTRAVENOUS
  Filled 2019-04-16: qty 20

## 2019-04-16 NOTE — Op Note (Addendum)
Right distal stone 5 mm  Right ESWL  Findings: 5-6 mm right distal stone, prior stent in good position. Stone faded but did not disappear. He may need a staged procedure. He had a lot of pain even at energy of 1 which limited our ability to raise the power, but we eventually got it up to 6.   Pain rx given - I checked PMP Aware.

## 2019-04-16 NOTE — H&P (Signed)
Office Visit Report     04/14/2019   --------------------------------------------------------------------------------   Martin Frazier. [name]  MRN: [id number]  PRIMARY CARE:  Martin Frazier. Margo Common, MD  DOB: 1934/08/02, 85 year old Male  REFERRING:  Fayrene Fearing B. Doyne Keel, MD  SSN: [id number]  PROVIDER:  Wilkie Aye, M.D.    TREATING:  Vianne Bulls    LOCATION:  [facility] [phone]   --------------------------------------------------------------------------------   CC: I have urinary retention.  HPI: Martin Frazier is a 85 year-old male established patient who is here for urinary retention.  03/25/2019: The patient presented to [facility] on 3/12 and was diagnosed with an ulcer, urinary retention and a UTI. He was started on flomax but the prescription ran out and he has not taken it in 4 days ago. He had severe LUTS prior to hospitalization and he had been having severe LUTS for months prior to his hospitalization.   03/27/19: He returns today for follow up. He states that he actually his been using Flomax, as prescribed. He was confused which medications he was using at the time of evaluation by Dr. Ronne Binning. He would like to proceed with voiding trial today. He states that he is tolerating the catheter and it has been draining well. He has mild penile pain with voiding intermittently. He denies gross hematuria, fevers, or chills. He is scheduled for endoscopy next week.   04/03/19: Patient with above noted history. He underwent successful TOV one week ago. Today he presents with complaints of dysuria, gross hematuria, and incontinence. He also complains of right flank pain, which radiates to his RLQ. He states that this pain began acutely this morning. He does have past stone hx and feels that the pain is similar. He also notes increased frequency. He states that voiding symptoms began yesterday, as well. He denies difficulties voiding and currently feels he is emptying his bladder well. No  fevers. He is not on blood thinners. He denies cardiac history. No hx of CVA.    4.14.20: [name] returns today for follow up. He is s/p cystoscopy and right ureteral stent placement on 4/3 in the setting of infection. Urine culture at the time was positive for Escherichia coli. He was discharged on Keflex, but this was changed to Ceftin based on culture results. He completed this on 4/11. Today he states that he has been doing well since his procedure. He denies any current flank or abdominal pain. He states that he will have episodes of right flnak pain intermittently after actively. He denies difficulties voiding and feels he is emptying well. No bothersome frequency or urgency. He will have nocturia X every 3 hours. He will have mild leakage at night. He has some mild dysuria intermittently. No fevers, chills, nausea, or vomiting. He remain on Tamsulosin.   His problem was diagnosed 03/12/2019. His current symptoms did not begin after he had a surgical procedure. His urinary retention is being treated with flomax. Patient denies foley catheter, suprapubic tube, intemittent catheterization, hytrin, cardura, uroxatrol, rapaflo, avodart, and proscar.   He does have an abnormal sensation when needing to urinate. He does have to strain or bear down to start his urinary stream. He does not have a good size and strength to his urinary stream. He is having problems with emptying his bladder well. His urine has shut off completely.   He is not having problems with urinary control or incontinence.   He has previously had an indwelling catheter in for more than two  weeks at a time.     ALLERGIES: Sulfa Drugs    MEDICATIONS: Omeprazole  Tamsulosin Hcl 0.4 mg capsule 1 capsule PO Q HS  Acidophilus  Ginkgo  Glipizide  Magnesium  MetFORMIN HCl - 500 MG Oral Tablet Oral  Ramipril  Saw Palmetto CAPS Oral  Vitamin B12  Vitamin D  Vitamin E TABS Oral     GU PSH: Cystoscopy Insert Stent, Right -  04/03/2019      PSH Notes: Tonsillectomy, Appendectomy, Neuroplasty Decompression Median Nerve At Carpal Tunnel, Hemorrhoidectomy, Kidney Surgery   NON-GU PSH: Appendectomy - 2011 Carpal Tunnel Surgery.. - 2011 Hemorrhoidectomy (favorite) - 2011 Remove Tonsils - 2011    GU PMH: Dysuria - 04/03/2019 Flank Pain - 04/03/2019 Gross hematuria - 04/03/2019 Urinary Retention - 03/27/2019, - 03/25/2019 Bladder-neck stenosis/contracture, Bladder neck obstruction - 2014 BPH w/o LUTS, Benign prostatic hypertrophy without lower urinary tract symptoms - 2014 History of urolithiasis, Nephrolithiasis - 2014 Other microscopic hematuria, Microscopic hematuria - 2014 Renal calculus, Nephrolithiasis - 2014 Renal cyst, Renal cysts, acquired, bilateral - 2014      PMH Notes: stomach ulcer   NON-GU PMH: Cardiac murmur, unspecified, Murmurs - 2014 Personal history of other endocrine, nutritional and metabolic disease, History of diabetes mellitus - 2014 Personal history of other specified conditions, History of heartburn - 2014 Arthritis Diabetes Type 2 GERD Glaucoma Hypertension    FAMILY HISTORY: Acute Myocardial Infarction - Father Cancer - Runs in Family Death - Father Death In The Family Mother - Mother Diabetes - Runs in Family Family Health Status Number - Mother Stroke Syndrome - Mother   SOCIAL HISTORY: Marital Status: Married Preferred Language: English; Ethnicity: Not Hispanic Or Latino Current Smoking Status: Patient does not smoke anymore. Smoked for 25 years.   Tobacco Use Assessment Completed: Used Tobacco in last 30 days? Has never drank.  Drinks 2 caffeinated drinks per day. Patient's occupation is/was retired.     Notes: History of tobacco use, Alcohol Use, Occupation: Retired, Caffeine Use, Marital History - Currently Married   REVIEW OF SYSTEMS:    GU Review Male:   Patient reports get up at night to urinate. Patient denies frequent urination, hard to postpone urination,  burning/ pain with urination, leakage of urine, stream starts and stops, trouble starting your stream, have to strain to urinate , erection problems, and penile pain.  Gastrointestinal (Upper):   Patient denies nausea, vomiting, and indigestion/ heartburn.  Gastrointestinal (Lower):   Patient denies diarrhea and constipation.  Constitutional:   Patient denies fever, night sweats, weight loss, and fatigue.  Skin:   Patient denies skin rash/ lesion and itching.  Eyes:   Patient denies blurred vision and double vision.  Ears/ Nose/ Throat:   Patient denies sore throat and sinus problems.  Hematologic/Lymphatic:   Patient denies swollen glands and easy bruising.  Cardiovascular:   Patient denies leg swelling and chest pains.  Respiratory:   Patient denies cough and shortness of breath.  Endocrine:   Patient denies excessive thirst.  Musculoskeletal:   Patient denies back pain and joint pain.  Neurological:   Patient denies headaches and dizziness.  Psychologic:   Patient denies depression and anxiety.   VITAL SIGNS:      04/14/2019 09:19 AM  Weight 141 lb / 63.96 kg  Height 67 in / 170.18 cm  BP 125/70 mmHg  Pulse 87 /min  Temperature 97.6 F / 36.4 C  BMI 22.1 kg/m   MULTI-SYSTEM PHYSICAL EXAMINATION:    Constitutional: Well-nourished. No physical  deformities. Normally developed. Good grooming.  Respiratory: No labored breathing, no use of accessory muscles.   Cardiovascular: Normal temperature, normal extremity pulses, no swelling, no varicosities.  Neurologic / Psychiatric: Oriented to time, oriented to place, oriented to person. No depression, no anxiety, no agitation.  Gastrointestinal: No mass, no tenderness, no rigidity, non obese abdomen. No CVAT.   Musculoskeletal: Normal gait and station of head and neck.     PAST DATA REVIEWED:  Source Of History:  Patient  Records Review:   Previous Patient Records  Urine Test Review:   Urinalysis, Urine Culture  Urodynamics Review:    Review Bladder Scan  X-Ray Review: C.T. Abdomen/Pelvis: Reviewed Films. Reviewed Report.     PROCEDURES:         KUB - [id number]  A single view of the abdomen is obtained. Stable opacity noted in the right lower pole. Right ureteral stent appears to remain well posited. There continues to be an opacity noted in the vicinity of the right UPJ today. Prominent, but normal appearing bowel gas pattern. Stable pelvic phleboliths.       Patient confirmed No Neulasta OnPro Device.           Urinalysis w/Scope Dipstick Dipstick Cont'd Micro  Color: Yellow Bilirubin: Neg mg/dL WBC/hpf: 6 - 66/MAY  Appearance: Clear Ketones: Neg mg/dL RBC/hpf: 10 - 04/HTX  Specific Gravity: 1.020 Blood: 3+ ery/uL Bacteria: Few (10-25/hpf)  pH: 6.5 Protein: 1+ mg/dL Cystals: NS (Not Seen)  Glucose: Neg mg/dL Urobilinogen: 0.2 mg/dL Casts: NS (Not Seen)    Nitrites: Neg Trichomonas: Not Present    Leukocyte Esterase: 1+ leu/uL Mucous: Not Present      Epithelial Cells: NS (Not Seen)      Yeast: NS (Not Seen)      Sperm: Not Present    ASSESSMENT:      ICD-10 Details  1 GU:   Urinary Retention - R33.8   2   Renal calculus - N20.0 Stable   PLAN:            Medications Stop Meds: Cefdinir 300 mg capsule  Discontinue: 04/14/2019  - Reason: The medication cycle was completed.  Clotrimazole-Betamethasone 1 %-0.05 % cream Apply to affected area BID for 1-2 weeks  Start: 03/27/2019  Discontinue: 04/14/2019  - Reason: The medication cycle was completed.            Orders Labs Urine Culture  X-Rays: KUB          Schedule         Document Letter(s):  Created for Patient: Clinical Summary         Notes:   UA today looks improved, but I will repeat culture. We will keep him informed of urine culture results. On KUB imaging today, there is a stable opacity noted in the right lower pole. Right ureteral stent appears to remain well posited. There continues to be an opacity noted in the vicinity of the right UPJ  today. We reviewed treatment options in detail today including ESWL and URS.   For ureteroscopy I described the risks  which include general anesthetic complications, bleeding,  infection, damage to contiguous structures, positioning injury, ureteral  stricture, ureteral avulsion, ureteral injury, need for ureteral stent,  inability to perform ureteroscopy, need for an interval procedure, inability to  clear stone burden, stent discomfort and pain.   -For shockwave lithotripsy  I described the risks which include arrhythmia, kidney contusion, kidney  hemorrhage, need for transfusion, back discomfort, flank ecchymosis,  flank abrasion, inability to break up stone, inability to pass stone fragments, Steinstrasse, infection associated with obstructing stones, need for different surgical procedure and possible need for repeat shockwave lithotripsy.   Ultimately, I will review with his urologist for recommendations moving forward. I will be in touch regarding these results. He will remain on Tamsulosin at this time. Strict return precuations reviewed for fevers, difficulties voiding, or progressive pain, nausea, or vomiting. He voiced understanding.         Next Appointment:      Next Appointment: 05/06/2019 09:30 AM    Appointment Type: Office Visit Established Patient    Location: [facility] - [id number]    Provider: [facility]    Reason for Visit: 6 wk w PVR      * Signed by Vianne BullsBree Fleck on 04/14/19 at 12:13 PM (EDT)*     The information contained in this medical record document is considered private and confidential patient information. This information can only be used for the medical diagnosis and/or medical services that are being provided by the patient's selected caregivers. This information can only be distributed outside of the patient's care if the patient agrees and signs waivers of authorization for this information to be sent to an outside source or route.  Add; Urine cx from office  pending. I changed pre-op abx to ceftriaxone 2 g.

## 2019-04-16 NOTE — Discharge Instructions (Signed)

## 2019-04-16 NOTE — Interval H&P Note (Signed)
History and Physical Interval Note:  04/16/2019 3:51 PM  Martin Frazier.  has presented today for surgery, with the diagnosis of right ureteral calculus.  The various methods of treatment have been discussed with the patient and family. After consideration of risks, benefits and other options for treatment, the patient has consented to  Procedure(s): EXTRACORPOREAL SHOCK WAVE LITHOTRIPSY (ESWL) (Right) as a surgical intervention.  The patient's history has been reviewed, patient examined, no change in status, stable for surgery.  I have reviewed the patient's chart and labs. I gave him a po Cipro 500 mg and ceftriaxone 2 g given the stent and prior + urine cx. His urine cx in office just returned and was negative. The patient had right flank pain last night but it improved today. He hasnt seen a stone pass. It looks like the tone slipped down distally beside the stent. Discussed [name] will remove stent 04/23/2019.  Questions were answered to the patient's satisfaction.  He elects to proceed.     Jerilee Field

## 2019-04-17 ENCOUNTER — Encounter (HOSPITAL_COMMUNITY): Payer: Self-pay | Admitting: Urology

## 2019-04-22 ENCOUNTER — Encounter (HOSPITAL_COMMUNITY): Payer: Self-pay | Admitting: Certified Registered"

## 2019-06-10 ENCOUNTER — Ambulatory Visit (HOSPITAL_COMMUNITY)
Admission: RE | Admit: 2019-06-10 | Discharge: 2019-06-10 | Disposition: A | Discharge status: Home / Self Care | Payer: Medicare Other | Source: Ambulatory Visit | Attending: Urology | Admitting: Urology

## 2019-06-10 ENCOUNTER — Other Ambulatory Visit: Payer: Self-pay

## 2019-06-10 ENCOUNTER — Ambulatory Visit (INDEPENDENT_AMBULATORY_CARE_PROVIDER_SITE_OTHER): Payer: Medicare Other | Admitting: Urology

## 2019-06-10 ENCOUNTER — Other Ambulatory Visit (HOSPITAL_COMMUNITY): Payer: Self-pay | Admitting: Urology

## 2019-06-10 DIAGNOSIS — N200 Calculus of kidney: Secondary | ICD-10-CM

## 2019-06-10 DIAGNOSIS — N201 Calculus of ureter: Secondary | ICD-10-CM

## 2019-06-10 DIAGNOSIS — R338 Other retention of urine: Secondary | ICD-10-CM

## 2019-10-27 ENCOUNTER — Other Ambulatory Visit: Payer: Self-pay | Admitting: Urology

## 2019-10-27 ENCOUNTER — Other Ambulatory Visit (HOSPITAL_COMMUNITY): Payer: Self-pay | Admitting: Urology

## 2019-10-27 DIAGNOSIS — N200 Calculus of kidney: Secondary | ICD-10-CM

## 2019-11-30 ENCOUNTER — Other Ambulatory Visit: Payer: Self-pay

## 2019-11-30 ENCOUNTER — Ambulatory Visit (HOSPITAL_COMMUNITY)
Admission: RE | Admit: 2019-11-30 | Discharge: 2019-11-30 | Disposition: A | Discharge status: Home / Self Care | Payer: Medicare Other | Source: Ambulatory Visit | Attending: Urology | Admitting: Urology

## 2019-11-30 DIAGNOSIS — N200 Calculus of kidney: Secondary | ICD-10-CM | POA: Diagnosis present

## 2019-12-09 ENCOUNTER — Ambulatory Visit (INDEPENDENT_AMBULATORY_CARE_PROVIDER_SITE_OTHER): Payer: Medicare Other | Admitting: Urology

## 2019-12-09 DIAGNOSIS — N202 Calculus of kidney with calculus of ureter: Secondary | ICD-10-CM | POA: Diagnosis not present

## 2019-12-09 DIAGNOSIS — N401 Enlarged prostate with lower urinary tract symptoms: Secondary | ICD-10-CM

## 2020-03-22 ENCOUNTER — Other Ambulatory Visit: Payer: Self-pay

## 2020-03-22 ENCOUNTER — Inpatient Hospital Stay (HOSPITAL_COMMUNITY): Payer: Medicare Other

## 2020-03-22 ENCOUNTER — Inpatient Hospital Stay (HOSPITAL_COMMUNITY)
Admission: EM | Admit: 2020-03-22 | Discharge: 2020-03-25 | DRG: 064 | Disposition: A | Discharge status: Rehabilitation Facility | Payer: Medicare Other | Attending: Internal Medicine | Admitting: Internal Medicine

## 2020-03-22 ENCOUNTER — Encounter (HOSPITAL_COMMUNITY): Payer: Self-pay

## 2020-03-22 ENCOUNTER — Emergency Department (HOSPITAL_COMMUNITY): Payer: Medicare Other

## 2020-03-22 DIAGNOSIS — I6322 Cerebral infarction due to unspecified occlusion or stenosis of basilar arteries: Secondary | ICD-10-CM | POA: Diagnosis not present

## 2020-03-22 DIAGNOSIS — Z79899 Other long term (current) drug therapy: Secondary | ICD-10-CM | POA: Diagnosis not present

## 2020-03-22 DIAGNOSIS — Z8249 Family history of ischemic heart disease and other diseases of the circulatory system: Secondary | ICD-10-CM | POA: Diagnosis not present

## 2020-03-22 DIAGNOSIS — Z8711 Personal history of peptic ulcer disease: Secondary | ICD-10-CM

## 2020-03-22 DIAGNOSIS — I129 Hypertensive chronic kidney disease with stage 1 through stage 4 chronic kidney disease, or unspecified chronic kidney disease: Secondary | ICD-10-CM | POA: Diagnosis present

## 2020-03-22 DIAGNOSIS — E119 Type 2 diabetes mellitus without complications: Secondary | ICD-10-CM | POA: Diagnosis not present

## 2020-03-22 DIAGNOSIS — Z882 Allergy status to sulfonamides status: Secondary | ICD-10-CM | POA: Diagnosis not present

## 2020-03-22 DIAGNOSIS — Z87442 Personal history of urinary calculi: Secondary | ICD-10-CM

## 2020-03-22 DIAGNOSIS — I639 Cerebral infarction, unspecified: Secondary | ICD-10-CM

## 2020-03-22 DIAGNOSIS — E1122 Type 2 diabetes mellitus with diabetic chronic kidney disease: Secondary | ICD-10-CM | POA: Diagnosis present

## 2020-03-22 DIAGNOSIS — Z20822 Contact with and (suspected) exposure to covid-19: Secondary | ICD-10-CM | POA: Diagnosis present

## 2020-03-22 DIAGNOSIS — N183 Chronic kidney disease, stage 3 unspecified: Secondary | ICD-10-CM

## 2020-03-22 DIAGNOSIS — I6523 Occlusion and stenosis of bilateral carotid arteries: Secondary | ICD-10-CM | POA: Diagnosis present

## 2020-03-22 DIAGNOSIS — N400 Enlarged prostate without lower urinary tract symptoms: Secondary | ICD-10-CM

## 2020-03-22 DIAGNOSIS — R29703 NIHSS score 3: Secondary | ICD-10-CM | POA: Diagnosis present

## 2020-03-22 DIAGNOSIS — N179 Acute kidney failure, unspecified: Secondary | ICD-10-CM | POA: Diagnosis present

## 2020-03-22 DIAGNOSIS — Z87891 Personal history of nicotine dependence: Secondary | ICD-10-CM | POA: Diagnosis not present

## 2020-03-22 DIAGNOSIS — G8194 Hemiplegia, unspecified affecting left nondominant side: Secondary | ICD-10-CM | POA: Diagnosis present

## 2020-03-22 DIAGNOSIS — Z8679 Personal history of other diseases of the circulatory system: Secondary | ICD-10-CM | POA: Diagnosis not present

## 2020-03-22 DIAGNOSIS — I69354 Hemiplegia and hemiparesis following cerebral infarction affecting left non-dominant side: Secondary | ICD-10-CM | POA: Diagnosis present

## 2020-03-22 DIAGNOSIS — I351 Nonrheumatic aortic (valve) insufficiency: Secondary | ICD-10-CM | POA: Diagnosis not present

## 2020-03-22 DIAGNOSIS — Z9181 History of falling: Secondary | ICD-10-CM | POA: Diagnosis not present

## 2020-03-22 DIAGNOSIS — H5712 Ocular pain, left eye: Secondary | ICD-10-CM | POA: Diagnosis present

## 2020-03-22 DIAGNOSIS — Z7984 Long term (current) use of oral hypoglycemic drugs: Secondary | ICD-10-CM | POA: Diagnosis not present

## 2020-03-22 DIAGNOSIS — K219 Gastro-esophageal reflux disease without esophagitis: Secondary | ICD-10-CM | POA: Diagnosis present

## 2020-03-22 DIAGNOSIS — I6381 Other cerebral infarction due to occlusion or stenosis of small artery: Secondary | ICD-10-CM | POA: Diagnosis present

## 2020-03-22 DIAGNOSIS — G9341 Metabolic encephalopathy: Secondary | ICD-10-CM | POA: Diagnosis present

## 2020-03-22 DIAGNOSIS — I361 Nonrheumatic tricuspid (valve) insufficiency: Secondary | ICD-10-CM | POA: Diagnosis not present

## 2020-03-22 DIAGNOSIS — I63211 Cerebral infarction due to unspecified occlusion or stenosis of right vertebral arteries: Secondary | ICD-10-CM | POA: Diagnosis not present

## 2020-03-22 DIAGNOSIS — I10 Essential (primary) hypertension: Secondary | ICD-10-CM | POA: Diagnosis not present

## 2020-03-22 DIAGNOSIS — N1832 Chronic kidney disease, stage 3b: Secondary | ICD-10-CM | POA: Diagnosis present

## 2020-03-22 DIAGNOSIS — H5710 Ocular pain, unspecified eye: Secondary | ICD-10-CM | POA: Diagnosis not present

## 2020-03-22 DIAGNOSIS — E785 Hyperlipidemia, unspecified: Secondary | ICD-10-CM | POA: Diagnosis present

## 2020-03-22 DIAGNOSIS — K5901 Slow transit constipation: Secondary | ICD-10-CM | POA: Diagnosis not present

## 2020-03-22 DIAGNOSIS — E1121 Type 2 diabetes mellitus with diabetic nephropathy: Secondary | ICD-10-CM

## 2020-03-22 DIAGNOSIS — E1165 Type 2 diabetes mellitus with hyperglycemia: Secondary | ICD-10-CM | POA: Diagnosis present

## 2020-03-22 DIAGNOSIS — H9190 Unspecified hearing loss, unspecified ear: Secondary | ICD-10-CM | POA: Diagnosis present

## 2020-03-22 LAB — COMPREHENSIVE METABOLIC PANEL
ALT: 22 U/L (ref 0–44)
AST: 30 U/L (ref 15–41)
Albumin: 4.1 g/dL (ref 3.5–5.0)
Alkaline Phosphatase: 128 U/L — ABNORMAL HIGH (ref 38–126)
Anion gap: 9 (ref 5–15)
BUN: 29 mg/dL — ABNORMAL HIGH (ref 8–23)
CO2: 27 mmol/L (ref 22–32)
Calcium: 9.3 mg/dL (ref 8.9–10.3)
Chloride: 103 mmol/L (ref 98–111)
Creatinine, Ser: 1.49 mg/dL — ABNORMAL HIGH (ref 0.61–1.24)
GFR calc Af Amer: 50 mL/min — ABNORMAL LOW (ref >60)
GFR calc non Af Amer: 43 mL/min — ABNORMAL LOW (ref >60)
Glucose, Bld: 135 mg/dL — ABNORMAL HIGH (ref 70–99)
Potassium: 4.2 mmol/L (ref 3.5–5.1)
Sodium: 139 mmol/L (ref 135–145)
Total Bilirubin: 0.8 mg/dL (ref 0.3–1.2)
Total Protein: 6.8 g/dL (ref 6.5–8.1)

## 2020-03-22 LAB — DIFFERENTIAL
Abs Immature Granulocytes: 0.02 10*3/uL (ref 0.00–0.07)
Basophils Absolute: 0 10*3/uL (ref 0.0–0.1)
Basophils Relative: 0 %
Eosinophils Absolute: 0.1 10*3/uL (ref 0.0–0.5)
Eosinophils Relative: 1 %
Immature Granulocytes: 0 %
Lymphocytes Relative: 16 %
Lymphs Abs: 1.1 10*3/uL (ref 0.7–4.0)
Monocytes Absolute: 0.9 10*3/uL (ref 0.1–1.0)
Monocytes Relative: 12 %
Neutro Abs: 5.1 10*3/uL (ref 1.7–7.7)
Neutrophils Relative %: 71 %

## 2020-03-22 LAB — URINALYSIS, ROUTINE W REFLEX MICROSCOPIC
Bacteria, UA: NONE SEEN
Bilirubin Urine: NEGATIVE
Glucose, UA: 150 mg/dL — AB
Hgb urine dipstick: NEGATIVE
Ketones, ur: NEGATIVE mg/dL
Leukocytes,Ua: NEGATIVE
Nitrite: NEGATIVE
Protein, ur: 30 mg/dL — AB
Specific Gravity, Urine: 1.016 (ref 1.005–1.030)
pH: 7 (ref 5.0–8.0)

## 2020-03-22 LAB — I-STAT CHEM 8, ED
BUN: 27 mg/dL — ABNORMAL HIGH (ref 8–23)
Calcium, Ion: 1.2 mmol/L (ref 1.15–1.40)
Chloride: 102 mmol/L (ref 98–111)
Creatinine, Ser: 1.6 mg/dL — ABNORMAL HIGH (ref 0.61–1.24)
Glucose, Bld: 132 mg/dL — ABNORMAL HIGH (ref 70–99)
HCT: 40 % (ref 39.0–52.0)
Hemoglobin: 13.6 g/dL (ref 13.0–17.0)
Potassium: 4.4 mmol/L (ref 3.5–5.1)
Sodium: 139 mmol/L (ref 135–145)
TCO2: 27 mmol/L (ref 22–32)

## 2020-03-22 LAB — CBC
HCT: 40.2 % (ref 39.0–52.0)
Hemoglobin: 13.1 g/dL (ref 13.0–17.0)
MCH: 32.4 pg (ref 26.0–34.0)
MCHC: 32.6 g/dL (ref 30.0–36.0)
MCV: 99.5 fL (ref 80.0–100.0)
Platelets: 162 10*3/uL (ref 150–400)
RBC: 4.04 MIL/uL — ABNORMAL LOW (ref 4.22–5.81)
RDW: 12.8 % (ref 11.5–15.5)
WBC: 7.2 10*3/uL (ref 4.0–10.5)
nRBC: 0 % (ref 0.0–0.2)

## 2020-03-22 LAB — SARS CORONAVIRUS 2 (TAT 6-24 HRS): SARS Coronavirus 2: NEGATIVE

## 2020-03-22 LAB — ETHANOL: Alcohol, Ethyl (B): <10 mg/dL (ref <10)

## 2020-03-22 LAB — PROTIME-INR
INR: 1 (ref 0.8–1.2)
Prothrombin Time: 13.1 seconds (ref 11.4–15.2)

## 2020-03-22 LAB — RAPID URINE DRUG SCREEN, HOSP PERFORMED
Amphetamines: NOT DETECTED
Barbiturates: NOT DETECTED
Benzodiazepines: NOT DETECTED
Cocaine: NOT DETECTED
Opiates: NOT DETECTED
Tetrahydrocannabinol: NOT DETECTED

## 2020-03-22 LAB — GLUCOSE, CAPILLARY: Glucose-Capillary: 133 mg/dL — ABNORMAL HIGH (ref 70–99)

## 2020-03-22 LAB — APTT: aPTT: 28 seconds (ref 24–36)

## 2020-03-22 LAB — VITAMIN B12: Vitamin B-12: 941 pg/mL — ABNORMAL HIGH (ref 180–914)

## 2020-03-22 LAB — TSH: TSH: 0.915 u[IU]/mL (ref 0.350–4.500)

## 2020-03-22 MED ORDER — ASPIRIN 300 MG RE SUPP: 300.0000 mg | Freq: Every day | RECTAL | Status: DC

## 2020-03-22 MED ORDER — INSULIN ASPART 100 UNIT/ML ~~LOC~~ SOLN
0.0000 [IU] | Freq: Three times a day (TID) | SUBCUTANEOUS | Status: DC
  Administered 2020-03-23: 3 [IU] via SUBCUTANEOUS
  Administered 2020-03-23: 1 [IU] via SUBCUTANEOUS
  Administered 2020-03-24: 12:00:00 2 [IU] via SUBCUTANEOUS
  Administered 2020-03-24 (×2): 1 [IU] via SUBCUTANEOUS

## 2020-03-22 MED ORDER — SODIUM CHLORIDE 0.9 % IV SOLN: INTRAVENOUS | Status: DC

## 2020-03-22 MED ORDER — RAMIPRIL 1.25 MG PO CAPS
2.5000 mg | ORAL_CAPSULE | Freq: Every day | ORAL | Status: DC
  Filled 2020-03-22 (×3): qty 2

## 2020-03-22 MED ORDER — HEPARIN SODIUM (PORCINE) 5000 UNIT/ML IJ SOLN
5000.0000 [IU] | Freq: Three times a day (TID) | INTRAMUSCULAR | Status: DC
  Administered 2020-03-22 – 2020-03-25 (×9): 5000 [IU] via SUBCUTANEOUS
  Filled 2020-03-22 (×10): qty 1

## 2020-03-22 MED ORDER — PANTOPRAZOLE SODIUM 40 MG PO TBEC
40.0000 mg | DELAYED_RELEASE_TABLET | Freq: Every day | ORAL | Status: DC
  Administered 2020-03-22 – 2020-03-25 (×4): 40 mg via ORAL
  Filled 2020-03-22 (×4): qty 1

## 2020-03-22 MED ORDER — ACETAMINOPHEN 160 MG/5ML PO SOLN: 650.0000 mg | ORAL | Status: DC | PRN

## 2020-03-22 MED ORDER — ACETAMINOPHEN 325 MG PO TABS: 650.0000 mg | ORAL_TABLET | ORAL | Status: DC | PRN

## 2020-03-22 MED ORDER — ACETAMINOPHEN 650 MG RE SUPP: 650.0000 mg | RECTAL | Status: DC | PRN

## 2020-03-22 MED ORDER — ASPIRIN 325 MG PO TABS
325.0000 mg | ORAL_TABLET | Freq: Every day | ORAL | Status: DC
  Administered 2020-03-22 – 2020-03-25 (×4): 325 mg via ORAL
  Filled 2020-03-22 (×4): qty 1

## 2020-03-22 MED ORDER — INSULIN ASPART 100 UNIT/ML ~~LOC~~ SOLN
0.0000 [IU] | Freq: Every day | SUBCUTANEOUS | Status: DC
  Administered 2020-03-24: 21:00:00 2 [IU] via SUBCUTANEOUS

## 2020-03-22 MED ORDER — STROKE: EARLY STAGES OF RECOVERY BOOK
Freq: Once | Status: AC
  Filled 2020-03-22: qty 1

## 2020-03-22 MED ORDER — TAMSULOSIN HCL 0.4 MG PO CAPS
0.4000 mg | ORAL_CAPSULE | Freq: Every day | ORAL | Status: DC
  Administered 2020-03-22 – 2020-03-25 (×4): 0.4 mg via ORAL
  Filled 2020-03-22 (×4): qty 1

## 2020-03-22 MED ORDER — RAMIPRIL 2.5 MG PO CAPS
2.5000 mg | ORAL_CAPSULE | Freq: Every day | ORAL | Status: DC
  Filled 2020-03-22 (×2): qty 1

## 2020-03-22 MED ORDER — SENNOSIDES-DOCUSATE SODIUM 8.6-50 MG PO TABS
1.0000 | ORAL_TABLET | Freq: Every evening | ORAL | Status: DC | PRN
  Filled 2020-03-22: qty 1

## 2020-03-22 NOTE — H&P (Signed)
History and Physical    Martin Frazier. BSJ:[id number] DOB: October 25, 1934 DOA: 03/22/2020  Referring MD/NP/PA: Dr. Charm Barges PCP: Suzan Slick, MD  Patient coming from: Home  Chief Complaint: Poor balance, left-sided weakness and foggy mentation.  HPI: Martin Frazier. is a 86 y.o. male with a past medical history significant for gastroesophageal reflux disease, type 2 diabetes mellitus, hypertension, chronic kidney disease a stage IIIb and BPH; who presented to the hospital after experiencing for the last 48-72 hours intermittent episode of confusion/foggy mental status along with left side weakness, numbness and off-balance sensation.  Family expressed that due to his of balance patient and then the following and landed on his left side on 03/21/2020.  There has not been any fever, cough, chest pain, nausea, vomiting, hematuria, melena, hematochezia, headaches, blurred vision or contact with anyone experiencing URI symptoms.  In the ED CT head was negative for acute intracranial normalities; MRI positive for acute right thalamic infarct.  Renal function stable and at baseline for him.  Urinalysis demonstrating negative nitrites and negative leukocyte esterase.  Stable hemoglobin and otherwise electrolytes.  [facility] has been contacted to new patient for further evaluation and management of acute stroke.  Of note, prior to admission he was no using any aspirin or other secondary preventive agent as he experienced about a year ago an episode of peptic ulcer disease (no clear details about severity provided; but per EGD report on 04/2019 no significant bleeding appreciated).  Past Medical/Surgical History: Past Medical History:  Diagnosis Date  . Collapsed lung    left  . Diabetes mellitus without complication (HCC)    over 10 yrs  . GERD (gastroesophageal reflux disease)   . Hypertension   . Kidney stones     Past Surgical History:  Procedure Laterality Date  . APPENDECTOMY    . BIOPSY   04/01/2019   Procedure: BIOPSY;  Surgeon: Malissa Hippo, MD;  Location: [facility];  Service: Endoscopy;;  gastric  . CYSTOSCOPY W/ URETERAL STENT PLACEMENT Right 04/03/2019   Procedure: CYSTOSCOPY WITH RETROGRADE PYELOGRAM/URETERAL STENT PLACEMENT;  Surgeon: Marcine Matar, MD;  Location: [facility];  Service: Urology;  Laterality: Right;  . ESOPHAGOGASTRODUODENOSCOPY N/A 04/01/2019   Procedure: ESOPHAGOGASTRODUODENOSCOPY (EGD);  Surgeon: Malissa Hippo, MD;  Location: [facility];  Service: Endoscopy;  Laterality: N/A;  10:55am  . EXTRACORPOREAL SHOCK WAVE LITHOTRIPSY Right 04/16/2019   Procedure: EXTRACORPOREAL SHOCK WAVE LITHOTRIPSY (ESWL);  Surgeon: Jerilee Field, MD;  Location: [facility];  Service: Urology;  Laterality: Right;  . HEMORRHOID SURGERY    . KIDNEY STONE SURGERY    . TONSILLECTOMY      Social History:  reports that he has quit smoking. His smoking use included cigarettes. He has never used smokeless tobacco. He reports that he does not drink alcohol or use drugs.  Allergies: Allergies  Allergen Reactions  . Sulfa Antibiotics Nausea And Vomiting    Family History:  Per family at bedside family history significant just for hypertension.   Prior to Admission medications   Medication Sig Start Date End Date Taking? Authorizing Provider  Cholecalciferol (VITAMIN D) 2000 units CAPS Take 1 capsule by mouth daily.   Yes [provider]  Ginkgo Biloba (GINKOBA PO) Take 1 tablet by mouth daily.    Yes [provider]  Lactobacillus (ACIDOPHILUS PO) Take 1 capsule by mouth daily.   Yes [provider]  Magnesium 250 MG TABS Take 1 tablet by mouth daily.   Yes [provider]  metFORMIN (GLUCOPHAGE) 500 MG tablet Take 500 mg by mouth daily with breakfast.    Yes [provider]  omeprazole (PRILOSEC) 40 MG capsule Take 40 mg by mouth daily.   Yes [provider]  ramipril (ALTACE) 2.5 MG capsule Take 2.5 mg by mouth  daily.   Yes [provider]  Saw Palmetto, Serenoa repens, (SAW PALMETTO PO) Take 1 capsule by mouth daily.   Yes [provider]  tamsulosin (FLOMAX) 0.4 MG CAPS capsule Take 0.4 mg by mouth daily.    Yes [provider]  vitamin B-12 (CYANOCOBALAMIN) 1000 MCG tablet Take 1,000 mcg by mouth daily.    Yes [provider]  vitamin E 400 UNIT capsule Take 400 Units by mouth daily.   Yes [provider]    Review of Systems:  Negative except as otherwise mentioned in HPI.   Physical Exam: Vitals:   03/22/20 1151 03/22/20 1152 03/22/20 1248 03/22/20 1250  BP:   133/69   Pulse: 76 81 65 78  Resp: 17 19 17  (!) 26  Temp:      TempSrc:      SpO2: 98% 98% 98% 100%  Weight:      Height:       Constitutional: Afebrile, no chest pain, no shortness of breath, no fever.  Able to follow simple commands and no acute distress appreciated. Eyes: PERRL, lids and conjunctivae normal, no icterus, no nystagmus. ENMT: Mucous membranes are moist. Posterior pharynx clear of any exudate or lesions.Normal dentition.  Neck: normal, supple, no masses, no thyromegaly, no JVD. Respiratory: clear to auscultation bilaterally, no wheezing, no crackles. Normal respiratory effort. No accessory muscle use.  Cardiovascular: Regular rate and rhythm, no rubs, no gallops, no JVD. Abdomen: no tenderness, no masses palpated. No hepatosplenomegaly. Bowel sounds positive.  Musculoskeletal: no clubbing / cyanosis. No joint deformity upper and lower extremities. Good ROM, no contractures. Normal muscle tone.  Skin: no rashes, lesions, ulcers. No induration Neurologic: CN 2-12 grossly intact. Sensation intact, demonstrating 4 out of 5 muscle strength deficit on his left side and also expressing some numbness. Psychiatric: Currently with impaired insight secondary to confusion; alert and oriented x 2. Normal mood.    Labs on Admission: I have personally reviewed the following labs  and imaging studies  CBC: Recent Labs  Lab 03/22/20 1028 03/22/20 1031  WBC  --  7.2  NEUTROABS  --  5.1  HGB 13.6 13.1  HCT 40.0 40.2  MCV  --  99.5  PLT  --  162   Basic Metabolic Panel: Recent Labs  Lab 03/22/20 1028 03/22/20 1031  NA 139 139  K 4.4 4.2  CL 102 103  CO2  --  27  GLUCOSE 132* 135*  BUN 27* 29*  CREATININE 1.60* 1.49*  CALCIUM  --  9.3   GFR: Estimated Creatinine Clearance: 31.3 mL/min (A) (by C-G formula based on SCr of 1.49 mg/dL (H)).   Liver Function Tests: Recent Labs  Lab 03/22/20 1031  AST 30  ALT 22  ALKPHOS 128*  BILITOT 0.8  PROT 6.8  ALBUMIN 4.1   Coagulation Profile: Recent Labs  Lab 03/22/20 1031  INR 1.0   Urine analysis:    Component Value Date/Time   COLORURINE YELLOW 03/22/2020 1138   APPEARANCEUR CLEAR 03/22/2020 1138   LABSPEC 1.016 03/22/2020 1138   PHURINE 7.0 03/22/2020 1138   GLUCOSEU 150 (A) 03/22/2020 1138   HGBUR NEGATIVE 03/22/2020 1138   BILIRUBINUR NEGATIVE  03/22/2020 1138   KETONESUR NEGATIVE 03/22/2020 1138   PROTEINUR 30 (A) 03/22/2020 1138   NITRITE NEGATIVE 03/22/2020 1138   LEUKOCYTESUR NEGATIVE 03/22/2020 1138    Radiological Exams on Admission: CT HEAD WO CONTRAST  Result Date: 03/22/2020 CLINICAL DATA:  Confusion EXAM: CT HEAD WITHOUT CONTRAST TECHNIQUE: Contiguous axial images were obtained from the base of the skull through the vertex without intravenous contrast. COMPARISON:  None. FINDINGS: Brain: No evidence of acute infarction, hemorrhage, hydrocephalus, extra-axial collection or mass lesion/mass effect. Focal encephalomalacia from lacunar infarct in the right thalamus. Additional small lacunar infarcts in the left corona radiata. Mild-moderate low-density changes within the periventricular and subcortical white matter compatible with chronic microvascular ischemic change. Mild diffuse cerebral volume loss. Vascular: Atherosclerotic calcifications involving the large vessels of the skull  base. No unexpected hyperdense vessel. Skull: Normal. Negative for fracture or focal lesion. Sinuses/Orbits: No acute finding. Other: None. IMPRESSION: 1. No acute intracranial findings. 2. Chronic microvascular ischemic changes and cerebral volume loss. 3. Focal encephalomalacia from remote lacunar infarct in the right thalamus. Additional small remote lacunar infarcts in the left corona radiata. Electronically Signed   By: Duanne Guess D.O.   On: 03/22/2020 11:07   MR ANGIO HEAD WO CONTRAST  Result Date: 03/22/2020 CLINICAL DATA:  Confusion EXAM: MRI HEAD WITHOUT CONTRAST MRA HEAD WITHOUT CONTRAST TECHNIQUE: Multiplanar, multiecho pulse sequences of the brain and surrounding structures were obtained without intravenous contrast. Angiographic images of the head were obtained using MRA technique without contrast. COMPARISON:  None. FINDINGS: MRI HEAD FINDINGS Brain: There is a 1.2 cm area of restricted diffusion within the lateral right thalamus. No evidence of intracranial hemorrhage. Patchy and confluent areas of T2 hyperintensity in the supratentorial white matter nonspecific but probably reflect mild to moderate chronic microvascular ischemic changes. There are chronic small vessel infarcts of the left frontal white matter, left caudate, and left cerebellum. Prominence of the ventricles and sulci reflects mild generalized parenchymal volume loss. There is no intracranial mass, mass effect, or edema. There is no hydrocephalus or extra-axial fluid collection. Vascular: Major vessel flow voids at the skull base are preserved. Skull and upper cervical spine: Normal marrow signal is preserved. Sinuses/Orbits: Minor mucosal thickening.  Orbits are unremarkable. Other: Sella is unremarkable.  Mastoid air cells are clear. MRA HEAD FINDINGS Intracranial internal carotid arteries are patent. There is moderate to marked stenosis of the right paraclinoid ICA. Middle and anterior cerebral arteries are patent. 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 Intracranial vertebral arteries, basilar artery, posterior cerebral arteries are patent. Right posterior communicating artery is present with fetal origin of the right PCA. There is no significant stenosis or aneurysm. IMPRESSION: Acute right thalamic infarct. Chronic microvascular ischemic changes and chronic small vessel infarcts as described. 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 Electronically Signed   By: Guadlupe Spanish M.D.   On: 03/22/2020 13:02   MR BRAIN WO CONTRAST  Result Date: 03/22/2020 CLINICAL DATA:  Confusion EXAM: MRI HEAD WITHOUT CONTRAST MRA HEAD WITHOUT CONTRAST TECHNIQUE: Multiplanar, multiecho pulse sequences of the brain and surrounding structures were obtained without intravenous contrast. Angiographic images of the head were obtained using MRA technique without contrast. COMPARISON:  None. FINDINGS: MRI HEAD FINDINGS Brain: There is a 1.2 cm area of restricted diffusion within the lateral right thalamus. No evidence of intracranial hemorrhage. Patchy and confluent areas of T2 hyperintensity in the supratentorial white matter nonspecific but probably reflect mild to moderate chronic microvascular ischemic changes.  There are chronic small vessel infarcts of the left frontal white matter, left caudate, and left cerebellum. Prominence of the ventricles and sulci reflects mild generalized parenchymal volume loss. There is no intracranial mass, mass effect, or edema. There is no hydrocephalus or extra-axial fluid collection. Vascular: Major vessel flow voids at the skull base are preserved. Skull and upper cervical spine: Normal marrow signal is preserved. Sinuses/Orbits: Minor mucosal thickening.  Orbits are unremarkable. Other: Sella is unremarkable.  Mastoid air cells are clear. MRA HEAD FINDINGS Intracranial internal carotid arteries are patent. There is moderate to marked stenosis of the right paraclinoid ICA. Middle and  anterior cerebral arteries are patent. 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 Intracranial vertebral arteries, basilar artery, posterior cerebral arteries are patent. Right posterior communicating artery is present with fetal origin of the right PCA. There is no significant stenosis or aneurysm. IMPRESSION: Acute right thalamic infarct. Chronic microvascular ischemic changes and chronic small vessel infarcts as described. 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 Electronically Signed   By: Guadlupe Spanish M.D.   On: 03/22/2020 13:02   DG Chest Port 1 View  Result Date: 03/22/2020 CLINICAL DATA:  86 year old male with history of trauma from a fall yesterday complaining of left anterior chest pain. EXAM: PORTABLE CHEST 1 VIEW COMPARISON:  Chest x-ray 01/30/2019. FINDINGS: Lung volumes are normal. No consolidative airspace disease. No pleural effusions. No pneumothorax. No pulmonary nodule or mass noted. Pulmonary vasculature and the cardiomediastinal silhouette are within normal limits. Atherosclerosis in the thoracic aorta. No definite acute displaced left-sided rib fractures are noted. IMPRESSION: 1. No acute displaced left-sided rib fractures and no radiographic evidence of acute cardiopulmonary disease. 2. Aortic atherosclerosis. Electronically Signed   By: Trudie Reed M.D.   On: 03/22/2020 10:57    EKG: Independently reviewed.  No acute ischemic changes appreciated; normal axis and sinus rhythm.  Assessment/Plan 1-Acute ischemic VBA thalamic stroke, right (HCC) -Risk factors include hypertension, age and diabetes. -Patient was not using any agent for secondary prevention prior to admission; he said that about a year ago aspirin was discontinued secondary to peptic ulcer disease. -No signs of active bleeding currently -No acute intracranial normalities appreciated on CT scan but positive findings for acute lacunar ischemic infarct in his thalamic area on  MRI. -Will complete a stroke work-up including 2D echo, carotid Dopplers, TSH, B12, lipid panel and A1c. -Neurology service has been consulted and will follow further recommendations -Follow PT, speech and OT evaluation/recommendations -Aspirin for secondary prevention has been ordered at this time. -Monitor on telemetry.  2-essential hypertension -Overall stable -Continue home antihypertensive agents while allowing for permissive hypertension.  3-type 2 diabetes mellitus with nephropathy -Will check A1c -While inpatient will hold oral hypoglycemic agents -Sliding scale insulin has been ordered.  4-reflux disease -Continue PPI  5-BPH -Flomax will be continue  6-chronic kidney disease stage IIIb -In the setting of hypertension and diabetes -Creatinine appears to be at baseline -Continue to follow renal function trend.  7-acute metabolic encephalopathy -Appears to be secondary to problem #1 -Will also check TSH and B12 -Constant reorientation -Follow clinical response.  DVT prophylaxis: Heparin. Code Status: Full code. Family Communication: Wife was updated at bedside. Disposition Plan: To be determined; will complete a stroke work-up and follow physical therapy/Occupational Therapy recommendations for discharge plans. Consults called: Neurology service. Admission status: Inpatient, telemetry bed, length of stay more than 2 midnights.   Time Spent: 65 minutes  Vassie Loll MD [facility] Pager  [phone]   03/22/2020, 1:49 PM

## 2020-03-22 NOTE — Progress Notes (Signed)
Alert and oriented x 3, did not know date.  States left hand still tingling but can feel pin prick.  Unable to raise arms well for assessment due to previous  Rotator cuff problems.  Swallowed pills and ate supper with no difficutly

## 2020-03-22 NOTE — ED Provider Notes (Signed)
[facility] EMERGENCY DEPARTMENT Provider Note   CSN: [id number] Arrival date & time: 03/22/20  7209     History Chief Complaint  Patient presents with  . Altered Mental Status    Martin Frazier. is a 86 y.o. male.  Martin Frazier. Is an 86 y.o. who  has a past medical history of Collapsed lung, Diabetes mellitus without complication (HCC), GERD (gastroesophageal reflux disease), Hypertension, and Kidney stones. He presents to the Ed with a cc of numbness and weakness. He reports numbness and paresthesia in the left thumb that started around 8:00 am yesterday and  progressed to include the whole hand. He has associated numbness in the left lower extremity and feels that the are arm and leg are weak. He had at least one fall last early this morning around 2:30 am that he felt was due to floor wax. He c/o pain to the left abdomen and left rib cage. He denies hitting his head or LOC. His wife feels that he has been "a little foggy headed over the last few days." She claims he was like this before when he had a UTI.  The patient denies HA, visual changes, difficulty with speech or swallowing, or facial droop.  The history is provided by the patient and medical records. No language interpreter was used.  Extremity Weakness This is a new problem. The current episode started yesterday (around 8:00am). The problem occurs constantly. The problem has not changed since onset.Pertinent negatives include no chest pain, no abdominal pain, no headaches and no shortness of breath.       Past Medical History:  Diagnosis Date  . Collapsed lung    left  . Diabetes mellitus without complication (HCC)    over 10 yrs  . GERD (gastroesophageal reflux disease)   . Hypertension   . Kidney stones     Patient Active Problem List   Diagnosis Date Noted  . Abdominal pain, epigastric 03/26/2019  . Abnormal CT of the abdomen 03/26/2019  . Spinal stenosis of lumbar region with neurogenic claudication  05/21/2018  . Unspecified constipation 07/22/2014  . Diabetes (HCC) 07/22/2014  . Essential hypertension, benign 07/22/2014  . GERD (gastroesophageal reflux disease) 07/22/2014    Past Surgical History:  Procedure Laterality Date  . APPENDECTOMY    . BIOPSY  04/01/2019   Procedure: BIOPSY;  Surgeon: Malissa Hippo, MD;  Location: [facility];  Service: Endoscopy;;  gastric  . CYSTOSCOPY W/ URETERAL STENT PLACEMENT Right 04/03/2019   Procedure: CYSTOSCOPY WITH RETROGRADE PYELOGRAM/URETERAL STENT PLACEMENT;  Surgeon: Marcine Matar, MD;  Location: [facility] ORS;  Service: Urology;  Laterality: Right;  . ESOPHAGOGASTRODUODENOSCOPY N/A 04/01/2019   Procedure: ESOPHAGOGASTRODUODENOSCOPY (EGD);  Surgeon: Malissa Hippo, MD;  Location: [facility];  Service: Endoscopy;  Laterality: N/A;  10:55am  . EXTRACORPOREAL SHOCK WAVE LITHOTRIPSY Right 04/16/2019   Procedure: EXTRACORPOREAL SHOCK WAVE LITHOTRIPSY (ESWL);  Surgeon: Jerilee Field, MD;  Location: [facility] ORS;  Service: Urology;  Laterality: Right;  . HEMORRHOID SURGERY    . KIDNEY STONE SURGERY    . TONSILLECTOMY         No family history on file.  Social History   Tobacco Use  . Smoking status: Former Smoker    Types: Cigarettes  . Smokeless tobacco: Never Used  . Tobacco comment: quit 2006  Substance Use Topics  . Alcohol use: No  . Drug use: No    Home Medications Prior to Admission medications   Medication Sig Start  Date End Date Taking? Authorizing Provider  ASPIRIN LOW DOSE 81 MG EC tablet Take 81 mg by mouth daily. 11/11/19   [provider]  cephALEXin (KEFLEX) 500 MG capsule Take 1 capsule (500 mg total) by mouth 2 (two) times daily. 04/04/19   Marcine Matar, MD  Cholecalciferol (VITAMIN D) 2000 units CAPS Take 1 capsule by mouth daily.    [provider]  dexlansoprazole (DEXILANT) 60 MG capsule Take 60 mg by mouth daily.    [provider]  Ginkgo Biloba (GINKOBA PO) Take 1 tablet by  mouth daily.     [provider]  Lactobacillus (ACIDOPHILUS PO) Take 1 capsule by mouth daily.    [provider]  Magnesium 250 MG TABS Take 1 tablet by mouth daily.    [provider]  metFORMIN (GLUCOPHAGE) 500 MG tablet Take 500 mg by mouth daily with breakfast.     [provider]  oxyCODONE-acetaminophen (PERCOCET) 5-325 MG tablet Take 1-2 tablets by mouth every 6 (six) hours as needed for severe pain. 04/16/19 04/15/20  Jerilee Field, MD  phenazopyridine (PYRIDIUM) 200 MG tablet Take 1 tablet (200 mg total) by mouth 3 (three) times daily as needed (burning w/ urination). 04/04/19 04/03/20  Marcine Matar, MD  ramipril (ALTACE) 2.5 MG capsule Take 2.5 mg by mouth daily.    [provider]  Saw Palmetto, Serenoa repens, (SAW PALMETTO PO) Take 1 capsule by mouth daily.    [provider]  tamsulosin (FLOMAX) 0.4 MG CAPS capsule Take 0.4 mg by mouth daily.     [provider]  vitamin B-12 (CYANOCOBALAMIN) 1000 MCG tablet Take 1,000 mcg by mouth daily.     [provider]  vitamin E 400 UNIT capsule Take 400 Units by mouth daily.    [provider]    Allergies    Sulfa antibiotics  Review of Systems   Review of Systems  Constitutional: Negative.   HENT: Negative.  Negative for trouble swallowing.   Eyes: Negative.   Respiratory: Negative.  Negative for shortness of breath.   Cardiovascular: Negative for chest pain.  Gastrointestinal: Negative.  Negative for abdominal pain.  Endocrine: Negative.   Genitourinary: Negative.   Musculoskeletal: Positive for extremity weakness.  Neurological: Positive for weakness and numbness. Negative for dizziness, tremors, seizures, syncope, facial asymmetry, speech difficulty, light-headedness and headaches.  Psychiatric/Behavioral: Positive for confusion.  All other systems reviewed and are negative.   Physical Exam Updated Vital Signs BP 127/70   Pulse 99    Resp 18   Ht 5\' 8"  (1.727 m)   Wt 59 kg   SpO2 98%   BMI 19.77 kg/m   Physical Exam Vitals and nursing note reviewed.  Constitutional:      General: He is not in acute distress.    Appearance: He is well-developed. He is not diaphoretic.  HENT:     Head: Normocephalic and atraumatic.  Eyes:     General: No scleral icterus.    Conjunctiva/sclera: Conjunctivae normal.  Cardiovascular:     Rate and Rhythm: Normal rate and regular rhythm.     Heart sounds: Normal heart sounds.  Pulmonary:     Effort: Pulmonary effort is normal. No respiratory distress.     Breath sounds: Normal breath sounds.  Abdominal:     Palpations: Abdomen is soft.     Tenderness: There is no abdominal tenderness.  Musculoskeletal:     Cervical back: Normal range of motion and neck supple.  Skin:  General: Skin is warm and dry.  Neurological:     Mental Status: He is alert.     GCS: GCS eye subscore is 4. GCS verbal subscore is 5. GCS motor subscore is 6.     Cranial Nerves: Cranial nerves are intact.     Sensory: Sensory deficit present.     Motor: Weakness present.     Deep Tendon Reflexes: Reflexes normal.     Comments: Decreased sensation to light touch in the left upper and lower extremity  Weakness in the left upper and lower extremities (R=5/5, L=4/5)  gait and coordination deferred. Negative romberg.  Psychiatric:        Behavior: Behavior normal.     ED Results / Procedures / Treatments   Labs (all labs ordered are listed, but only abnormal results are displayed) Labs Reviewed - No data to display  EKG EKG Interpretation  Date/Time:  Tuesday March 22 2020 10:12:25 EDT Ventricular Rate:  87 PR Interval:    QRS Duration: 94 QT Interval:  349 QTC Calculation: 420 R Axis:   42 Text Interpretation: Sinus rhythm Atrial premature complex No significant change since 1/20 Confirmed by Aletta Edouard [phone]) on 03/22/2020 10:25:02 AM   Radiology CT HEAD WO CONTRAST  Result Date:  03/22/2020 CLINICAL DATA:  Confusion EXAM: CT HEAD WITHOUT CONTRAST TECHNIQUE: Contiguous axial images were obtained from the base of the skull through the vertex without intravenous contrast. COMPARISON:  None. FINDINGS: Brain: No evidence of acute infarction, hemorrhage, hydrocephalus, extra-axial collection or mass lesion/mass effect. Focal encephalomalacia from lacunar infarct in the right thalamus. Additional small lacunar infarcts in the left corona radiata. Mild-moderate low-density changes within the periventricular and subcortical white matter compatible with chronic microvascular ischemic change. Mild diffuse cerebral volume loss. Vascular: Atherosclerotic calcifications involving the large vessels of the skull base. No unexpected hyperdense vessel. Skull: Normal. Negative for fracture or focal lesion. Sinuses/Orbits: No acute finding. Other: None. IMPRESSION: 1. No acute intracranial findings. 2. Chronic microvascular ischemic changes and cerebral volume loss. 3. Focal encephalomalacia from remote lacunar infarct in the right thalamus. Additional small remote lacunar infarcts in the left corona radiata. Electronically Signed   By: Davina Poke D.O.   On: 03/22/2020 11:07   MR ANGIO HEAD WO CONTRAST  Result Date: 03/22/2020 CLINICAL DATA:  Confusion EXAM: MRI HEAD WITHOUT CONTRAST MRA HEAD WITHOUT CONTRAST TECHNIQUE: Multiplanar, multiecho pulse sequences of the brain and surrounding structures were obtained without intravenous contrast. Angiographic images of the head were obtained using MRA technique without contrast. COMPARISON:  None. FINDINGS: MRI HEAD FINDINGS Brain: There is a 1.2 cm area of restricted diffusion within the lateral right thalamus. No evidence of intracranial hemorrhage. Patchy and confluent areas of T2 hyperintensity in the supratentorial white matter nonspecific but probably reflect mild to moderate chronic microvascular ischemic changes. There are chronic small vessel  infarcts of the left frontal white matter, left caudate, and left cerebellum. Prominence of the ventricles and sulci reflects mild generalized parenchymal volume loss. There is no intracranial mass, mass effect, or edema. There is no hydrocephalus or extra-axial fluid collection. Vascular: Major vessel flow voids at the skull base are preserved. Skull and upper cervical spine: Normal marrow signal is preserved. Sinuses/Orbits: Minor mucosal thickening.  Orbits are unremarkable. Other: Sella is unremarkable.  Mastoid air cells are clear. MRA HEAD FINDINGS Intracranial internal carotid arteries are patent. There is moderate to marked stenosis of the right paraclinoid ICA. Middle and anterior cerebral arteries are patent. 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 Intracranial vertebral arteries, basilar artery, posterior cerebral arteries are patent. Right posterior communicating artery is present with fetal origin of the right PCA. There is no significant stenosis or aneurysm. IMPRESSION: Acute right thalamic infarct. Chronic microvascular ischemic changes and chronic small vessel infarcts as described. 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 Electronically Signed   By: Guadlupe Spanish M.D.   On: 03/22/2020 13:02   MR BRAIN WO CONTRAST  Result Date: 03/22/2020 CLINICAL DATA:  Confusion EXAM: MRI HEAD WITHOUT CONTRAST MRA HEAD WITHOUT CONTRAST TECHNIQUE: Multiplanar, multiecho pulse sequences of the brain and surrounding structures were obtained without intravenous contrast. Angiographic images of the head were obtained using MRA technique without contrast. COMPARISON:  None. FINDINGS: MRI HEAD FINDINGS Brain: There is a 1.2 cm area of restricted diffusion within the lateral right thalamus. No evidence of intracranial hemorrhage. Patchy and confluent areas of T2 hyperintensity in the supratentorial white matter nonspecific but probably reflect mild to moderate chronic microvascular  ischemic changes. There are chronic small vessel infarcts of the left frontal white matter, left caudate, and left cerebellum. Prominence of the ventricles and sulci reflects mild generalized parenchymal volume loss. There is no intracranial mass, mass effect, or edema. There is no hydrocephalus or extra-axial fluid collection. Vascular: Major vessel flow voids at the skull base are preserved. Skull and upper cervical spine: Normal marrow signal is preserved. Sinuses/Orbits: Minor mucosal thickening.  Orbits are unremarkable. Other: Sella is unremarkable.  Mastoid air cells are clear. MRA HEAD FINDINGS Intracranial internal carotid arteries are patent. There is moderate to marked stenosis of the right paraclinoid ICA. Middle and anterior cerebral arteries are patent. 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 Intracranial vertebral arteries, basilar artery, posterior cerebral arteries are patent. Right posterior communicating artery is present with fetal origin of the right PCA. There is no significant stenosis or aneurysm. IMPRESSION: Acute right thalamic infarct. Chronic microvascular ischemic changes and chronic small vessel infarcts as described. 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 Electronically Signed   By: Guadlupe Spanish M.D.   On: 03/22/2020 13:02   DG Chest Port 1 View  Result Date: 03/22/2020 CLINICAL DATA:  86 year old male with history of trauma from a fall yesterday complaining of left anterior chest pain. EXAM: PORTABLE CHEST 1 VIEW COMPARISON:  Chest x-ray 01/30/2019. FINDINGS: Lung volumes are normal. No consolidative airspace disease. No pleural effusions. No pneumothorax. No pulmonary nodule or mass noted. Pulmonary vasculature and the cardiomediastinal silhouette are within normal limits. Atherosclerosis in the thoracic aorta. No definite acute displaced left-sided rib fractures are noted. IMPRESSION: 1. No acute displaced left-sided rib fractures and no  radiographic evidence of acute cardiopulmonary disease. 2. Aortic atherosclerosis. Electronically Signed   By: Trudie Reed M.D.   On: 03/22/2020 10:57    Procedures .Critical Care Performed by: Arthor Captain, PA-C Authorized by: Arthor Captain, PA-C   Critical care provider statement:    Critical care time (minutes):  40   Critical care time was exclusive of:  Separately billable procedures and treating other patients   Critical care was necessary to treat or prevent imminent or life-threatening deterioration of the following conditions:  CNS failure or compromise   Critical care was time spent personally by me on the following activities:  Discussions with consultants, evaluation of patient's response to treatment, examination of patient, ordering and performing treatments and interventions, ordering and review of laboratory studies, ordering and review of radiographic studies,  pulse oximetry, re-evaluation of patient's condition, obtaining history from patient or surrogate and review of old charts   (including critical care time)  Medications Ordered in ED Medications - No data to display  ED Course  I have reviewed the triage vital signs and the nursing notes.  Pertinent labs & imaging results that were available during my care of the patient were reviewed by me and considered in my medical decision making (see chart for details).  Clinical Course as of Mar 23 1031  Tue Mar 22, 2020  324 86 year old male brought in by his wife for evaluation of some increased confusion along with some recent falls possibly left-sided weakness and numbness.  Patient is awake and alert here.  Getting CT along with lab work   [MB]    Clinical Course User Index [MB] Terrilee Files, MD   MDM Rules/Calculators/A&P                      CC:UL weakness and paresthesia of left extremties VS: BP 133/69   Pulse 78   Temp 97.9 F (36.6 C) (Oral)   Resp (!) 26   Ht 5\' 8"  (1.727 m)   Wt 59  kg   SpO2 100%   BMI 19.77 kg/m  is gathered by patient and emr. DDX: differential includes stroke, MS, demylinating Labs: I reviewed the labs which show urine shows no evidence of infection.  Negative UDS CMP shows a mildly elevated blood glucose of insignificant value.  BUN and creatinine are elevated however this appears chronic.  Alkaline phosphatase is just above normal and of insignificant value.  Patient's PT/INR, APTT, ethanol and the differential are all within normal limits. CBC shows no leukocytosis or anemia. Imaging: I personally reviewed the images (CT head without contrast, MRI brain and MR angio brain, and portable 1 view chest x-ray) which show(s) CT shows some encephalomalacia in the appearance of old lacunar infarcts as well as the MRI MRA shows new infarct in the thalamus on my interpretation.  Chest x-ray without abnormality EKG: Sinus rhythm at a rate of 87 without evidence of arrhythmia or atrial fibrillation MDM: Patient here with hemiparesis and paresthesia.  His evaluation is consistent with acute infarct.  I have discussed all findings with the patient and his wife at bedside.  He has had no worsening in his condition here in the emergency department is maintaining his airway well.  Patient outside the window for TPA. Patient disposition: Admit Patient condition: . The patient appears reasonably stabilized for admission considering the current resources, flow, and capabilities available in the ED at this time, and I doubt any other [facility] requiring further screening and/or treatment in the ED prior to admission.  Final Clinical Impression(s) / ED Diagnoses Final diagnoses:  None    Rx / DC Orders ED Discharge Orders    None       [facility], PA-C 03/22/20 1641    03/24/20, MD 03/22/20 [phone]

## 2020-03-22 NOTE — ED Triage Notes (Signed)
Pt reports felt confused and "foggy" for the past few days.  Reports numbness and tingling in left hand and left buttock since yesterday  Pt reports feels like left side is weaker.  Pt says he fell yesterday afternoon and again around 0230 this morning.  Pt has abrasion and bruise to left lower abd.

## 2020-03-22 NOTE — ED Notes (Signed)
Off unit for CT.

## 2020-03-22 NOTE — ED Notes (Signed)
Family at bedside. 

## 2020-03-23 ENCOUNTER — Inpatient Hospital Stay (HOSPITAL_COMMUNITY): Payer: Medicare Other

## 2020-03-23 DIAGNOSIS — I639 Cerebral infarction, unspecified: Secondary | ICD-10-CM

## 2020-03-23 DIAGNOSIS — I361 Nonrheumatic tricuspid (valve) insufficiency: Secondary | ICD-10-CM

## 2020-03-23 DIAGNOSIS — I351 Nonrheumatic aortic (valve) insufficiency: Secondary | ICD-10-CM

## 2020-03-23 LAB — GLUCOSE, CAPILLARY
Glucose-Capillary: 110 mg/dL — ABNORMAL HIGH (ref 70–99)
Glucose-Capillary: 230 mg/dL — ABNORMAL HIGH (ref 70–99)
Glucose-Capillary: 144 mg/dL — ABNORMAL HIGH (ref 70–99)
Glucose-Capillary: 179 mg/dL — ABNORMAL HIGH (ref 70–99)

## 2020-03-23 LAB — AMMONIA: Ammonia: 20 umol/L (ref 9–35)

## 2020-03-23 LAB — BASIC METABOLIC PANEL
Anion gap: 9 (ref 5–15)
BUN: 26 mg/dL — ABNORMAL HIGH (ref 8–23)
CO2: 27 mmol/L (ref 22–32)
Calcium: 9 mg/dL (ref 8.9–10.3)
Chloride: 102 mmol/L (ref 98–111)
Creatinine, Ser: 1.65 mg/dL — ABNORMAL HIGH (ref 0.61–1.24)
GFR calc Af Amer: 44 mL/min — ABNORMAL LOW (ref >60)
GFR calc non Af Amer: 38 mL/min — ABNORMAL LOW (ref >60)
Glucose, Bld: 126 mg/dL — ABNORMAL HIGH (ref 70–99)
Potassium: 4.5 mmol/L (ref 3.5–5.1)
Sodium: 138 mmol/L (ref 135–145)

## 2020-03-23 LAB — LIPID PANEL
Cholesterol: 154 mg/dL (ref 0–200)
HDL: 46 mg/dL (ref >40)
LDL Cholesterol: 87 mg/dL (ref 0–99)
Total CHOL/HDL Ratio: 3.3 RATIO
Triglycerides: 104 mg/dL (ref <150)
VLDL: 21 mg/dL (ref 0–40)

## 2020-03-23 LAB — CBC
HCT: 40.7 % (ref 39.0–52.0)
Hemoglobin: 13.2 g/dL (ref 13.0–17.0)
MCH: 32.2 pg (ref 26.0–34.0)
MCHC: 32.4 g/dL (ref 30.0–36.0)
MCV: 99.3 fL (ref 80.0–100.0)
Platelets: 174 10*3/uL (ref 150–400)
RBC: 4.1 MIL/uL — ABNORMAL LOW (ref 4.22–5.81)
RDW: 12.6 % (ref 11.5–15.5)
WBC: 6.9 10*3/uL (ref 4.0–10.5)
nRBC: 0 % (ref 0.0–0.2)

## 2020-03-23 LAB — ECHOCARDIOGRAM COMPLETE
Height: 68 in
Weight: 2080 oz

## 2020-03-23 LAB — HEMOGLOBIN A1C
Hgb A1c MFr Bld: 7.1 % — ABNORMAL HIGH (ref 4.8–5.6)
Mean Plasma Glucose: 157.07 mg/dL

## 2020-03-23 LAB — FOLATE: Folate: 15.2 ng/mL (ref >5.9)

## 2020-03-23 MED ORDER — ATORVASTATIN CALCIUM 10 MG PO TABS
10.0000 mg | ORAL_TABLET | Freq: Every day | ORAL | Status: DC
  Administered 2020-03-23 – 2020-03-24 (×2): 10 mg via ORAL
  Filled 2020-03-23 (×3): qty 1

## 2020-03-23 NOTE — Evaluation (Signed)
Physical Therapy Evaluation Patient Details Name: Martin Frazier. MRN: [id number] DOB: May 06, 1934 Today's Date: 03/23/2020   History of Present Illness  Martin Frazier. is a 86 y.o. male with a past medical history significant for gastroesophageal reflux disease, type 2 diabetes mellitus, hypertension, chronic kidney disease a stage IIIb and BPH; who presented to the hospital after experiencing for the last 48-72 hours intermittent episode of confusion/foggy mental status along with left side weakness, numbness and off-balance sensation.  Family expressed that due to his of balance patient and then the following and landed on his left side on 03/21/2020.  There has not been any fever, cough, chest pain, nausea, vomiting, hematuria, melena, hematochezia, headaches, blurred vision or contact with anyone experiencing URI symptoms. In the ED CT head was negative for acute intracranial normalities; MRI positive for acute right thalamic infarct.    Clinical Impression  Patient unsteady on feet with difficulty advancing LLE due to weakness, poor tolerance for supporting self without AD or using single point cane when standing, required use of RW for safety, demonstrates slow labored cadence with mild difficulty advancing LLE due to weakness and decreased coordination, has to ambulate with wider base of support and limited for ambulation due to fatigue.  Patient tolerated sitting up in chair with his spouse present after therapy - RN aware.   Patient will benefit from continued physical therapy in hospital and recommended venue below to increase strength, balance, endurance for safe ADLs and gait.    Follow Up Recommendations CIR    Equipment Recommendations  None recommended by PT    Recommendations for Other Services       Precautions / Restrictions Precautions Precautions: Fall Restrictions Weight Bearing Restrictions: No      Mobility  Bed Mobility Overal bed mobility: Needs Assistance Bed  Mobility: Supine to Sit     Supine to sit: Min guard     General bed mobility comments: had to fall back into bed due to increasing left sided chest wall pain during supine to sitting  Transfers Overall transfer level: Needs assistance Equipment used: Rolling walker (2 wheeled);1 person hand held assist;None Transfers: Sit to/from [employer] Sit to Stand: Min guard Stand pivot transfers: Min guard       General transfer comment: unsteady on feet, labored movement, labored movement  Ambulation/Gait Ambulation/Gait assistance: Min assist Gait Distance (Feet): 45 Feet Assistive device: Rolling walker (2 wheeled) Gait Pattern/deviations: Decreased step length - left;Decreased stance time - left;Decreased stride length;Trunk flexed Gait velocity: decreased   General Gait Details: very unsteady with near loss of balance when attempting ambulation without AD or using SPC, required use of RW for safety demonstrating slow labored cadence with mild difficulty advancing LLE due to weakness, limited secondary to c/o fatigue  Stairs            Wheelchair Mobility    Modified Rankin (Stroke Patients Only)       Balance Overall balance assessment: Needs assistance Sitting-balance support: Feet supported;No upper extremity supported Sitting balance-Leahy Scale: Good Sitting balance - Comments: seated at EOB   Standing balance support: During functional activity;No upper extremity supported Standing balance-Leahy Scale: Poor Standing balance comment: poor without AD or using SPC, fair using RW                             Pertinent Vitals/Pain Pain Assessment: Faces Faces Pain Scale: Hurts little more Pain Location: left  side of chest wall due to fall at home Pain Descriptors / Indicators: Sore;Discomfort Pain Intervention(s): Limited activity within patient's tolerance;Monitored during session;Repositioned    Home Living Family/patient expects  to be discharged to:: Private residence Living Arrangements: Spouse/significant other Available Help at Discharge: Family;Available 24 hours/day Type of Home: House Home Access: Stairs to enter Entrance Stairs-Rails: None Entrance Stairs-Number of Steps: 2 Home Layout: One level Home Equipment: Walker - 2 wheels;Cane - single point;Grab bars - tub/shower      Prior Function Level of Independence: Independent         Comments: Tourist information centre manager, drives, chops wood, rides tractor, Training and development officer Dominance   Dominant Hand: Right    Extremity/Trunk Assessment   Upper Extremity Assessment Upper Extremity Assessment: Defer to OT evaluation RUE Deficits / Details: ROM<50% and decreased strength due to past RC injury RUE Sensation: WNL RUE Coordination: WNL LUE Deficits / Details: ROM WFL, strength 4/5 throughout. Decreased grip strength LUE Sensation: WNL(reports of numbness) LUE Coordination: WNL    Lower Extremity Assessment Lower Extremity Assessment: Generalized weakness;RLE deficits/detail;LLE deficits/detail RLE Deficits / Details: WNL RLE Sensation: WNL RLE Coordination: WNL LLE Deficits / Details: grossly -4/5 except ankle dorsiflexors, knee flexors, hip flexors  3+/5, LLE Sensation: decreased light touch LLE Coordination: decreased gross motor    Cervical / Trunk Assessment Cervical / Trunk Assessment: Kyphotic  Communication   Communication: HOH  Cognition Arousal/Alertness: Awake/alert Behavior During Therapy: WFL for tasks assessed/performed Overall Cognitive Status: Within Functional Limits for tasks assessed                                        General Comments      Exercises     Assessment/Plan    PT Assessment Patient needs continued PT services  PT Problem List Decreased strength;Decreased activity tolerance;Decreased balance;Decreased mobility       PT Treatment Interventions Gait training;DME instruction;Functional  mobility training;Stair training;Therapeutic activities;Therapeutic exercise;Neuromuscular re-education;Patient/family education    PT Goals (Current goals can be found in the Care Plan section)  Acute Rehab PT Goals Patient Stated Goal: to get stronger and go home PT Goal Formulation: With patient/family Time For Goal Achievement: 04/06/20 Potential to Achieve Goals: Good    Frequency 7X/week   Barriers to discharge        Co-evaluation               AM-PAC PT "6 Clicks" Mobility  Outcome Measure Help needed turning from your back to your side while in a flat bed without using bedrails?: None Help needed moving from lying on your back to sitting on the side of a flat bed without using bedrails?: A Little Help needed moving to and from a bed to a chair (including a wheelchair)?: A Little Help needed standing up from a chair using your arms (e.g., wheelchair or bedside chair)?: A Little Help needed to walk in hospital room?: A Lot Help needed climbing 3-5 steps with a railing? : A Lot 6 Click Score: 17    End of Session Equipment Utilized During Treatment: Gait belt Activity Tolerance: Patient tolerated treatment well;Patient limited by fatigue Patient left: in chair;with call bell/phone within reach;with family/visitor present Nurse Communication: Mobility status PT Visit Diagnosis: Unsteadiness on feet (R26.81);Other abnormalities of gait and mobility (R26.89);Muscle weakness (generalized) (M62.81)    Time: 5102-5852 PT Time Calculation (min) (ACUTE ONLY): 29 min  Charges:   PT Evaluation $PT Eval Moderate Complexity: 1 Mod PT Treatments $Therapeutic Activity: 23-37 mins        11:18 AM, 03/23/20 Lonell Grandchild, MPT Physical Therapist with [facility] [phone] office [phone] mobile phone

## 2020-03-23 NOTE — PMR Pre-admission (Signed)
PMR Admission Coordinator Pre-Admission Assessment  Patient: Martin Trickett. is an 86 y.o., male MRN: [id number] DOB: August 26, 1934 Height: 5\' 8"  (172.7 cm) Weight: 59 kg  Insurance Information HMO:    PPO:      PCP:      IPA:      80/20:      OTHER:  PRIMARY: Medicare a and b      Policy#:      Subscriber: pt Benefits:  Phone #: passport one online     Name: 3/24 Eff. Date: 02/28/2001     Deduct: $1484      Out of Pocket Max: none      Life Max: none CIR: 100%      SNF: 20 full days Outpatient: 80%     Co-Pay: 20% Home Health: 100%      Co-Pay: none DME: 80%     Co-Pay: 20% Providers: pt choice  SECONDARY: AARP supplement      Policy#: 04/30/2001        Medicaid Application Date:       Case Manager:  Disability Application Date:       Case Worker:   The "Data Collection Information Summary" for patients in Inpatient Rehabilitation Facilities with attached "Privacy Act Statement-Health Care Records" was provided and verbally reviewed with: Patient and Family  Emergency Contact Information Contact Information    Name Relation Home Work Mobile   Martin Frazier Spouse [phone]  [phone]   [name], Martin Frazier  [phone]      Current Medical History  Patient Admitting Diagnosis: CVA  History of Present Illness: 86 year old male with past medical history significant for GERD, Type 2 DM, HTN, chronic kidney disease stage IIIb and BPH. Presented 03/22/2020 after experiencing for the last 48 to 72 hrs intermittent episodes of confusion/foggy mental status along with left sided weakness, numbness and balance issues. 03/24/2020 03/21/2020. CT scan in ED negative, MRI with acute right thalamic infarct. Patient not using any aspirin or secondary preventive agents for he experience a year ago an episode of peptic ulcer disease.  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 Carotid duplex negative for significant stenosis. Holding ramipril to  restart at d/c to allow for permissive HTN. Holding metformin. Novolog sliding scale. Acute metabolic encephalopathy with spouse say near baseline.   Complete NIHSS TOTAL: 1  Patient's medical record from [facility] has been reviewed by the rehabilitation admission coordinator and physician.  Past Medical History  Past Medical History:  Diagnosis Date  . Collapsed lung    left  . Diabetes mellitus without complication (HCC)    over 10 yrs  . GERD (gastroesophageal reflux disease)   . Hypertension   . Kidney stones     Family History   family history is not on file.  Prior Rehab/Hospitalizations Has the patient had prior rehab or hospitalizations prior to admission? Yes  Has the patient had major surgery during 100 days prior to admission? no   Current Medications  Current Facility-Administered Medications:  .  acetaminophen (TYLENOL) tablet 650 mg, 650 mg, Oral, Q4H PRN **OR** acetaminophen (TYLENOL) 160 MG/5ML solution 650 mg, 650 mg, Per Tube, Q4H PRN **OR** acetaminophen (TYLENOL) suppository 650 mg, 650 mg, Rectal, Q4H PRN, [facility], MD .  aspirin suppository 300 mg, 300 mg, Rectal, Daily **OR** aspirin tablet 325 mg, 325 mg, Oral, Daily, Vassie Loll, MD, 325 mg at 03/25/20 0851 .  atorvastatin (LIPITOR) tablet 10 mg, 10  mg, Oral, q1800, Tat, David, MD, 10 mg at 03/24/20 1750 .  heparin injection 5,000 Units, 5,000 Units, Subcutaneous, Q8H, Vassie Loll, MD, 5,000 Units at 03/25/20 0500 .  insulin aspart (novoLOG) injection 0-5 Units, 0-5 Units, Subcutaneous, QHS, Vassie Loll, MD, 2 Units at 03/24/20 2111 .  insulin aspart (novoLOG) injection 0-9 Units, 0-9 Units, Subcutaneous, TID WC, Vassie Loll, MD, 1 Units at 03/24/20 1749 .  pantoprazole (PROTONIX) EC tablet 40 mg, 40 mg, Oral, Daily, Vassie Loll, MD, 40 mg at 03/25/20 0851 .  senna-docusate (Senokot-S) tablet 1 tablet, 1 tablet, Oral, QHS PRN, Vassie Loll, MD .  tamsulosin [facility])  capsule 0.4 mg, 0.4 mg, Oral, Daily, Vassie Loll, MD, 0.4 mg at 03/25/20 3500  Patients Current Diet:  Diet Order            Diet heart healthy/carb modified Room service appropriate? Yes; Fluid consistency: Thin  Diet effective now              Precautions / Restrictions Precautions Precautions: Fall Restrictions Weight Bearing Restrictions: No   Has the patient had 2 or more falls or a fall with injury in the past year? Yes twice 3 days pta  Prior Activity Level Community (5-7x/wk): Independent until 3 days pta; drives; very active  Prior Functional Level Self Care: Did the patient need help bathing, dressing, using the toilet or eating? Independent  Indoor Mobility: Did the patient need assistance with walking from room to room (with or without device)? Independent  Stairs: Did the patient need assistance with internal or external stairs (with or without device)? Independent  Functional Cognition: Did the patient need help planning regular tasks such as shopping or remembering to take medications? Independent  Home Assistive Devices / Equipment Home Assistive Devices/Equipment: Environmental consultant (specify type) Home Equipment: Walker - 2 wheels, Cane - single point, Grab bars - tub/shower  Prior Device Use: Indicate devices/aids used by the patient prior to current illness, exacerbation or injury? None of the above  Current Functional Level Cognition  Overall Cognitive Status: Within Functional Limits for tasks assessed Orientation Level: Oriented X4    Extremity Assessment (includes Sensation/Coordination)  Upper Extremity Assessment: Defer to OT evaluation RUE Deficits / Details: ROM<50% and decreased strength due to past RC injury RUE Sensation: WNL RUE Coordination: WNL LUE Deficits / Details: ROM WFL, strength 4/5 throughout. Decreased grip strength LUE Sensation: WNL(reports of numbness) LUE Coordination: WNL  Lower Extremity Assessment: Generalized weakness, RLE  deficits/detail, LLE deficits/detail RLE Deficits / Details: WNL RLE Sensation: WNL RLE Coordination: WNL LLE Deficits / Details: grossly -4/5 except ankle dorsiflexors, knee flexors, hip flexors  3+/5, LLE Sensation: decreased light touch LLE Coordination: decreased gross motor    ADLs  Overall ADL's : Needs assistance/impaired Grooming: Wash/dry face, Set up, Sitting Grooming Details (indicate cue type and reason): unable to complete in standing due to balance Upper Body Dressing : Minimal assistance, Sitting Upper Body Dressing Details (indicate cue type and reason): assist for managing gown ties/buttones Lower Body Dressing: Supervision/safety, Sitting/lateral leans Lower Body Dressing Details (indicate cue type and reason): balance deficits preventing completion in standing Toilet Transfer: Supervision/safety, Stand-pivot Toilet Transfer Details (indicate cue type and reason): simulated with bed to chair transfer General ADL Comments: Pt requiring increased time for safety and balance, max difficulty completing in standing due to balance deficits.     Mobility  Overal bed mobility: Needs Assistance Bed Mobility: Supine to Sit Supine to sit: Min guard, Min assist General bed mobility comments:  slow labored movement, limited use of LUE due decreased coordination    Transfers  Overall transfer level: Needs assistance Equipment used: Rolling walker (2 wheeled) Transfers: Sit to/from Stand, Stand Pivot Transfers Sit to Stand: Min guard Stand pivot transfers: Min guard, Supervision General transfer comment: unsteady on feet, labored movement, requires verbal cues for proper hand placement during stand to sitting    Ambulation / Gait / Stairs / Wheelchair Mobility  Ambulation/Gait Ambulation/Gait assistance: Herbalist (Feet): 65 Feet Assistive device: Rolling walker (2 wheeled) Gait Pattern/deviations: Decreased step length - left, Decreased stance time - left,  Decreased stride length, Trunk flexed General Gait Details: increased endurance/distance for taking steps, mild difficulty advancing LLE with limited ankle dorsiflexion, increased time for making turns, limited secondary to c/o fatigue Gait velocity: decreased    Posture / Balance Dynamic Sitting Balance Sitting balance - Comments: seated at EOB, good static, fair dynamic Balance Overall balance assessment: Needs assistance Sitting-balance support: Feet supported, No upper extremity supported Sitting balance-Leahy Scale: Good Sitting balance - Comments: seated at EOB, good static, fair dynamic Postural control: Posterior lean(when completing exercises) Standing balance support: During functional activity, Bilateral upper extremity supported Standing balance-Leahy Scale: Fair Standing balance comment: using RW    Special needs/care consideration BiPAP/CPAP  N/a CPM  N/a Continuous Drip IV  N/a Dialysis n/a Life Vest  N/a Oxygen  N/a Special Bed  N/a Trach Size  N/a Wound Vac n/a Skin intact                               Bowel mgmt: continent Bladder mgmt: continent Diabetic mgmt: Hgb A1c 7.1 Behavioral consideration  Chemo/radiation  Designated visitors are wife, frances and son, Jacqualine Mau his meds through the [facility]: Spouse/significant other  Lives With: Spouse Available Help at Discharge: Family, Available 24 hours/day Type of Home: House Home Layout: One level Home Access: Stairs to enter Entrance Stairs-Rails: None Technical brewer of Steps: 2 Bathroom Shower/Tub: Multimedia programmer: Standard Bathroom Accessibility: Yes How Accessible: Accessible via walker [place]: No  Discharge Living Setting Plans for Discharge Living Setting: Patient's home, Lives with (comment)(wife) Type of Home at Discharge: House Discharge Home Layout: One level Discharge Home Access: Stairs to enter Entrance  Stairs-Rails: None Entrance Stairs-Number of Steps: 2 Discharge Bathroom Shower/Tub: Walk-in shower Discharge Bathroom Toilet: Standard Discharge Bathroom Accessibility: Yes How Accessible: Accessible via walker Does the patient have any problems obtaining your medications?: No  Social/Family/Support Systems Patient Roles: Spouse, Parent Contact Information: wife, Joaquim Lai on home line best rather than cell Anticipated Caregiver: wife and son Anticipated Ambulance person Information: home [phone] Ability/Limitations of Caregiver: no limitations per wife Caregiver Availability: 24/7 Discharge Plan Discussed with Primary Caregiver: Yes Is Caregiver In Agreement with Plan?: Yes Does Caregiver/Family have Issues with Lodging/Transportation while Pt is in Rehab?: No  Goals/Additional Needs Patient/Family Goal for Rehab: Mod I to supervision PT, superivison to min OT Expected length of stay: ELOS 10 to 12 days Equipment Needs: [place] even with hearing aides Pt/Family Agrees to Admission and willing to participate: Yes Program Orientation Provided & Reviewed with Pt/Caregiver Including Roles  & Responsibilities: Yes  Decrease burden of Care through IP rehab admission: n/a Possible need for SNF placement upon discharge: n/a  Patient Condition: I have reviewed medical records from [facility], spoken with CSW, and patient and spouse. I discussed  via phone for inpatient rehabilitation assessment.  Patient will benefit from ongoing PT and OT, can actively participate in 3 hours of therapy a day 5 days of the week, and can make measurable gains during the admission.  Patient will also benefit from the coordinated team approach during an Inpatient Acute Rehabilitation admission.  The patient will receive intensive therapy as well as Rehabilitation physician, nursing, social worker, and care management interventions.  Due to bladder management, bowel management, safety, skin/wound  care, disease management, medication administration, pain management and patient education the patient requires 24 hour a day rehabilitation nursing.  The patient is currently min to mod assist with mobility and basic ADLs.  Discharge setting and therapy post discharge at home with home health is anticipated.  Patient has agreed to participate in the Acute Inpatient Rehabilitation Program and will admit today.  Preadmission Screen Completed By:  Clois Dupes, 03/25/2020 10:08 AM ______________________________________________________________________   Discussed status with Dr. [name]  on  03/24/2020 at  1000 and received approval for admission today.  Admission Coordinator:  Clois Dupes, RN, time  1000 Date  03/25/2020   Assessment/Plan: Diagnosis: Right thalamic infarct.   1. Does the need for close, 24 hr/day Medical supervision in concert with the patient's rehab needs make it unreasonable for this patient to be served in a less intensive setting? Potentially  2. Co-Morbidities requiring supervision/potential complications: GERD, Type 2 DM (Monitor in accordance with exercise and adjust meds as necessary), HTN (monitor and provide prns in accordance with increased physical exertion and pain), chronic kidney disease stage IIIb and BPH.  3. Due to safety, disease management and patient education, does the patient require 24 hr/day rehab nursing? Potentially 4. Does the patient require coordinated care of a physician, rehab nurse, PT, OT to address physical and functional deficits in the context of the above medical diagnosis(es)? Potentially Addressing deficits in the following areas: balance, endurance, locomotion, strength, transferring, bathing, dressing, toileting and psychosocial support 5. Can the patient actively participate in an intensive therapy program of at least 3 hrs of therapy 5 days a week? Yes 6. The potential for patient to make measurable gains while on  inpatient rehab is excellent 7. Anticipated functional outcomes upon discharge from inpatient rehab: modified independent and supervision PT, modified independent and supervision OT, n/a SLP 8. Estimated rehab length of stay to reach the above functional goals is: 6-10 days. 9. Anticipated discharge destination: Home 10. Overall Rehab/Functional Prognosis: good   MD Signature: Maryla Morrow, MD, ABPMR

## 2020-03-23 NOTE — Evaluation (Signed)
Speech Language Pathology Evaluation Patient Details Name: Martin Frazier. MRN: [id number] DOB: Sep 17, 1934 Today's Date: 03/23/2020 Time: 5956-3875 SLP Time Calculation (min) (ACUTE ONLY): 25 min  Problem List:  Patient Active Problem List   Diagnosis Date Noted  . Acute ischemic stroke ([place]) 03/23/2020  . Acute ischemic VBA thalamic stroke, right ([place]) 03/22/2020  . Abdominal pain, epigastric 03/26/2019  . Abnormal CT of the abdomen 03/26/2019  . Spinal stenosis of lumbar region with neurogenic claudication 05/21/2018  . Unspecified constipation 07/22/2014  . Diabetes ([place]) 07/22/2014  . Essential hypertension, benign 07/22/2014  . GERD (gastroesophageal reflux disease) 07/22/2014   Past Medical History:  Past Medical History:  Diagnosis Date  . Collapsed lung    left  . Diabetes mellitus without complication ([place])    over 10 yrs  . GERD (gastroesophageal reflux disease)   . Hypertension   . Kidney stones    Past Surgical History:  Past Surgical History:  Procedure Laterality Date  . APPENDECTOMY    . BIOPSY  04/01/2019   Procedure: BIOPSY;  Surgeon: Rogene Houston, MD;  Location: [facility];  Service: Endoscopy;;  gastric  . CYSTOSCOPY W/ URETERAL STENT PLACEMENT Right 04/03/2019   Procedure: CYSTOSCOPY WITH RETROGRADE PYELOGRAM/URETERAL STENT PLACEMENT;  Surgeon: Franchot Gallo, MD;  Location: [facility];  Service: Urology;  Laterality: Right;  . ESOPHAGOGASTRODUODENOSCOPY N/A 04/01/2019   Procedure: ESOPHAGOGASTRODUODENOSCOPY (EGD);  Surgeon: Rogene Houston, MD;  Location: [facility];  Service: Endoscopy;  Laterality: N/A;  10:55am  . EXTRACORPOREAL SHOCK WAVE LITHOTRIPSY Right 04/16/2019   Procedure: EXTRACORPOREAL SHOCK WAVE LITHOTRIPSY (ESWL);  Surgeon: Festus Aloe, MD;  Location: [facility];  Service: Urology;  Laterality: Right;  . HEMORRHOID SURGERY    . KIDNEY STONE SURGERY    . TONSILLECTOMY     HPI:  Martin Beske. is a 86 y.o. male with a past  medical history significant for gastroesophageal reflux disease, type 2 diabetes mellitus, hypertension, chronic kidney disease a stage IIIb and BPH; who presented to the hospital after experiencing for the last 48-72 hours intermittent episode of confusion/foggy mental status along with left side weakness, numbness and off-balance sensation.  Family expressed that due to his of balance patient and then the following and landed on his left side on 03/21/2020.  There has not been any fever, cough, chest pain, nausea, vomiting, hematuria, melena, hematochezia, headaches, blurred vision or contact with anyone experiencing URI symptoms. In the ED CT head was negative for acute intracranial normalities; MRI positive for acute right thalamic infarct.    Assessment / Plan / Recommendation Clinical Impression  SLP administered speech language evaluation while Pt was seated upright in bed and Pt's wife was present for eval. Pt was administered the [facility] SLUMS examination and he achieved a score of 25/30 indicating his cognitive functioning to be WNL. Pt without any other speech or language impairments noted. He does report that he is often impacted by being hard of hearing and that since the stroke sometimes he requires additional time to "think" however this was very mildly noted during evaluation. Pt is highly motivated to return to PLOF and return home. There are no further ST needs noted at this time. ST to sign off.    SLP Assessment  SLP Recommendation/Assessment: Patient does not need any further Speech Lanaguage Pathology Services             SLP Evaluation Cognition  Overall Cognitive Status: Within Functional Limits for tasks assessed Orientation  Level: Oriented to person;Oriented to place;Oriented to situation       Comprehension  Auditory Comprehension Overall Auditory Comprehension: Appears within functional limits for tasks assessed Visual Recognition/Discrimination Discrimination: Not  tested Reading Comprehension Reading Status: Not tested    Expression Expression Primary Mode of Expression: Verbal Verbal Expression Overall Verbal Expression: Appears within functional limits for tasks assessed Initiation: No impairment Written Expression Dominant Hand: Right Written Expression: Not tested   Oral / Motor  Oral Motor/Sensory Function Overall Oral Motor/Sensory Function: Within functional limits Motor Speech Overall Motor Speech: Appears within functional limits for tasks assessed     Martin Frazier H. Romie Levee, CCC-SLP Speech Language Pathologist         Georgetta Haber 03/23/2020, 1:13 PM

## 2020-03-23 NOTE — Plan of Care (Signed)
  Problem: Acute Rehab PT Goals(only PT should resolve) Goal: Pt Will Go Supine/Side To Sit Outcome: Progressing Flowsheets (Taken 03/23/2020 1121) Pt will go Supine/Side to Sit:  Independently  with modified independence Goal: Patient Will Transfer Sit To/From Stand Outcome: Progressing Flowsheets (Taken 03/23/2020 1121) Patient will transfer sit to/from stand:  with modified independence  with supervision Goal: Pt Will Transfer Bed To Chair/Chair To Bed Outcome: Progressing Flowsheets (Taken 03/23/2020 1121) Pt will Transfer Bed to Chair/Chair to Bed:  with modified independence  with supervision Goal: Pt Will Ambulate Outcome: Progressing Flowsheets (Taken 03/23/2020 1121) Pt will Ambulate:  75 feet  with supervision  with min guard assist  with rolling walker   11:21 AM, 03/23/20 Ocie Bob, MPT Physical Therapist with [facility] [phone] office [phone] mobile phone

## 2020-03-23 NOTE — Progress Notes (Signed)
*  PRELIMINARY RESULTS* Echocardiogram 2D Echocardiogram has been performed.  Stacey Drain 03/23/2020, 2:41 PM

## 2020-03-23 NOTE — Clinical Social Work Note (Signed)
Martin Frazier with [facility] will review patient's referral for appropriateness.    Shaquille Janes, Juleen China, LCSW

## 2020-03-23 NOTE — Plan of Care (Signed)
  Problem: Acute Rehab OT Goals (only OT should resolve) Goal: Pt. Will Perform Grooming Flowsheets (Taken 03/23/2020 1002) Pt Will Perform Grooming:  with min guard assist  standing Goal: Pt. Will Perform Lower Body Dressing Flowsheets (Taken 03/23/2020 1002) Pt Will Perform Lower Body Dressing:  with supervision  sitting/lateral leans  sit to/from stand Goal: Pt. Will Transfer To Toilet Flowsheets (Taken 03/23/2020 1002) Pt Will Transfer to Toilet:  with supervision  ambulating  regular height toilet Goal: Pt. Will Perform Toileting-Clothing Manipulation Flowsheets (Taken 03/23/2020 1002) Pt Will Perform Toileting - Clothing Manipulation and hygiene:  with supervision  sitting/lateral leans  sit to/from stand Goal: Pt/Caregiver Will Perform Home Exercise Program Flowsheets (Taken 03/23/2020 1002) Pt/caregiver will Perform Home Exercise Program:  Increased strength  Left upper extremity  Independently  With written HEP provided

## 2020-03-23 NOTE — Consult Note (Signed)
[facility] Martin Frazier A. Gerilyn Pilgrim, MD     [email or web address]          [name] is an 86 y.o. male.   ASSESSMENT/PLAN: 1. Acute right thalamic infarct: This is a lacunar event. Risk factors includes age, hypertension and diabetes. Aspirin 325 is recommended along with blood pressure and diabetes control. A statin is also suggested.    The patient is an 86 year old right-handed white male who presents with a one-day history of unsteadiness of balance and the what appears to be dysmetria of the left upper and lower extremity. There is also reports of numbness involving the left side. The patient does not report any dysarthria, dysphagia, diplopia or loss of consciousness. There are no reports of chest pain or shortness of breath.  The review systems otherwise negative.    GENERAL:  This is a pleasant thin male who is in no acute distress  HEENT:  The neck is supple no trauma appreciated.  ABDOMEN: soft  EXTREMITIES: No edema   BACK: This is normal.  SKIN: Normal by inspection.    MENTAL STATUS: Alert and mostly oriented although he reported his age as 89. Speech, language and cognition are generally intact. Judgment and insight fair.   CRANIAL NERVES: Pupils are equal, round and reactive to light and accomodation; extra ocular movements are full, there is no significant nystagmus; visual fields are full; upper and lower facial muscles are normal in strength and symmetric, there is no flattening of the nasolabial folds; tongue is midline; uvula is midline; shoulder elevation is normal.  MOTOR: Normal tone, bulk and strength; no pronator drift.  COORDINATION:  There is mild dysmetria of the left upper extremity and left leg. The right side is normal. No rest tremor; no intention tremor; no postural tremor; no bradykinesia.  REFLEXES: Deep tendon reflexes are symmetrical and normal.   SENSATION: Normal to light touch, temperature, and pain.    NIH stroke scale 1,  2 total 3.   Blood pressure 131/66, pulse 87, temperature 98.1 F (36.7 C), temperature source Oral, resp. rate 16, height 5\' 8"  (1.727 m), weight 59 kg, SpO2 97 %.  Past Medical History:  Diagnosis Date  . Collapsed lung    left  . Diabetes mellitus without complication (HCC)    over 10 yrs  . GERD (gastroesophageal reflux disease)   . Hypertension   . Kidney stones     Past Surgical History:  Procedure Laterality Date  . APPENDECTOMY    . BIOPSY  04/01/2019   Procedure: BIOPSY;  Surgeon: 06/01/2019, MD;  [facility];  Service: Endoscopy;;  gastric  . CYSTOSCOPY W/ URETERAL STENT PLACEMENT Right 04/03/2019   Procedure: CYSTOSCOPY WITH RETROGRADE PYELOGRAM/URETERAL STENT PLACEMENT;  Surgeon: 06/03/2019, MD;  Location: [facility];  Service: Urology;  Laterality: Right;  . ESOPHAGOGASTRODUODENOSCOPY N/A 04/01/2019   Procedure: ESOPHAGOGASTRODUODENOSCOPY (EGD);  Surgeon: 06/01/2019, MD;  [facility];  Service: Endoscopy;  Laterality: N/A;  10:55am  . EXTRACORPOREAL SHOCK WAVE LITHOTRIPSY Right 04/16/2019   Procedure: EXTRACORPOREAL SHOCK WAVE LITHOTRIPSY (ESWL);  Surgeon: 04/18/2019, MD;  Location: [facility];  Service: Urology;  Laterality: Right;  . HEMORRHOID SURGERY    . KIDNEY STONE SURGERY    . TONSILLECTOMY      No family history on file.  Social History:  reports that he has quit smoking. His smoking use included cigarettes. He has never used smokeless tobacco. He reports that he does not drink  alcohol or use drugs.  Allergies:  Allergies  Allergen Reactions  . Sulfa Antibiotics Nausea And Vomiting    Medications: Prior to Admission medications   Medication Sig Start Date End Date Taking? Authorizing Provider  Cholecalciferol (VITAMIN D) 2000 units CAPS Take 1 capsule by mouth daily.   Yes [provider]  Ginkgo Biloba (GINKOBA PO) Take 1 tablet by mouth daily.    Yes [provider]  Lactobacillus  (ACIDOPHILUS PO) Take 1 capsule by mouth daily.   Yes [provider]  Magnesium 250 MG TABS Take 1 tablet by mouth daily.   Yes [provider]  metFORMIN (GLUCOPHAGE) 500 MG tablet Take 500 mg by mouth daily with breakfast.    Yes [provider]  omeprazole (PRILOSEC) 40 MG capsule Take 40 mg by mouth daily.   Yes [provider]  ramipril (ALTACE) 2.5 MG capsule Take 2.5 mg by mouth daily.   Yes [provider]  Saw Palmetto, Serenoa repens, (SAW PALMETTO PO) Take 1 capsule by mouth daily.   Yes [provider]  tamsulosin (FLOMAX) 0.4 MG CAPS capsule Take 0.4 mg by mouth daily.    Yes [provider]  vitamin B-12 (CYANOCOBALAMIN) 1000 MCG tablet Take 1,000 mcg by mouth daily.    Yes [provider]  vitamin E 400 UNIT capsule Take 400 Units by mouth daily.   Yes [provider]    Scheduled Meds: . aspirin  300 mg Rectal Daily   Or  . aspirin  325 mg Oral Daily  . atorvastatin  10 mg Oral q1800  . heparin injection (subcutaneous)  5,000 Units Subcutaneous Q8H  . insulin aspart  0-5 Units Subcutaneous QHS  . insulin aspart  0-9 Units Subcutaneous TID WC  . pantoprazole  40 mg Oral Daily  . tamsulosin  0.4 mg Oral Daily   Continuous Infusions: PRN Meds:.acetaminophen **OR** acetaminophen (TYLENOL) oral liquid 160 mg/5 mL **OR** acetaminophen, senna-docusate     Results for orders placed or performed during the hospital encounter of 03/22/20 (from the past 48 hour(s))  I-stat chem 8, ED     Status: Abnormal   Collection Time: 03/22/20 10:28 AM  Result Value Ref Range   Sodium 139 135 - 145 mmol/L   Potassium 4.4 3.5 - 5.1 mmol/L   Chloride 102 98 - 111 mmol/L   BUN 27 (H) 8 - 23 mg/dL   Creatinine, Ser 7.51 (H) 0.61 - 1.24 mg/dL   Glucose, Bld 025 (H) 70 - 99 mg/dL    Comment: Glucose reference range applies only to samples taken after fasting for at least 8 hours.   Calcium, Ion 1.20 1.15 -  1.40 mmol/L   TCO2 27 22 - 32 mmol/L   Hemoglobin 13.6 13.0 - 17.0 g/dL   HCT 85.2 77.8 - 24.2 %  Ethanol     Status: None   Collection Time: 03/22/20 10:31 AM  Result Value Ref Range   Alcohol, Ethyl (B) <10 <10 mg/dL    Comment: (NOTE) Lowest detectable limit for serum alcohol is 10 mg/dL. For medical purposes only. Performed at [facility], [address] [place], [place] [address]   Protime-INR     Status: None   Collection Time: 03/22/20 10:31 AM  Result Value Ref Range   Prothrombin Time 13.1 11.4 - 15.2 seconds   INR 1.0 0.8 - 1.2    Comment: (NOTE) INR goal varies based on device and disease states. Performed at [facility], [phone]  [address], [place], [place] [address]   APTT     Status: None   Collection Time: 03/22/20 10:31 AM  Result Value Ref Range   aPTT 28 24 - 36 seconds    Comment: Performed at [facility], [address] [place], [place] [address]  CBC     Status: Abnormal   Collection Time: 03/22/20 10:31 AM  Result Value Ref Range   WBC 7.2 4.0 - 10.5 K/uL   RBC 4.04 (L) 4.22 - 5.81 MIL/uL   Hemoglobin 13.1 13.0 - 17.0 g/dL   HCT 23.9 53.2 - 02.3 %   MCV 99.5 80.0 - 100.0 fL   MCH 32.4 26.0 - 34.0 pg   MCHC 32.6 30.0 - 36.0 g/dL   RDW 34.3 56.8 - 61.6 %   Platelets 162 150 - 400 K/uL   nRBC 0.0 0.0 - 0.2 %    Comment: Performed at [facility], [address], [place], [place] [address]  Differential     Status: None   Collection Time: 03/22/20 10:31 AM  Result Value Ref Range   Neutrophils Relative % 71 %   Neutro Abs 5.1 1.7 - 7.7 K/uL   Lymphocytes Relative 16 %   Lymphs Abs 1.1 0.7 - 4.0 K/uL   Monocytes Relative 12 %   Monocytes Absolute 0.9 0.1 - 1.0 K/uL   Eosinophils Relative 1 %   Eosinophils Absolute 0.1 0.0 - 0.5 K/uL   Basophils Relative 0 %   Basophils Absolute 0.0 0.0 - 0.1 K/uL   Immature Granulocytes 0 %   Abs Immature Granulocytes 0.02 0.00 - 0.07 K/uL    Comment: Performed at [facility], [address], [place], [place]  [address]  Comprehensive metabolic panel     Status: Abnormal   Collection Time: 03/22/20 10:31 AM  Result Value Ref Range   Sodium 139 135 - 145 mmol/L   Potassium 4.2 3.5 - 5.1 mmol/L   Chloride 103 98 - 111 mmol/L   CO2 27 22 - 32 mmol/L   Glucose, Bld 135 (H) 70 - 99 mg/dL    Comment: Glucose reference range applies only to samples taken after fasting for at least 8 hours.   BUN 29 (H) 8 - 23 mg/dL   Creatinine, Ser 5.52 (H) 0.61 - 1.24 mg/dL   Calcium 9.3 8.9 - 08.0 mg/dL   Total Protein 6.8 6.5 - 8.1 g/dL   Albumin 4.1 3.5 - 5.0 g/dL   AST 30 15 - 41 U/L   ALT 22 0 - 44 U/L   Alkaline Phosphatase 128 (H) 38 - 126 U/L   Total Bilirubin 0.8 0.3 - 1.2 mg/dL   GFR calc non Af Amer 43 (L) >60 mL/min   GFR calc Af Amer 50 (L) >60 mL/min   Anion gap 9 5 - 15    Comment: Performed at [facility], [address], [place], [place] [address]  TSH     Status: None   Collection Time: 03/22/20 10:31 AM  Result Value Ref Range   TSH 0.915 0.350 - 4.500 uIU/mL    Comment: Performed by a 3rd Generation assay with a functional sensitivity of <=0.01 uIU/mL. Performed at [facility], [address], [place], [place] [address]   Vitamin B12     Status: Abnormal   Collection Time: 03/22/20 10:31 AM  Result Value Ref Range   Vitamin B-12 941 (H) 180 - 914 pg/mL    Comment: (NOTE) This assay is not validated for testing neonatal  or myeloproliferative syndrome specimens for Vitamin B12 levels. Performed at [facility], [address], [place], [place] [address]   Urine rapid drug screen (hosp performed)     Status: None   Collection Time: 03/22/20 11:38 AM  Result Value Ref Range   Opiates NONE DETECTED NONE DETECTED   Cocaine NONE DETECTED NONE DETECTED   Benzodiazepines NONE DETECTED NONE DETECTED   Amphetamines NONE DETECTED NONE DETECTED   Tetrahydrocannabinol NONE DETECTED NONE DETECTED   Barbiturates NONE DETECTED NONE DETECTED    Comment: (NOTE) DRUG SCREEN FOR MEDICAL  PURPOSES ONLY.  IF CONFIRMATION IS NEEDED FOR ANY PURPOSE, NOTIFY LAB WITHIN 5 DAYS. LOWEST DETECTABLE LIMITS FOR URINE DRUG SCREEN Drug Class                     Cutoff (ng/mL) Amphetamine and metabolites    1000 Barbiturate and metabolites    200 Benzodiazepine                 485 Tricyclics and metabolites     300 Opiates and metabolites        300 Cocaine and metabolites        300 THC                            50 Performed at [facility], [address], [place], [place] [address]   Urinalysis, Routine w reflex microscopic     Status: Abnormal   Collection Time: 03/22/20 11:38 AM  Result Value Ref Range   Color, Urine YELLOW YELLOW   APPearance CLEAR CLEAR   Specific Gravity, Urine 1.016 1.005 - 1.030   pH 7.0 5.0 - 8.0   Glucose, UA 150 (A) NEGATIVE mg/dL   Hgb urine dipstick NEGATIVE NEGATIVE   Bilirubin Urine NEGATIVE NEGATIVE   Ketones, ur NEGATIVE NEGATIVE mg/dL   Protein, ur 30 (A) NEGATIVE mg/dL   Nitrite NEGATIVE NEGATIVE   Leukocytes,Ua NEGATIVE NEGATIVE   RBC / HPF 0-5 0 - 5 RBC/hpf   WBC, UA 0-5 0 - 5 WBC/hpf   Bacteria, UA NONE SEEN NONE SEEN   Mucus PRESENT     Comment: Performed at [facility], [address], [place], [place] [address]  SARS CORONAVIRUS 2 (TAT 6-24 HRS) Nasopharyngeal Nasopharyngeal Swab     Status: None   Collection Time: 03/22/20  2:40 PM   Specimen: Nasopharyngeal Swab  Result Value Ref Range   SARS Coronavirus 2 NEGATIVE NEGATIVE    Comment: (NOTE) SARS-CoV-2 target nucleic acids are NOT DETECTED. The SARS-CoV-2 RNA is generally detectable in upper and lower respiratory specimens during the acute phase of infection. Negative results do not preclude SARS-CoV-2 infection, do not rule out co-infections with other pathogens, and should not be used as the sole basis for treatment or other patient management decisions. Negative results must be combined with clinical observations, patient history, and epidemiological information. The  expected result is Negative. Fact Sheet for Patients: SugarRoll.be Fact Sheet for Healthcare Providers: https://www.woods-mathews.com/ This test is not yet approved or cleared by the [place] FDA and  has been authorized for detection and/or diagnosis of SARS-CoV-2 by FDA under an Emergency Use Authorization (EUA). This EUA will remain  in effect (meaning this test can be used) for the duration of the COVID-19 declaration under Section 56 4(b)(1) of the Act, 21 U.S.C. section 360bbb-3(b)(1), unless the authorization is terminated or revoked sooner. Performed at [facility], [address] [place], [place] [address]   Glucose, capillary     Status: Abnormal   Collection Time: 03/22/20  5:57 PM  Result Value Ref Range   Glucose-Capillary 133 (H) 70 - 99 mg/dL    Comment: Glucose reference range applies only to samples taken after fasting for at least 8 hours.  Hemoglobin A1c     Status: Abnormal   Collection Time: 03/23/20  5:32 AM  Result Value Ref Range   Hgb A1c MFr Bld 7.1 (H) 4.8 - 5.6 %    Comment: (NOTE) Pre diabetes:          5.7%-6.4% Diabetes:              >6.4% Glycemic control for   <7.0% adults with diabetes    Mean Plasma Glucose 157.07 mg/dL    Comment: Performed at [facility], [address] [place], [place] [address]  Lipid panel     Status: None   Collection Time: 03/23/20  5:32 AM  Result Value Ref Range   Cholesterol 154 0 - 200 mg/dL   Triglycerides 147 <829 mg/dL   HDL 46 >56 mg/dL   Total CHOL/HDL Ratio 3.3 RATIO   VLDL 21 0 - 40 mg/dL   LDL Cholesterol 87 0 - 99 mg/dL    Comment:        Total Cholesterol/HDL:CHD Risk Coronary Heart Disease Risk Table                     Men   Women  1/2 Average Risk   3.4   3.3  Average Risk       5.0   4.4  2 X Average Risk   9.6   7.1  3 X Average Risk  23.4   11.0        Use the calculated Patient Ratio above and the CHD Risk Table to determine  the patient's CHD Risk.        ATP III CLASSIFICATION (LDL):  <100     mg/dL   Optimal  213-086  mg/dL   Near or Above                    Optimal  130-159  mg/dL   Borderline  578-469  mg/dL   High  >629     mg/dL   Very High Performed at [facility], [address], [place], [place] [address]   CBC     Status: Abnormal   Collection Time: 03/23/20  5:32 AM  Result Value Ref Range   WBC 6.9 4.0 - 10.5 K/uL   RBC 4.10 (L) 4.22 - 5.81 MIL/uL   Hemoglobin 13.2 13.0 - 17.0 g/dL   HCT 32.4 40.1 - 02.7 %   MCV 99.3 80.0 - 100.0 fL   MCH 32.2 26.0 - 34.0 pg   MCHC 32.4 30.0 - 36.0 g/dL   RDW 25.3 66.4 - 40.3 %   Platelets 174 150 - 400 K/uL   nRBC 0.0 0.0 - 0.2 %    Comment: Performed at [facility], [address] [place], [place] [address]  Basic metabolic panel     Status: Abnormal   Collection Time: 03/23/20  5:32 AM  Result Value Ref Range   Sodium 138 135 - 145 mmol/L   Potassium 4.5 3.5 - 5.1 mmol/L   Chloride 102 98 - 111 mmol/L   CO2 27 22 - 32 mmol/L   Glucose, Bld 126 (H)  70 - 99 mg/dL    Comment: Glucose reference range applies only to samples taken after fasting for at least 8 hours.   BUN 26 (H) 8 - 23 mg/dL   Creatinine, Ser 5.36 (H) 0.61 - 1.24 mg/dL   Calcium 9.0 8.9 - 64.4 mg/dL   GFR calc non Af Amer 38 (L) >60 mL/min   GFR calc Af Amer 44 (L) >60 mL/min   Anion gap 9 5 - 15    Comment: Performed at [facility], [address], [place], [place] [address]  Folate     Status: None   Collection Time: 03/23/20  7:49 AM  Result Value Ref Range   Folate 15.2 >5.9 ng/mL    Comment: Performed at [facility], [address], [place], [place] [address]  Ammonia     Status: None   Collection Time: 03/23/20  7:49 AM  Result Value Ref Range   Ammonia 20 9 - 35 umol/L    Comment: Performed at [facility], [address], [place], [place] [address]  Glucose, capillary     Status: Abnormal   Collection Time: 03/23/20  8:16 AM  Result Value Ref Range    Glucose-Capillary 110 (H) 70 - 99 mg/dL    Comment: Glucose reference range applies only to samples taken after fasting for at least 8 hours.  Glucose, capillary     Status: Abnormal   Collection Time: 03/23/20 11:16 AM  Result Value Ref Range   Glucose-Capillary 230 (H) 70 - 99 mg/dL    Comment: Glucose reference range applies only to samples taken after fasting for at least 8 hours.  Glucose, capillary     Status: Abnormal   Collection Time: 03/23/20  4:25 PM  Result Value Ref Range   Glucose-Capillary 144 (H) 70 - 99 mg/dL    Comment: Glucose reference range applies only to samples taken after fasting for at least 8 hours.    Studies/Results: CAROTID IMPRESSION: Atherosclerotic plaque at the carotid bulbs and proximal internal carotid arteries bilaterally. Estimated degree of stenosis in the internal carotid arteries is less than 50% bilaterally.  Patent vertebral arteries with antegrade flow.   TTE  1. Left ventricular ejection fraction, by estimation, is 60 to 65%. The  left ventricle has normal function. The left ventricle has no regional  wall motion abnormalities. Left ventricular diastolic parameters are  consistent with Grade I diastolic  dysfunction (impaired relaxation).  2. Right ventricular systolic function is normal. The right ventricular  size is normal. There is normal pulmonary artery systolic pressure.  3. The mitral valve is normal in structure. Trivial mitral valve  regurgitation. No evidence of mitral stenosis.  4. The aortic valve is tricuspid. Aortic valve regurgitation is mild. No  aortic stenosis is present.  5. The inferior vena cava is normal in size with greater than 50%  respiratory variability, suggesting right atrial pressure of 3 mmHg.     BRAIN MRI MRA FINDINGS: MRI HEAD FINDINGS  Brain: There is a 1.2 cm area of restricted diffusion within the lateral right thalamus. No evidence of intracranial hemorrhage. Patchy and confluent  areas of T2 hyperintensity in the supratentorial white matter nonspecific but probably reflect mild to moderate chronic microvascular ischemic changes. There are chronic small vessel infarcts of the left frontal white matter, left caudate, and left cerebellum. Prominence of the ventricles and sulci reflects mild generalized parenchymal volume loss.  There is no intracranial mass, mass effect, or edema. There is no hydrocephalus or extra-axial  fluid collection.  Vascular: Major vessel flow voids at the skull base are preserved.  Skull and upper cervical spine: Normal marrow signal is preserved.  Sinuses/Orbits: Minor mucosal thickening.  Orbits are unremarkable.  Other: Sella is unremarkable.  Mastoid air cells are clear.  MRA HEAD FINDINGS  Intracranial internal carotid arteries are patent. There is moderate to marked stenosis of the right paraclinoid ICA. Middle and anterior cerebral arteries are patent. 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 Intracranial vertebral arteries, basilar artery, posterior cerebral arteries are patent. Right posterior communicating artery is present with fetal origin of the right PCA. There is no significant stenosis or aneurysm.  IMPRESSION: Acute right thalamic infarct.  Chronic microvascular ischemic changes and chronic small vessel infarcts as described.  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     The brain MRI scan and MRA scan reviewed in person. This is an acute infarct involving the lateral thalamic area on the right side. There is areas of small remote infarct involving the deep left frontal areas. There is moderate atrophy and chronic white matter chronic ischemic changes. MRA shows right PCA fetal circulation and the right A1 ACA absent.   Abeeha Twist A. Gerilyn Pilgrim, M.D.  Diplomate, Biomedical engineer of Psychiatry and Neurology ( Neurology). 03/23/2020, 7:09 PM

## 2020-03-23 NOTE — Progress Notes (Signed)
PROGRESS NOTE  Martin Frazier. FGH:[id number] DOB: [birth year] DOA: 03/22/2020 PCP: Leeanne Rio, MD  Brief History:  86 year old male with a history of diabetes mellitus type 2, hypertension, CKD stage IIIb presenting with approximately 1 to 2-day history of numbness in his left hand and left leg as well as weakness in his left upper extremity.  In addition, the patient has had some gait instability resulting in a fall on the day prior to admission.  He denied syncope.  He denied any fevers, chills, chest pain, shortness breath, nausea, vomiting, diarrhea, abdominal pain.  His wife feels that the patient has had a foggy sensorium over the past 2 days prior to admission.  The patient denies any headache, visual disturbance, speech difficulty or facial droop.  In the emergency department, MRI of the brain showed an acute right thalamic infarct.  He was admitted for further evaluation and treatment.  Assessment/Plan: Acute right thalamic stroke -Neurology Consult -PT/OT evaluation -Speech therapy eval -CT brain--neg -MRI brain--acute right thalamic infarct -MRA brain--moderate to severe focal stenosis of the right paraclinoid ICA -Carotid Duplex-- -Echo-- -LDL--87 -HbA1C--7.1 -Antiplatelet--ASA 325 mg  Essential hypertension -Holding ramipril -Allow for permissive hypertension  Diabetes mellitus type 2 -Holding metformin -03/23/2020 hemoglobin A1c 7.1 -NovoLog sliding scale  CKD stage IIIb -Baseline creatinine 6.7-8.9  Acute metabolic encephalopathy -Appears to have improved -UA negative for pyuria -TSH 0.915 -Serum F81--017 -Folic acid -Ammonia  BPH -Continue tamsulosin         Disposition Plan: Patient From: Home D/C Place: Home- 1-2  Days Barriers: Not Clinically Stable--stroke workup in progress; neurology clearance  Family Communication:   No Family at bedside  Consultants:  neurology  Code Status:  FULL  DVT Prophylaxis:  [place] Heparin    Procedures: As Listed in Progress Note Above  Antibiotics: None   Subjective: Patient still has some numbness and tingling in his left upper extremity.  He feels he is still a little weak on his left side but better than yesterday.  He denies any headache, visual disturbance, dysarthria, chest pain, shortness breath, nausea, vomiting, diarrhea, abdominal pain.  There is no dysuria or hematuria.  Objective: Vitals:   03/23/20 0038 03/23/20 0316 03/23/20 0554 03/23/20 0729  BP: 128/65 132/73 139/68   Pulse: 88 90 89   Resp: 18 18 (!) 21   Temp: 98.2 F (36.8 C) 98.8 F (37.1 C) 98 F (36.7 C)   TempSrc: Oral Oral Oral   SpO2: 97% 96% 96% 97%  Weight:      Height:        Intake/Output Summary (Last 24 hours) at 03/23/2020 5102 Last data filed at 03/23/2020 0400 Gross per 24 hour  Intake 125.43 ml  Output --  Net 125.43 ml   Weight change:  Exam:   General:  Pt is alert, follows commands appropriately, not in acute distress  HEENT: No icterus, No thrush, No neck mass, [place]/AT  Cardiovascular: RRR, S1/S2, no rubs, no gallops  Respiratory: CTA bilaterally, no wheezing, no crackles, no rhonchi  Abdomen: Soft/+BS, non tender, non distended, no guarding  Extremities: No edema, No lymphangitis, No petechiae, No rashes, no synovitis  Neuro:  CN II-XII intact, strength 4/5 in RUE, RLE, strength 4/5 LUE, LLE; sensation intact bilateral; no dysmetria; babinski equivocal     Data Reviewed: I have personally reviewed following labs and imaging studies Basic Metabolic Panel: Recent Labs  Lab 03/22/20 1028 03/22/20 1031 03/23/20 0532  NA 139 139 138  K 4.4 4.2 4.5  CL 102 103 102  CO2  --  27 27  GLUCOSE 132* 135* 126*  BUN 27* 29* 26*  CREATININE 1.60* 1.49* 1.65*  CALCIUM  --  9.3 9.0   Liver Function Tests: Recent Labs  Lab 03/22/20 1031  AST 30  ALT 22  ALKPHOS 128*  BILITOT 0.8  PROT 6.8  ALBUMIN 4.1   No results for input(s): LIPASE, AMYLASE in  the last 168 hours. No results for input(s): AMMONIA in the last 168 hours. Coagulation Profile: Recent Labs  Lab 03/22/20 1031  INR 1.0   CBC: Recent Labs  Lab 03/22/20 1028 03/22/20 1031 03/23/20 0532  WBC  --  7.2 6.9  NEUTROABS  --  5.1  --   HGB 13.6 13.1 13.2  HCT 40.0 40.2 40.7  MCV  --  99.5 99.3  PLT  --  162 174   Cardiac Enzymes: No results for input(s): CKTOTAL, CKMB, CKMBINDEX, TROPONINI in the last 168 hours. BNP: Invalid input(s): POCBNP CBG: Recent Labs  Lab 03/22/20 1757  GLUCAP 133*   HbA1C: Recent Labs    03/23/20 0532  HGBA1C 7.1*   Urine analysis:    Component Value Date/Time   COLORURINE YELLOW 03/22/2020 1138   APPEARANCEUR CLEAR 03/22/2020 1138   LABSPEC 1.016 03/22/2020 1138   PHURINE 7.0 03/22/2020 1138   GLUCOSEU 150 (A) 03/22/2020 1138   HGBUR NEGATIVE 03/22/2020 1138   BILIRUBINUR NEGATIVE 03/22/2020 1138   KETONESUR NEGATIVE 03/22/2020 1138   PROTEINUR 30 (A) 03/22/2020 1138   NITRITE NEGATIVE 03/22/2020 1138   LEUKOCYTESUR NEGATIVE 03/22/2020 1138   Sepsis Labs: @LABRCNTIP (procalcitonin:4,lacticidven:4) ) Recent Results (from the past 240 hour(s))  SARS CORONAVIRUS 2 ([place] 6-24 HRS) Nasopharyngeal Nasopharyngeal Swab     Status: None   Collection Time: 03/22/20  2:40 PM   Specimen: Nasopharyngeal Swab  Result Value Ref Range Status   SARS Coronavirus 2 NEGATIVE NEGATIVE Final    Comment: (NOTE) SARS-CoV-2 target nucleic acids are NOT DETECTED. The SARS-CoV-2 RNA is generally detectable in upper and lower respiratory specimens during the acute phase of infection. Negative results do not preclude SARS-CoV-2 infection, do not rule out co-infections with other pathogens, and should not be used as the sole basis for treatment or other patient management decisions. Negative results must be combined with clinical observations, patient history, and epidemiological information. The expected result is Negative. Fact Sheet for  Patients: 03/24/20 Fact Sheet for Healthcare Providers: HairSlick.no This test is not yet approved or cleared by the quierodirigir.com FDA and  has been authorized for detection and/or diagnosis of SARS-CoV-2 by FDA under an Emergency Use Authorization (EUA). This EUA will remain  in effect (meaning this test can be used) for the duration of the COVID-19 declaration under Section 56 4(b)(1) of the Act, 21 U.S.C. section 360bbb-3(b)(1), unless the authorization is terminated or revoked sooner. Performed at [facility], [address], [place], [place]      Scheduled Meds: .  stroke: mapping our early stages of recovery book   Does not apply Once  . aspirin  300 mg Rectal Daily   Or  . aspirin  325 mg Oral Daily  . heparin injection (subcutaneous)  5,000 Units Subcutaneous Q8H  . insulin aspart  0-5 Units Subcutaneous QHS  . insulin aspart  0-9 Units Subcutaneous TID WC  . pantoprazole  40 mg Oral Daily  . ramipril  2.5 mg Oral Daily  .  tamsulosin  0.4 mg Oral Daily   Continuous Infusions: . sodium chloride 50 mL/hr at 03/22/20 1447    Procedures/Studies: CT HEAD WO CONTRAST  Result Date: 03/22/2020 CLINICAL DATA:  Confusion EXAM: CT HEAD WITHOUT CONTRAST TECHNIQUE: Contiguous axial images were obtained from the base of the skull through the vertex without intravenous contrast. COMPARISON:  None. FINDINGS: Brain: No evidence of acute infarction, hemorrhage, hydrocephalus, extra-axial collection or mass lesion/mass effect. Focal encephalomalacia from lacunar infarct in the right thalamus. Additional small lacunar infarcts in the left corona radiata. Mild-moderate low-density changes within the periventricular and subcortical white matter compatible with chronic microvascular ischemic change. Mild diffuse cerebral volume loss. Vascular: Atherosclerotic calcifications involving the large vessels of the skull  base. No unexpected hyperdense vessel. Skull: Normal. Negative for fracture or focal lesion. Sinuses/Orbits: No acute finding. Other: None. IMPRESSION: 1. No acute intracranial findings. 2. Chronic microvascular ischemic changes and cerebral volume loss. 3. Focal encephalomalacia from remote lacunar infarct in the right thalamus. Additional small remote lacunar infarcts in the left corona radiata. Electronically Signed   By: Duanne Guess D.O.   On: 03/22/2020 11:07   MR ANGIO HEAD WO CONTRAST  Result Date: 03/22/2020 CLINICAL DATA:  Confusion EXAM: MRI HEAD WITHOUT CONTRAST MRA HEAD WITHOUT CONTRAST TECHNIQUE: Multiplanar, multiecho pulse sequences of the brain and surrounding structures were obtained without intravenous contrast. Angiographic images of the head were obtained using MRA technique without contrast. COMPARISON:  None. FINDINGS: MRI HEAD FINDINGS Brain: There is a 1.2 cm area of restricted diffusion within the lateral right thalamus. No evidence of intracranial hemorrhage. Patchy and confluent areas of T2 hyperintensity in the supratentorial white matter nonspecific but probably reflect mild to moderate chronic microvascular ischemic changes. There are chronic small vessel infarcts of the left frontal white matter, left caudate, and left cerebellum. Prominence of the ventricles and sulci reflects mild generalized parenchymal volume loss. There is no intracranial mass, mass effect, or edema. There is no hydrocephalus or extra-axial fluid collection. Vascular: Major vessel flow voids at the skull base are preserved. Skull and upper cervical spine: Normal marrow signal is preserved. Sinuses/Orbits: Minor mucosal thickening.  Orbits are unremarkable. Other: Sella is unremarkable.  Mastoid air cells are clear. MRA HEAD FINDINGS Intracranial internal carotid arteries are patent. There is moderate to marked stenosis of the right paraclinoid ICA. Middle and anterior cerebral arteries are patent. 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 Intracranial vertebral arteries, basilar artery, posterior cerebral arteries are patent. Right posterior communicating artery is present with fetal origin of the right PCA. There is no significant stenosis or aneurysm. IMPRESSION: Acute right thalamic infarct. Chronic microvascular ischemic changes and chronic small vessel infarcts as described. 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 Electronically Signed   By: Guadlupe Spanish M.D.   On: 03/22/2020 13:02   MR BRAIN WO CONTRAST  Result Date: 03/22/2020 CLINICAL DATA:  Confusion EXAM: MRI HEAD WITHOUT CONTRAST MRA HEAD WITHOUT CONTRAST TECHNIQUE: Multiplanar, multiecho pulse sequences of the brain and surrounding structures were obtained without intravenous contrast. Angiographic images of the head were obtained using MRA technique without contrast. COMPARISON:  None. FINDINGS: MRI HEAD FINDINGS Brain: There is a 1.2 cm area of restricted diffusion within the lateral right thalamus. No evidence of intracranial hemorrhage. Patchy and confluent areas of T2 hyperintensity in the supratentorial white matter nonspecific but probably reflect mild to moderate chronic microvascular ischemic changes. There are chronic small vessel infarcts of the left frontal white matter,  left caudate, and left cerebellum. Prominence of the ventricles and sulci reflects mild generalized parenchymal volume loss. There is no intracranial mass, mass effect, or edema. There is no hydrocephalus or extra-axial fluid collection. Vascular: Major vessel flow voids at the skull base are preserved. Skull and upper cervical spine: Normal marrow signal is preserved. Sinuses/Orbits: Minor mucosal thickening.  Orbits are unremarkable. Other: Sella is unremarkable.  Mastoid air cells are clear. MRA HEAD FINDINGS Intracranial internal carotid arteries are patent. There is moderate to marked stenosis of the right paraclinoid ICA. Middle and  anterior cerebral arteries are patent. 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 Intracranial vertebral arteries, basilar artery, posterior cerebral arteries are patent. Right posterior communicating artery is present with fetal origin of the right PCA. There is no significant stenosis or aneurysm. IMPRESSION: Acute right thalamic infarct. Chronic microvascular ischemic changes and chronic small vessel infarcts as described. 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 Electronically Signed   By: Guadlupe Spanish M.D.   On: 03/22/2020 13:02   US Carotid Bilateral (at [facility] and [facility] only)  Result Date: 03/23/2020 CLINICAL DATA:  Altered mental status. Left hand numbness. Acute right thalamic infarct based on MRI. EXAM: BILATERAL CAROTID DUPLEX ULTRASOUND TECHNIQUE: Wallace Cullens scale imaging, color Doppler and duplex ultrasound were performed of bilateral carotid and vertebral arteries in the neck. COMPARISON:  None FINDINGS: Criteria: Quantification of carotid stenosis is based on velocity parameters that correlate the residual internal carotid diameter with NASCET-based stenosis levels, using the diameter of the distal internal carotid lumen as the denominator for stenosis measurement. The following velocity measurements were obtained: RIGHT ICA: 76/16 cm/sec CCA: 76/12 cm/sec SYSTOLIC ICA/CCA RATIO:  1.0 ECA: 84 cm/sec LEFT ICA: 90/24 cm/sec CCA: 96/17 cm/sec SYSTOLIC ICA/CCA RATIO:  0.9 ECA: 111 cm/sec RIGHT CAROTID ARTERY: Small amount of circumferential plaque at the right carotid bulb. External carotid artery is patent with normal waveform. Heterogeneous and echogenic plaque in the proximal internal carotid artery. Normal waveforms and velocities in the internal carotid artery. RIGHT VERTEBRAL ARTERY: Antegrade flow and normal waveform in the right vertebral artery. LEFT CAROTID ARTERY: Small amount of plaque at the left carotid bulb and origin of the internal and external carotid arteries.  External carotid artery is patent with normal waveform. Combination of heterogeneous and echogenic plaque in the proximal internal carotid artery. Normal waveforms and velocities in the internal carotid artery. LEFT VERTEBRAL ARTERY: Antegrade flow and normal waveform in the left vertebral artery. IMPRESSION: Atherosclerotic plaque at the carotid bulbs and proximal internal carotid arteries bilaterally. Estimated degree of stenosis in the internal carotid arteries is less than 50% bilaterally. Patent vertebral arteries with antegrade flow. Electronically Signed   By: Richarda Overlie M.D.   On: 03/23/2020 07:54   DG Chest Port 1 View  Result Date: 03/22/2020 CLINICAL DATA:  86 year old male with history of trauma from a fall yesterday complaining of left anterior chest pain. EXAM: PORTABLE CHEST 1 VIEW COMPARISON:  Chest x-ray 01/30/2019. FINDINGS: Lung volumes are normal. No consolidative airspace disease. No pleural effusions. No pneumothorax. No pulmonary nodule or mass noted. Pulmonary vasculature and the cardiomediastinal silhouette are within normal limits. Atherosclerosis in the thoracic aorta. No definite acute displaced left-sided rib fractures are noted. IMPRESSION: 1. No acute displaced left-sided rib fractures and no radiographic evidence of acute cardiopulmonary disease. 2. Aortic atherosclerosis. Electronically Signed   By: Trudie Reed M.D.   On: 03/22/2020 10:57    Catarina Hartshorn, DO  [facility]  If 7PM-7AM, please contact night-coverage [email or web address] Password [email or web address] 03/23/2020, 8:14 AM   LOS: 1 day

## 2020-03-23 NOTE — Progress Notes (Signed)
Inpatient Rehabilitation Admissions Coordinator  Inpatient rehab consult received. I spoke with patient by phone but due to his hearing difficulties, he requested I speak with his wife. I contacted his wife by phone and discussed goals and expectations of an inpt rehab admit. She prefers CIR prior to d/c home. Pt is a great candidate for CIR pending bed availability when pt medical workup complete. I have notified Dr. Arbutus Leas and Gerhard Munch, SW of recommendations. I will follow up tomorrow.Please call me with any questions.  Ottie Glazier, RN, MSN Rehab Admissions Coordinator [phone] 03/23/2020 3:45 PM

## 2020-03-23 NOTE — Evaluation (Addendum)
Occupational Therapy Evaluation Patient Details Name: Martin Frazier. MRN: [id number] DOB: 12/26/1934 Today's Date: 03/23/2020    History of Present Illness Martin Frazier. is a 86 y.o. male with a past medical history significant for gastroesophageal reflux disease, type 2 diabetes mellitus, hypertension, chronic kidney disease a stage IIIb and BPH; who presented to the hospital after experiencing for the last 48-72 hours intermittent episode of confusion/foggy mental status along with left side weakness, numbness and off-balance sensation.  Family expressed that due to his of balance patient and then the following and landed on his left side on 03/21/2020.  There has not been any fever, cough, chest pain, nausea, vomiting, hematuria, melena, hematochezia, headaches, blurred vision or contact with anyone experiencing URI symptoms. In the ED CT head was negative for acute intracranial normalities; MRI positive for acute right thalamic infarct.   Clinical Impression   Pt agreeable to OT evaluation this am. Pt reports continued numbness/tingling in left hand, light touch sensation and coordination is intact. Pt performing mobility/transfer tasks with supervision this am, able to perform seated tasks without difficulty. Pt unable to perform tasks in standing due to balance deficits and need for UE support. Recommend CIR on discharge to improve safety and independence in ADL tasks as pt was independent and active prior to CVA, motivated to return to highest level of functioning.     Follow Up Recommendations  CIR    Equipment Recommendations  None recommended by OT       Precautions / Restrictions Precautions Precautions: Fall Restrictions Weight Bearing Restrictions: No      Mobility Bed Mobility Overal bed mobility: Modified Independent                Transfers Overall transfer level: Needs assistance Equipment used: None Transfers: Sit to/from [employer] Transfers Sit to  Stand: Supervision Stand pivot transfers: Supervision                ADL either performed or assessed with clinical judgement   ADL Overall ADL's : Needs assistance/impaired     Grooming: Wash/dry face;Set up;Sitting Grooming Details (indicate cue type and reason): unable to complete in standing due to balance         Upper Body Dressing : Minimal assistance;Sitting Upper Body Dressing Details (indicate cue type and reason): assist for managing gown ties/buttones Lower Body Dressing: Supervision/safety;Sitting/lateral leans Lower Body Dressing Details (indicate cue type and reason): balance deficits preventing completion in standing Toilet Transfer: Supervision/safety;Stand-pivot Toilet Transfer Details (indicate cue type and reason): simulated with bed to chair transfer           General ADL Comments: Pt requiring increased time for safety and balance, max difficulty completing in standing due to balance deficits.      Vision Baseline Vision/History: No visual deficits Patient Visual Report: No change from baseline Vision Assessment?: No apparent visual deficits            Pertinent Vitals/Pain Pain Assessment: No/denies pain     Hand Dominance Right   Extremity/Trunk Assessment Upper Extremity Assessment Upper Extremity Assessment: RUE deficits/detail;LUE deficits/detail RUE Deficits / Details: ROM<50% and decreased strength due to past RC injury RUE Sensation: WNL RUE Coordination: WNL LUE Deficits / Details: ROM WFL, strength 4/5 throughout. Decreased grip strength LUE Sensation: WNL(reports of numbness) LUE Coordination: WNL   Lower Extremity Assessment Lower Extremity Assessment: Defer to PT evaluation   Cervical / Trunk Assessment Cervical / Trunk Assessment: Kyphotic   Communication Communication Communication:  HOH   Cognition Arousal/Alertness: Awake/alert Behavior During Therapy: WFL for tasks assessed/performed Overall Cognitive Status:  Within Functional Limits for tasks assessed                                                Home Living Family/patient expects to be discharged to:: Private residence Living Arrangements: Spouse/significant other Available Help at Discharge: Family;Available 24 hours/day Type of Home: House Home Access: Stairs to enter [employer] of Steps: 2 Entrance Stairs-Rails: None Home Layout: One level     Bathroom Shower/Tub: Producer, television/film/video: Standard     Home Equipment: Environmental consultant - 2 wheels;Cane - single point;Grab bars - tub/shower          Prior Functioning/Environment Level of Independence: Independent with assistive device(s)        Comments: Reports has been using RW for past few days, was independent in mobility prior to this. Reports independence in ADLs        OT Problem List: Decreased strength;Decreased activity tolerance;Impaired balance (sitting and/or standing);Decreased safety awareness;Decreased knowledge of use of DME or AE       End of Session Nurse Communication: Mobility status  Activity Tolerance: Patient tolerated treatment well Patient left: in chair;with call bell/phone within reach;with chair alarm set  OT Visit Diagnosis: Muscle weakness (generalized) (M62.81);Repeated falls (R29.6)                Time: 7341-9379 OT Time Calculation (min): 34 min Charges:  OT General Charges $OT Visit: 1 Visit OT Evaluation $OT Eval Low Complexity: 1 Low   Ezra Sites, OTR/L  [phone] 03/23/2020, 9:59 AM

## 2020-03-24 LAB — GLUCOSE, CAPILLARY
Glucose-Capillary: 125 mg/dL — ABNORMAL HIGH (ref 70–99)
Glucose-Capillary: 154 mg/dL — ABNORMAL HIGH (ref 70–99)
Glucose-Capillary: 136 mg/dL — ABNORMAL HIGH (ref 70–99)
Glucose-Capillary: 249 mg/dL — ABNORMAL HIGH (ref 70–99)

## 2020-03-24 NOTE — Progress Notes (Signed)
PROGRESS NOTE  Martin Frazier. YIR:[id number] DOB: August 10, 1934 DOA: 03/22/2020 PCP: Leeanne Rio, MD  Brief History:  86 year old male with a history of diabetes mellitus type 2, hypertension, CKD stage IIIb presenting with approximately 1 to 2-day history of numbness in his left hand and left leg as well as weakness in his left upper extremity.  In addition, the patient has had some gait instability resulting in a fall on the day prior to admission.  He denied syncope.  He denied any fevers, chills, chest pain, shortness breath, nausea, vomiting, diarrhea, abdominal pain.  His wife feels that the patient has had a foggy sensorium over the past 2 days prior to admission.  The patient denies any headache, visual disturbance, speech difficulty or facial droop.  In the emergency department, MRI of the brain showed an acute right thalamic infarct.  He was admitted for further evaluation and treatment.  Assessment/Plan: Acute right thalamic stroke -Neurology Consult -PT/OT evaluation>>>CIR -Speech therapy eval -CT brain--neg -MRI brain--acute right thalamic infarct -MRA brain--moderate to severe focal stenosis of the right paraclinoid ICA -Carotid Duplex--neg for hemodynamically significant stenosis -Echo--EF 60-65%, G1 DD, no PFO -LDL--87 -HbA1C--7.1 -Antiplatelet--ASA 325 mg  Essential hypertension -Holding ramipril -Allow for permissive hypertension  Diabetes mellitus type 2 -Holding metformin -03/23/2020 hemoglobin A1c 7.1 -NovoLog sliding scale  CKD stage IIIb -Baseline creatinine 5.0-0.9  Acute metabolic encephalopathy -3/81--WEXHBZ states pt near baseline -UA negative for pyuria -TSH 0.915 -Serum J69--678 -Folic LFYB--01.7 -PZWCHEN--27  BPH -Continue tamsulosin         Disposition Plan: Patient From: Home D/C Place: CIR 3/26 if stable Barriers: CIR bed availability  Family Communication:   spouse updated 3/25  Consultants:   neurology  Code Status:  FULL  DVT Prophylaxis:  D[place]gren Heparin   Procedures: As Listed in Progress Note Above  Antibiotics: None    Subjective: Pt feels that his left arm and leg strength are a little better.  Numbness is improving a little.  Denies headache, visual loss, dysarthria, dysphasia.  Objective: Vitals:   03/24/20 0208 03/24/20 0457 03/24/20 1000 03/24/20 1510  BP: (!) 145/72 116/73 140/75 120/70  Pulse: 98 93 86 90  Resp:  17 18 18   Temp:  98.6 F (37 C) 98.4 F (36.9 C) 97.8 F (36.6 C)  TempSrc:  Oral Oral Oral  SpO2:  97% 97% 96%  Weight:      Height:        Intake/Output Summary (Last 24 hours) at 03/24/2020 1746 Last data filed at 03/24/2020 1200 Gross per 24 hour  Intake 720 ml  Output --  Net 720 ml   Weight change:  Exam:   General:  Pt is alert, follows commands appropriately, not in acute distress  HEENT: No icterus, No thrush, No neck mass, [place]/AT  Cardiovascular: RRR, S1/S2, no rubs, no gallops  Respiratory: CTA bilaterally, no wheezing, no crackles, no rhonchi  Abdomen: Soft/+BS, non tender, non distended, no guarding  Extremities: No edema, No lymphangitis, No petechiae, No rashes, no synovitis  Neuro:  CN II-XII intact, strength 4/5 in RUE, RLE, strength 4/5 LUE, LLE; sensation intact bilateral; no dysmetria; babinski equivocal     Data Reviewed: I have personally reviewed following labs and imaging studies Basic Metabolic Panel: Recent Labs  Lab 03/22/20 1028 03/22/20 1031 03/23/20 0532  NA 139 139 138  K 4.4 4.2 4.5  CL 102 103 102  CO2  --  27 27  GLUCOSE 132* 135* 126*  BUN 27* 29* 26*  CREATININE 1.60* 1.49* 1.65*  CALCIUM  --  9.3 9.0   Liver Function Tests: Recent Labs  Lab 03/22/20 1031  AST 30  ALT 22  ALKPHOS 128*  BILITOT 0.8  PROT 6.8  ALBUMIN 4.1   No results for input(s): LIPASE, AMYLASE in the last 168 hours. Recent Labs  Lab 03/23/20 0749  AMMONIA 20   Coagulation Profile: Recent  Labs  Lab 03/22/20 1031  INR 1.0   CBC: Recent Labs  Lab 03/22/20 1028 03/22/20 1031 03/23/20 0532  WBC  --  7.2 6.9  NEUTROABS  --  5.1  --   HGB 13.6 13.1 13.2  HCT 40.0 40.2 40.7  MCV  --  99.5 99.3  PLT  --  162 174   Cardiac Enzymes: No results for input(s): CKTOTAL, CKMB, CKMBINDEX, TROPONINI in the last 168 hours. BNP: Invalid input(s): POCBNP CBG: Recent Labs  Lab 03/23/20 1625 03/23/20 2003 03/24/20 0754 03/24/20 1141 03/24/20 1713  GLUCAP 144* 179* 125* 154* 136*   HbA1C: Recent Labs    03/23/20 0532  HGBA1C 7.1*   Urine analysis:    Component Value Date/Time   COLORURINE YELLOW 03/22/2020 1138   APPEARANCEUR CLEAR 03/22/2020 1138   LABSPEC 1.016 03/22/2020 1138   PHURINE 7.0 03/22/2020 1138   GLUCOSEU 150 (A) 03/22/2020 1138   HGBUR NEGATIVE 03/22/2020 1138   BILIRUBINUR NEGATIVE 03/22/2020 1138   KETONESUR NEGATIVE 03/22/2020 1138   PROTEINUR 30 (A) 03/22/2020 1138   NITRITE NEGATIVE 03/22/2020 1138   LEUKOCYTESUR NEGATIVE 03/22/2020 1138   Sepsis Labs: @LABRCNTIP (procalcitonin:4,lacticidven:4) ) Recent Results (from the past 240 hour(s))  SARS CORONAVIRUS 2 (Deneise Getty 6-24 HRS) Nasopharyngeal Nasopharyngeal Swab     Status: None   Collection Time: 03/22/20  2:40 PM   Specimen: Nasopharyngeal Swab  Result Value Ref Range Status   SARS Coronavirus 2 NEGATIVE NEGATIVE Final    Comment: (NOTE) SARS-CoV-2 target nucleic acids are NOT DETECTED. The SARS-CoV-2 RNA is generally detectable in upper and lower respiratory specimens during the acute phase of infection. Negative results do not preclude SARS-CoV-2 infection, do not rule out co-infections with other pathogens, and should not be used as the sole basis for treatment or other patient management decisions. Negative results must be combined with clinical observations, patient history, and epidemiological information. The expected result is Negative. Fact Sheet for  Patients: 03/24/20 Fact Sheet for Healthcare Providers: HairSlick.no This test is not yet approved or cleared by the quierodirigir.com FDA and  has been authorized for detection and/or diagnosis of SARS-CoV-2 by FDA under an Emergency Use Authorization (EUA). This EUA will remain  in effect (meaning this test can be used) for the duration of the COVID-19 declaration under Section 56 4(b)(1) of the Act, 21 U.S.C. section 360bbb-3(b)(1), unless the authorization is terminated or revoked sooner. Performed at [facility], [address], [place], [place]      Scheduled Meds: . aspirin  300 mg Rectal Daily   Or  . aspirin  325 mg Oral Daily  . atorvastatin  10 mg Oral q1800  . heparin injection (subcutaneous)  5,000 Units Subcutaneous Q8H  . insulin aspart  0-5 Units Subcutaneous QHS  . insulin aspart  0-9 Units Subcutaneous TID WC  . pantoprazole  40 mg Oral Daily  . tamsulosin  0.4 mg Oral Daily   Continuous Infusions:  Procedures/Studies: CT HEAD WO CONTRAST  Result Date: 03/22/2020 CLINICAL DATA:  Confusion  EXAM: CT HEAD WITHOUT CONTRAST TECHNIQUE: Contiguous axial images were obtained from the base of the skull through the vertex without intravenous contrast. COMPARISON:  None. FINDINGS: Brain: No evidence of acute infarction, hemorrhage, hydrocephalus, extra-axial collection or mass lesion/mass effect. Focal encephalomalacia from lacunar infarct in the right thalamus. Additional small lacunar infarcts in the left corona radiata. Mild-moderate low-density changes within the periventricular and subcortical white matter compatible with chronic microvascular ischemic change. Mild diffuse cerebral volume loss. Vascular: Atherosclerotic calcifications involving the large vessels of the skull base. No unexpected hyperdense vessel. Skull: Normal. Negative for fracture or focal lesion. Sinuses/Orbits: No acute finding.  Other: None. IMPRESSION: 1. No acute intracranial findings. 2. Chronic microvascular ischemic changes and cerebral volume loss. 3. Focal encephalomalacia from remote lacunar infarct in the right thalamus. Additional small remote lacunar infarcts in the left corona radiata. Electronically Signed   By: Duanne Guess D.O.   On: 03/22/2020 11:07   MR ANGIO HEAD WO CONTRAST  Result Date: 03/22/2020 CLINICAL DATA:  Confusion EXAM: MRI HEAD WITHOUT CONTRAST MRA HEAD WITHOUT CONTRAST TECHNIQUE: Multiplanar, multiecho pulse sequences of the brain and surrounding structures were obtained without intravenous contrast. Angiographic images of the head were obtained using MRA technique without contrast. COMPARISON:  None. FINDINGS: MRI HEAD FINDINGS Brain: There is a 1.2 cm area of restricted diffusion within the lateral right thalamus. No evidence of intracranial hemorrhage. Patchy and confluent areas of T2 hyperintensity in the supratentorial white matter nonspecific but probably reflect mild to moderate chronic microvascular ischemic changes. There are chronic small vessel infarcts of the left frontal white matter, left caudate, and left cerebellum. Prominence of the ventricles and sulci reflects mild generalized parenchymal volume loss. There is no intracranial mass, mass effect, or edema. There is no hydrocephalus or extra-axial fluid collection. Vascular: Major vessel flow voids at the skull base are preserved. Skull and upper cervical spine: Normal marrow signal is preserved. Sinuses/Orbits: Minor mucosal thickening.  Orbits are unremarkable. Other: Sella is unremarkable.  Mastoid air cells are clear. MRA HEAD FINDINGS Intracranial internal carotid arteries are patent. There is moderate to marked stenosis of the right paraclinoid ICA. Middle and anterior cerebral arteries are patent. 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 Intracranial vertebral arteries, basilar artery, posterior cerebral arteries are  patent. Right posterior communicating artery is present with fetal origin of the right PCA. There is no significant stenosis or aneurysm. IMPRESSION: Acute right thalamic infarct. Chronic microvascular ischemic changes and chronic small vessel infarcts as described. 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 Electronically Signed   By: Guadlupe Spanish M.D.   On: 03/22/2020 13:02   MR BRAIN WO CONTRAST  Result Date: 03/22/2020 CLINICAL DATA:  Confusion EXAM: MRI HEAD WITHOUT CONTRAST MRA HEAD WITHOUT CONTRAST TECHNIQUE: Multiplanar, multiecho pulse sequences of the brain and surrounding structures were obtained without intravenous contrast. Angiographic images of the head were obtained using MRA technique without contrast. COMPARISON:  None. FINDINGS: MRI HEAD FINDINGS Brain: There is a 1.2 cm area of restricted diffusion within the lateral right thalamus. No evidence of intracranial hemorrhage. Patchy and confluent areas of T2 hyperintensity in the supratentorial white matter nonspecific but probably reflect mild to moderate chronic microvascular ischemic changes. There are chronic small vessel infarcts of the left frontal white matter, left caudate, and left cerebellum. Prominence of the ventricles and sulci reflects mild generalized parenchymal volume loss. There is no intracranial mass, mass effect, or edema. There is no hydrocephalus or extra-axial fluid collection.  Vascular: Major vessel flow voids at the skull base are preserved. Skull and upper cervical spine: Normal marrow signal is preserved. Sinuses/Orbits: Minor mucosal thickening.  Orbits are unremarkable. Other: Sella is unremarkable.  Mastoid air cells are clear. MRA HEAD FINDINGS Intracranial internal carotid arteries are patent. There is moderate to marked stenosis of the right paraclinoid ICA. Middle and anterior cerebral arteries are patent. 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 Intracranial vertebral arteries,  basilar artery, posterior cerebral arteries are patent. Right posterior communicating artery is present with fetal origin of the right PCA. There is no significant stenosis or aneurysm. IMPRESSION: Acute right thalamic infarct. Chronic microvascular ischemic changes and chronic small vessel infarcts as described. 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 Electronically Signed   By: Guadlupe Spanish M.D.   On: 03/22/2020 13:02   US Carotid Bilateral (at [facility] and [facility] only)  Result Date: 03/23/2020 CLINICAL DATA:  Altered mental status. Left hand numbness. Acute right thalamic infarct based on MRI. EXAM: BILATERAL CAROTID DUPLEX ULTRASOUND TECHNIQUE: Wallace Cullens scale imaging, color Doppler and duplex ultrasound were performed of bilateral carotid and vertebral arteries in the neck. COMPARISON:  None FINDINGS: Criteria: Quantification of carotid stenosis is based on velocity parameters that correlate the residual internal carotid diameter with NASCET-based stenosis levels, using the diameter of the distal internal carotid lumen as the denominator for stenosis measurement. The following velocity measurements were obtained: RIGHT ICA: 76/16 cm/sec CCA: 76/12 cm/sec SYSTOLIC ICA/CCA RATIO:  1.0 ECA: 84 cm/sec LEFT ICA: 90/24 cm/sec CCA: 96/17 cm/sec SYSTOLIC ICA/CCA RATIO:  0.9 ECA: 111 cm/sec RIGHT CAROTID ARTERY: Small amount of circumferential plaque at the right carotid bulb. External carotid artery is patent with normal waveform. Heterogeneous and echogenic plaque in the proximal internal carotid artery. Normal waveforms and velocities in the internal carotid artery. RIGHT VERTEBRAL ARTERY: Antegrade flow and normal waveform in the right vertebral artery. LEFT CAROTID ARTERY: Small amount of plaque at the left carotid bulb and origin of the internal and external carotid arteries. External carotid artery is patent with normal waveform. Combination of heterogeneous and echogenic plaque in the proximal  internal carotid artery. Normal waveforms and velocities in the internal carotid artery. LEFT VERTEBRAL ARTERY: Antegrade flow and normal waveform in the left vertebral artery. IMPRESSION: Atherosclerotic plaque at the carotid bulbs and proximal internal carotid arteries bilaterally. Estimated degree of stenosis in the internal carotid arteries is less than 50% bilaterally. Patent vertebral arteries with antegrade flow. Electronically Signed   By: Richarda Overlie M.D.   On: 03/23/2020 07:54   DG Chest Port 1 View  Result Date: 03/22/2020 CLINICAL DATA:  86 year old male with history of trauma from a fall yesterday complaining of left anterior chest pain. EXAM: PORTABLE CHEST 1 VIEW COMPARISON:  Chest x-ray 01/30/2019. FINDINGS: Lung volumes are normal. No consolidative airspace disease. No pleural effusions. No pneumothorax. No pulmonary nodule or mass noted. Pulmonary vasculature and the cardiomediastinal silhouette are within normal limits. Atherosclerosis in the thoracic aorta. No definite acute displaced left-sided rib fractures are noted. IMPRESSION: 1. No acute displaced left-sided rib fractures and no radiographic evidence of acute cardiopulmonary disease. 2. Aortic atherosclerosis. Electronically Signed   By: Trudie Reed M.D.   On: 03/22/2020 10:57   ECHOCARDIOGRAM COMPLETE  Result Date: 03/23/2020    ECHOCARDIOGRAM REPORT   Patient Name:   Carlon Chaloux. Date of Exam: 03/23/2020 Medical Rec #:  [id number]       Height:  68.0 in Accession #:    [id number]      Weight:       130.0 lb Date of Birth:  1934-11-16       BSA:          1.702 m Patient Age:    85 years        BP:           114/63 mmHg Patient Gender: M               HR:           89 bpm. [facility] Procedure: 2D Echo, Cardiac Doppler and Color Doppler Indications:    Stroke 434.91 / I163.9  History:        Patient has no prior history of Echocardiogram examinations.                 Risk Factors:Diabetes and Hypertension.  GERD.  Sonographer:    Celesta Gentile RCS Referring Phys: [phone] CARLOS MADERA IMPRESSIONS  1. Left ventricular ejection fraction, by estimation, is 60 to 65%. The left ventricle has normal function. The left ventricle has no regional wall motion abnormalities. Left ventricular diastolic parameters are consistent with Grade I diastolic dysfunction (impaired relaxation).  2. Right ventricular systolic function is normal. The right ventricular size is normal. There is normal pulmonary artery systolic pressure.  3. The mitral valve is normal in structure. Trivial mitral valve regurgitation. No evidence of mitral stenosis.  4. The aortic valve is tricuspid. Aortic valve regurgitation is mild. No aortic stenosis is present.  5. The inferior vena cava is normal in size with greater than 50% respiratory variability, suggesting right atrial pressure of 3 mmHg. FINDINGS  Left Ventricle: Left ventricular ejection fraction, by estimation, is 60 to 65%. The left ventricle has normal function. The left ventricle has no regional wall motion abnormalities. The left ventricular internal cavity size was normal in size. There is  no left ventricular hypertrophy. Left ventricular diastolic parameters are consistent with Grade I diastolic dysfunction (impaired relaxation). Normal left ventricular filling pressure. Right Ventricle: The right ventricular size is normal. No increase in right ventricular wall thickness. Right ventricular systolic function is normal. There is normal pulmonary artery systolic pressure. The tricuspid regurgitant velocity is 2.37 m/s, and  with an assumed right atrial pressure of 3 mmHg, the estimated right ventricular systolic pressure is 25.5 mmHg. Left Atrium: Left atrial size was normal in size. Right Atrium: Right atrial size was normal in size. Pericardium: There is no evidence of pericardial effusion. Mitral Valve: The mitral valve is normal in structure. Trivial mitral valve regurgitation. No evidence of  mitral valve stenosis. Tricuspid Valve: The tricuspid valve is normal in structure. Tricuspid valve regurgitation is mild . No evidence of tricuspid stenosis. Aortic Valve: The aortic valve is tricuspid. Aortic valve regurgitation is mild. No aortic stenosis is present. Aortic valve mean gradient measures 2.3 mmHg. Aortic valve peak gradient measures 4.5 mmHg. Aortic valve area, by VTI measures 2.79 cm. Pulmonic Valve: The pulmonic valve was not well visualized. Pulmonic valve regurgitation is not visualized. No evidence of pulmonic stenosis. Aorta: The aortic root is normal in size and structure. Venous: The inferior vena cava is normal in size with greater than 50% respiratory variability, suggesting right atrial pressure of 3 mmHg. IAS/Shunts: No atrial level shunt detected by color flow Doppler.  LEFT VENTRICLE PLAX 2D LVIDd:         3.21 cm  Diastology LVIDs:         2.40 cm     LV e' lateral:   7.94 cm/s LV PW:         0.93 cm     LV E/e' lateral: 5.5 LV IVS:        0.90 cm     LV e' medial:    4.35 cm/s LVOT diam:     2.00 cm     LV E/e' medial:  10.0 LV SV:         51 LV SV Index:   30 LVOT Area:     3.14 cm  LV Volumes (MOD) LV vol d, MOD A2C: 40.1 ml LV vol d, MOD A4C: 51.1 ml LV vol s, MOD A2C: 17.1 ml LV vol s, MOD A4C: 24.3 ml LV SV MOD A2C:     23.0 ml LV SV MOD A4C:     51.1 ml LV SV MOD BP:      23.4 ml RIGHT VENTRICLE RV Mid diam:    2.61 cm RV S prime:     10.00 cm/s TAPSE (M-mode): 1.4 cm LEFT ATRIUM             Index LA diam:        3.80 cm 2.23 cm/m LA Vol (A2C):   41.9 ml 24.62 ml/m LA Vol (A4C):   34.1 ml 20.04 ml/m LA Biplane Vol: 38.6 ml 22.68 ml/m  AORTIC VALVE AV Area (Vmax):    2.64 cm AV Area (Vmean):   2.95 cm AV Area (VTI):     2.79 cm AV Vmax:           106.19 cm/s AV Vmean:          70.969 cm/s AV VTI:            0.182 m AV Peak Grad:      4.5 mmHg AV Mean Grad:      2.3 mmHg LVOT Vmax:         89.40 cm/s LVOT Vmean:        66.700 cm/s LVOT VTI:          0.162 m LVOT/AV  VTI ratio: 0.89  AORTA Ao Root diam: 3.00 cm MITRAL VALVE                TRICUSPID VALVE MV Area (PHT): 3.30 cm     TR Peak grad:   22.5 mmHg MV Decel Time: 230 msec     TR Vmax:        237.00 cm/s MV E velocity: 43.70 cm/s MV A velocity: 104.00 cm/s  SHUNTS MV E/A ratio:  0.42         Systemic VTI:  0.16 m                             Systemic Diam: 2.00 cm Dina Rich MD Electronically signed by Dina Rich MD Signature Date/Time: 03/23/2020/3:25:06 PM    Final     Catarina Hartshorn, DO  [facility]  If 7PM-7AM, please contact night-coverage [email or web address] Password [facility] 03/24/2020, 5:46 PM   LOS: 2 days

## 2020-03-24 NOTE — Progress Notes (Signed)
Inpatient Rehabilitation Admissions Coordinator  CIR bed is available to admit patient to tomorrow. I spoke with both patient and with his wife by phone and they are in agreement to admit to semiprivate room tomorrow. I have notified Dr. Arbutus Leas as well as Herbert Seta SW. I would like to arrange pickup for 11 am tomorrow. I will follow up in the morning to make the arrangements.  Ottie Glazier, RN, MSN Rehab Admissions Coordinator [phone] 03/24/2020 4:23 PM

## 2020-03-24 NOTE — Progress Notes (Signed)
Physical Therapy Treatment Patient Details Name: Martin Frazier. MRN: [id number] DOB: Aug 12, 1934 Today's Date: 03/24/2020    History of Present Illness Martin Frazier. is a 86 y.o. male with a past medical history significant for gastroesophageal reflux disease, type 2 diabetes mellitus, hypertension, chronic kidney disease a stage IIIb and BPH; who presented to the hospital after experiencing for the last 48-72 hours intermittent episode of confusion/foggy mental status along with left side weakness, numbness and off-balance sensation.  Family expressed that due to his of balance patient and then the following and landed on his left side on 03/21/2020.  There has not been any fever, cough, chest pain, nausea, vomiting, hematuria, melena, hematochezia, headaches, blurred vision or contact with anyone experiencing URI symptoms. In the ED CT head was negative for acute intracranial normalities; MRI positive for acute right thalamic infarct.    PT Comments    Patient has difficulty sitting up at bedside due to fair/poor coordination of LUE, tends to lean backwards when completing BLE ROM/strengthening exercises while seated at bedside requiring verbal/tactile cueing to keep trunk in midline, demonstrates increased endurance/distance for taking steps with slow labored cadence and mild difficulty advancing LLE due to limited ankle dorsiflexion and weakness.  Patient tolerated sitting up in chair after therapy - nursing staff notified.  Patient will benefit from continued physical therapy in hospital and recommended venue below to increase strength, balance, endurance for safe ADLs and gait.    Follow Up Recommendations  CIR     Equipment Recommendations  None recommended by PT    Recommendations for Other Services       Precautions / Restrictions Precautions Precautions: Fall Restrictions Weight Bearing Restrictions: No    Mobility  Bed Mobility   Bed Mobility: Supine to Sit     Supine  to sit: Min guard;Min assist     General bed mobility comments: slow labored movement, limited use of LUE due decreased coordination  Transfers Overall transfer level: Needs assistance Equipment used: Rolling walker (2 wheeled) Transfers: Sit to/from [facility] Sit to Stand: Min guard Stand pivot transfers: Min guard;Supervision       General transfer comment: unsteady on feet, labored movement, requires verbal cues for proper hand placement during stand to sitting  Ambulation/Gait Ambulation/Gait assistance: Min assist Gait Distance (Feet): 65 Feet Assistive device: Rolling walker (2 wheeled) Gait Pattern/deviations: Decreased step length - left;Decreased stance time - left;Decreased stride length;Trunk flexed Gait velocity: decreased   General Gait Details: increased endurance/distance for taking steps, mild difficulty advancing LLE with limited ankle dorsiflexion, increased time for making turns, limited secondary to c/o fatigue   Stairs             Wheelchair Mobility    Modified Rankin (Stroke Patients Only)       Balance Overall balance assessment: Needs assistance Sitting-balance support: Feet supported;No upper extremity supported Sitting balance-Leahy Scale: Good Sitting balance - Comments: seated at EOB, good static, fair dynamic Postural control: Posterior lean(when completing exercises) Standing balance support: During functional activity;Bilateral upper extremity supported Standing balance-Leahy Scale: Fair Standing balance comment: using RW                            Cognition Arousal/Alertness: Awake/alert Behavior During Therapy: WFL for tasks assessed/performed Overall Cognitive Status: Within Functional Limits for tasks assessed  Exercises General Exercises - Lower Extremity Short Arc Quad: Seated;AROM;Strengthening;Both;15 reps Hip Flexion/Marching:  Seated;AROM;Strengthening;Both;15 reps Toe Raises: Seated;AROM;Strengthening;Both;15 reps Heel Raises: Seated;AROM;Strengthening;Both;15 reps    General Comments        Pertinent Vitals/Pain Pain Assessment: Faces Faces Pain Scale: Hurts little more Pain Location: left side of chest wall due to fall at home Pain Descriptors / Indicators: Sore;Discomfort Pain Intervention(s): Limited activity within patient's tolerance;Monitored during session;Repositioned    Home Living                      Prior Function            PT Goals (current goals can now be found in the care plan section) Acute Rehab PT Goals Patient Stated Goal: to get stronger and go home PT Goal Formulation: With patient/family Time For Goal Achievement: 04/06/20 Potential to Achieve Goals: Good Progress towards PT goals: Progressing toward goals    Frequency    7X/week      PT Plan Current plan remains appropriate    Co-evaluation              AM-PAC PT "6 Clicks" Mobility   Outcome Measure  Help needed turning from your back to your side while in a flat bed without using bedrails?: None Help needed moving from lying on your back to sitting on the side of a flat bed without using bedrails?: A Little Help needed moving to and from a bed to a chair (including a wheelchair)?: A Little Help needed standing up from a chair using your arms (e.g., wheelchair or bedside chair)?: A Little Help needed to walk in hospital room?: A Lot Help needed climbing 3-5 steps with a railing? : A Lot 6 Click Score: 17    End of Session   Activity Tolerance: Patient tolerated treatment well Patient left: in chair;with call bell/phone within reach Nurse Communication: Mobility status PT Visit Diagnosis: Unsteadiness on feet (R26.81);Other abnormalities of gait and mobility (R26.89);Muscle weakness (generalized) (M62.81)     Time: 8242-3536 PT Time Calculation (min) (ACUTE ONLY): 27 min  Charges:   $Gait Training: 8-22 mins $Therapeutic Exercise: 8-22 mins                     4:33 PM, 03/24/20 Ocie Bob, MPT Physical Therapist with [facility] [phone] office [phone] mobile phone

## 2020-03-25 ENCOUNTER — Other Ambulatory Visit: Payer: Self-pay

## 2020-03-25 ENCOUNTER — Encounter (HOSPITAL_COMMUNITY): Payer: Self-pay | Admitting: Physical Medicine & Rehabilitation

## 2020-03-25 ENCOUNTER — Inpatient Hospital Stay (HOSPITAL_COMMUNITY)
Admission: RE | Admit: 2020-03-25 | Discharge: 2020-04-06 | DRG: 057 | Disposition: A | Discharge status: Home Health | Payer: Medicare Other | Source: Other Acute Inpatient Hospital | Attending: Physical Medicine & Rehabilitation | Admitting: Physical Medicine & Rehabilitation

## 2020-03-25 DIAGNOSIS — N183 Chronic kidney disease, stage 3 unspecified: Secondary | ICD-10-CM | POA: Diagnosis present

## 2020-03-25 DIAGNOSIS — I639 Cerebral infarction, unspecified: Secondary | ICD-10-CM | POA: Diagnosis not present

## 2020-03-25 DIAGNOSIS — I129 Hypertensive chronic kidney disease with stage 1 through stage 4 chronic kidney disease, or unspecified chronic kidney disease: Secondary | ICD-10-CM | POA: Diagnosis present

## 2020-03-25 DIAGNOSIS — N400 Enlarged prostate without lower urinary tract symptoms: Secondary | ICD-10-CM | POA: Diagnosis present

## 2020-03-25 DIAGNOSIS — I10 Essential (primary) hypertension: Secondary | ICD-10-CM

## 2020-03-25 DIAGNOSIS — E1165 Type 2 diabetes mellitus with hyperglycemia: Secondary | ICD-10-CM | POA: Diagnosis present

## 2020-03-25 DIAGNOSIS — E785 Hyperlipidemia, unspecified: Secondary | ICD-10-CM | POA: Diagnosis present

## 2020-03-25 DIAGNOSIS — Z882 Allergy status to sulfonamides status: Secondary | ICD-10-CM

## 2020-03-25 DIAGNOSIS — H5712 Ocular pain, left eye: Secondary | ICD-10-CM | POA: Diagnosis present

## 2020-03-25 DIAGNOSIS — Z87891 Personal history of nicotine dependence: Secondary | ICD-10-CM

## 2020-03-25 DIAGNOSIS — Z8679 Personal history of other diseases of the circulatory system: Secondary | ICD-10-CM

## 2020-03-25 DIAGNOSIS — Z7984 Long term (current) use of oral hypoglycemic drugs: Secondary | ICD-10-CM

## 2020-03-25 DIAGNOSIS — H9190 Unspecified hearing loss, unspecified ear: Secondary | ICD-10-CM | POA: Diagnosis present

## 2020-03-25 DIAGNOSIS — N179 Acute kidney failure, unspecified: Secondary | ICD-10-CM | POA: Diagnosis present

## 2020-03-25 DIAGNOSIS — I69354 Hemiplegia and hemiparesis following cerebral infarction affecting left non-dominant side: Secondary | ICD-10-CM | POA: Diagnosis present

## 2020-03-25 DIAGNOSIS — E1122 Type 2 diabetes mellitus with diabetic chronic kidney disease: Secondary | ICD-10-CM | POA: Diagnosis present

## 2020-03-25 DIAGNOSIS — K5901 Slow transit constipation: Secondary | ICD-10-CM | POA: Diagnosis present

## 2020-03-25 DIAGNOSIS — Z79899 Other long term (current) drug therapy: Secondary | ICD-10-CM | POA: Diagnosis not present

## 2020-03-25 DIAGNOSIS — H5710 Ocular pain, unspecified eye: Secondary | ICD-10-CM

## 2020-03-25 DIAGNOSIS — E119 Type 2 diabetes mellitus without complications: Secondary | ICD-10-CM

## 2020-03-25 DIAGNOSIS — I6381 Other cerebral infarction due to occlusion or stenosis of small artery: Secondary | ICD-10-CM | POA: Diagnosis present

## 2020-03-25 LAB — CBC
HCT: 41 % (ref 39.0–52.0)
Hemoglobin: 13.8 g/dL (ref 13.0–17.0)
MCH: 32.3 pg (ref 26.0–34.0)
MCHC: 33.7 g/dL (ref 30.0–36.0)
MCV: 96 fL (ref 80.0–100.0)
Platelets: 185 10*3/uL (ref 150–400)
RBC: 4.27 MIL/uL (ref 4.22–5.81)
RDW: 12.5 % (ref 11.5–15.5)
WBC: 7.3 10*3/uL (ref 4.0–10.5)
nRBC: 0 % (ref 0.0–0.2)

## 2020-03-25 LAB — CREATININE, SERUM
Creatinine, Ser: 1.82 mg/dL — ABNORMAL HIGH (ref 0.61–1.24)
GFR calc Af Amer: 39 mL/min — ABNORMAL LOW (ref >60)
GFR calc non Af Amer: 34 mL/min — ABNORMAL LOW (ref >60)

## 2020-03-25 LAB — GLUCOSE, CAPILLARY
Glucose-Capillary: 119 mg/dL — ABNORMAL HIGH (ref 70–99)
Glucose-Capillary: 184 mg/dL — ABNORMAL HIGH (ref 70–99)
Glucose-Capillary: 121 mg/dL — ABNORMAL HIGH (ref 70–99)
Glucose-Capillary: 159 mg/dL — ABNORMAL HIGH (ref 70–99)

## 2020-03-25 MED ORDER — ASPIRIN 300 MG RE SUPP
300.0000 mg | Freq: Every day | RECTAL | Status: DC
  Filled 2020-03-25 (×3): qty 1

## 2020-03-25 MED ORDER — HEPARIN SODIUM (PORCINE) 5000 UNIT/ML IJ SOLN
5000.0000 [IU] | Freq: Three times a day (TID) | INTRAMUSCULAR | Status: DC
  Administered 2020-03-25 – 2020-04-06 (×36): 5000 [IU] via SUBCUTANEOUS
  Filled 2020-03-25 (×36): qty 1

## 2020-03-25 MED ORDER — HEPARIN SODIUM (PORCINE) 5000 UNIT/ML IJ SOLN: 5000.0000 [IU] | Freq: Three times a day (TID) | INTRAMUSCULAR | Status: DC

## 2020-03-25 MED ORDER — ASPIRIN 325 MG PO TABS
325.0000 mg | ORAL_TABLET | Freq: Every day | ORAL | Status: DC
  Administered 2020-03-26 – 2020-04-06 (×12): 325 mg via ORAL
  Filled 2020-03-25 (×12): qty 1

## 2020-03-25 MED ORDER — ENSURE ENLIVE PO LIQD
237.0000 mL | Freq: Two times a day (BID) | ORAL | Status: DC
  Administered 2020-03-27 – 2020-04-04 (×3): 237 mL via ORAL

## 2020-03-25 MED ORDER — TAMSULOSIN HCL 0.4 MG PO CAPS
0.4000 mg | ORAL_CAPSULE | Freq: Every day | ORAL | Status: DC
  Administered 2020-03-26 – 2020-04-06 (×12): 0.4 mg via ORAL
  Filled 2020-03-25 (×12): qty 1

## 2020-03-25 MED ORDER — ATORVASTATIN CALCIUM 10 MG PO TABS
10.0000 mg | ORAL_TABLET | Freq: Every day | ORAL | Status: DC
  Administered 2020-03-25 – 2020-04-05 (×12): 10 mg via ORAL
  Filled 2020-03-25 (×12): qty 1

## 2020-03-25 MED ORDER — SORBITOL 70 % SOLN
30.0000 mL | Freq: Every day | Status: DC | PRN
  Administered 2020-03-27 – 2020-04-02 (×3): 30 mL via ORAL
  Filled 2020-03-25 (×3): qty 30

## 2020-03-25 MED ORDER — ACETAMINOPHEN 325 MG PO TABS
650.0000 mg | ORAL_TABLET | ORAL | Status: DC | PRN
  Administered 2020-03-27 – 2020-04-04 (×2): 650 mg via ORAL
  Filled 2020-03-25 (×4): qty 2

## 2020-03-25 MED ORDER — ASPIRIN 325 MG PO TABS: 325.0000 mg | ORAL_TABLET | Freq: Every day | ORAL | Status: DC

## 2020-03-25 MED ORDER — ACETAMINOPHEN 160 MG/5ML PO SOLN
650.0000 mg | ORAL | Status: DC | PRN
  Filled 2020-03-25: qty 20.3

## 2020-03-25 MED ORDER — INSULIN ASPART 100 UNIT/ML ~~LOC~~ SOLN
0.0000 [IU] | Freq: Three times a day (TID) | SUBCUTANEOUS | Status: DC
  Administered 2020-03-25: 2 [IU] via SUBCUTANEOUS
  Administered 2020-03-25: 1 [IU] via SUBCUTANEOUS
  Administered 2020-03-26: 3 [IU] via SUBCUTANEOUS
  Administered 2020-03-26: 2 [IU] via SUBCUTANEOUS
  Administered 2020-03-26: 1 [IU] via SUBCUTANEOUS
  Administered 2020-03-27 – 2020-03-28 (×4): 2 [IU] via SUBCUTANEOUS
  Administered 2020-03-28 (×2): 1 [IU] via SUBCUTANEOUS
  Administered 2020-03-29 – 2020-03-30 (×4): 2 [IU] via SUBCUTANEOUS
  Administered 2020-03-31 – 2020-04-02 (×4): 1 [IU] via SUBCUTANEOUS
  Administered 2020-04-04 – 2020-04-05 (×2): 2 [IU] via SUBCUTANEOUS

## 2020-03-25 MED ORDER — ACETAMINOPHEN 650 MG RE SUPP: 650.0000 mg | RECTAL | Status: DC | PRN

## 2020-03-25 MED ORDER — SENNOSIDES-DOCUSATE SODIUM 8.6-50 MG PO TABS
1.0000 | ORAL_TABLET | Freq: Every evening | ORAL | Status: DC | PRN
  Administered 2020-03-26: 1 via ORAL
  Filled 2020-03-25: qty 1

## 2020-03-25 MED ORDER — ATORVASTATIN CALCIUM 10 MG PO TABS: 10.0000 mg | ORAL_TABLET | Freq: Every day | ORAL | Status: DC

## 2020-03-25 MED ORDER — PANTOPRAZOLE SODIUM 40 MG PO TBEC
40.0000 mg | DELAYED_RELEASE_TABLET | Freq: Every day | ORAL | Status: DC
  Administered 2020-03-26 – 2020-04-06 (×12): 40 mg via ORAL
  Filled 2020-03-25 (×12): qty 1

## 2020-03-25 NOTE — Progress Notes (Signed)
Marcello Fennel, MD  Physician  Physical Medicine and Rehabilitation  PMR Pre-admission  Signed  Date of Service:  03/23/2020  4:58 PM      Related encounter: ED to Hosp-Admission (Discharged) from 03/22/2020 in [facility] SURGICAL UNIT      Signed        Show:Clear all [x] Manual[x] Template[] Copied  Added by: [x] Standley Brooking, RN[x] Marcello Fennel, MD  [] Hover for details PMR Admission Coordinator Pre-Admission Assessment   Patient: Martin Frazier. is an 86 y.o., male MRN: [id number] DOB: 04-02-1934 Height: 5\' 8"  (172.7 cm) Weight: 59 kg   Insurance Information HMO:    PPO:      PCP:      IPA:      80/20:      OTHER:  PRIMARY: Medicare a and b      Policy#: [id number]      Subscriber: pt Benefits:  Phone #: passport one online     Name: 3/24 Eff. Date: 02/28/2001     Deduct: $1484      Out of Pocket Max: none      Life Max: none CIR: 100%      SNF: 20 full days Outpatient: 80%     Co-Pay: 20% Home Health: 100%      Co-Pay: none DME: 80%     Co-Pay: 20% Providers: pt choice  SECONDARY: AARP supplement      Policy#: [id number]         Medicaid Application Date:       Case Manager:  Disability Application Date:       Case Worker:    The "Data Collection Information Summary" for patients in Inpatient Rehabilitation Facilities with attached "Privacy Act Statement-Health Care Records" was provided and verbally reviewed with: Patient and Family   Emergency Contact Information         Contact Information     Name Relation Home Work Mobile    [name],[name] Spouse [phone]   [phone]    SKEET, ROSENBOOM [phone]   [phone]         Current Medical History  Patient Admitting Diagnosis: CVA   History of Present Illness: 86 year old male with past medical history significant for GERD, Type 2 DM, HTN, chronic kidney disease stage IIIb and BPH. Presented 03/22/2020 after experiencing for the last 48 to 72 hrs intermittent episodes of  confusion/foggy mental status along with left sided weakness, numbness and balance issues. Larey Seat 03/21/2020. CT scan in ED negative, MRI with acute right thalamic infarct. Patient not using any aspirin or secondary preventive agents for he experience a year ago an episode of peptic ulcer disease.   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 Carotid duplex negative for significant stenosis. Holding ramipril to restart at d/c to allow for permissive HTN. Holding metformin. Novolog sliding scale. Acute metabolic encephalopathy with spouse say near baseline.    Complete NIHSS TOTAL: 1   Patient's medical record from [facility] has been reviewed by the rehabilitation admission coordinator and physician.   Past Medical History      Past Medical History:  Diagnosis Date  . Collapsed lung      left  . Diabetes mellitus without complication (HCC)      over 10 yrs  . GERD (gastroesophageal reflux disease)    . Hypertension    . Kidney stones        Family History   family history is not on file.  Prior Rehab/Hospitalizations Has the patient had prior rehab or hospitalizations prior to admission? Yes   Has the patient had major surgery during 100 days prior to admission? no    Current Medications   Current Facility-Administered Medications:  .  acetaminophen (TYLENOL) tablet 650 mg, 650 mg, Oral, Q4H PRN **OR** acetaminophen (TYLENOL) 160 MG/5ML solution 650 mg, 650 mg, Per Tube, Q4H PRN **OR** acetaminophen (TYLENOL) suppository 650 mg, 650 mg, Rectal, Q4H PRN, Vassie Loll, MD .  aspirin suppository 300 mg, 300 mg, Rectal, Daily **OR** aspirin tablet 325 mg, 325 mg, Oral, Daily, Vassie Loll, MD, 325 mg at 03/25/20 0851 .  atorvastatin (LIPITOR) tablet 10 mg, 10 mg, Oral, q1800, Tat, David, MD, 10 mg at 03/24/20 1750 .  heparin injection 5,000 Units, 5,000 Units, Subcutaneous, Q8H, Vassie Loll, MD, 5,000 Units at 03/25/20 0500 .  insulin aspart  (novoLOG) injection 0-5 Units, 0-5 Units, Subcutaneous, QHS, Vassie Loll, MD, 2 Units at 03/24/20 2111 .  insulin aspart (novoLOG) injection 0-9 Units, 0-9 Units, Subcutaneous, TID WC, Vassie Loll, MD, 1 Units at 03/24/20 1749 .  pantoprazole (PROTONIX) EC tablet 40 mg, 40 mg, Oral, Daily, Vassie Loll, MD, 40 mg at 03/25/20 0851 .  senna-docusate (Senokot-S) tablet 1 tablet, 1 tablet, Oral, QHS PRN, Vassie Loll, MD .  tamsulosin [facility]) capsule 0.4 mg, 0.4 mg, Oral, Daily, Vassie Loll, MD, 0.4 mg at 03/25/20 5329   Patients Current Diet:  Diet Order                      Diet heart healthy/carb modified Room service appropriate? Yes; Fluid consistency: Thin  Diet effective now                   Precautions / Restrictions Precautions Precautions: Fall Restrictions Weight Bearing Restrictions: No    Has the patient had 2 or more falls or a fall with injury in the past year? Yes twice 3 days pta   Prior Activity Level Community (5-7x/wk): Independent until 3 days pta; drives; very active   Prior Functional Level Self Care: Did the patient need help bathing, dressing, using the toilet or eating? Independent   Indoor Mobility: Did the patient need assistance with walking from room to room (with or without device)? Independent   Stairs: Did the patient need assistance with internal or external stairs (with or without device)? Independent   Functional Cognition: Did the patient need help planning regular tasks such as shopping or remembering to take medications? Independent   Home Assistive Devices / Equipment Home Assistive Devices/Equipment: Environmental consultant (specify type) Home Equipment: Walker - 2 wheels, Cane - single point, Grab bars - tub/shower   Prior Device Use: Indicate devices/aids used by the patient prior to current illness, exacerbation or injury? None of the above   Current Functional Level Cognition   Overall Cognitive Status: Within Functional Limits for  tasks assessed Orientation Level: Oriented X4    Extremity Assessment (includes Sensation/Coordination)   Upper Extremity Assessment: Defer to OT evaluation RUE Deficits / Details: ROM<50% and decreased strength due to past RC injury RUE Sensation: WNL RUE Coordination: WNL LUE Deficits / Details: ROM WFL, strength 4/5 throughout. Decreased grip strength LUE Sensation: WNL(reports of numbness) LUE Coordination: WNL  Lower Extremity Assessment: Generalized weakness, RLE deficits/detail, LLE deficits/detail RLE Deficits / Details: WNL RLE Sensation: WNL RLE Coordination: WNL LLE Deficits / Details: grossly -4/5 except ankle dorsiflexors, knee flexors, hip flexors  3+/5, LLE Sensation: decreased light touch LLE  Coordination: decreased gross motor     ADLs   Overall ADL's : Needs assistance/impaired Grooming: Wash/dry face, Set up, Sitting Grooming Details (indicate cue type and reason): unable to complete in standing due to balance Upper Body Dressing : Minimal assistance, Sitting Upper Body Dressing Details (indicate cue type and reason): assist for managing gown ties/buttones Lower Body Dressing: Supervision/safety, Sitting/lateral leans Lower Body Dressing Details (indicate cue type and reason): balance deficits preventing completion in standing Toilet Transfer: Supervision/safety, Stand-pivot Toilet Transfer Details (indicate cue type and reason): simulated with bed to chair transfer General ADL Comments: Pt requiring increased time for safety and balance, max difficulty completing in standing due to balance deficits.      Mobility   Overal bed mobility: Needs Assistance Bed Mobility: Supine to Sit Supine to sit: Min guard, Min assist General bed mobility comments: slow labored movement, limited use of LUE due decreased coordination     Transfers   Overall transfer level: Needs assistance Equipment used: Rolling walker (2 wheeled) Transfers: Sit to/from Stand, [employer]  Transfers Sit to Stand: Min guard Stand pivot transfers: Min guard, Supervision General transfer comment: unsteady on feet, labored movement, requires verbal cues for proper hand placement during stand to sitting     Ambulation / Gait / Stairs / Wheelchair Mobility   Ambulation/Gait Ambulation/Gait assistance: Herbalist (Feet): 65 Feet Assistive device: Rolling walker (2 wheeled) Gait Pattern/deviations: Decreased step length - left, Decreased stance time - left, Decreased stride length, Trunk flexed General Gait Details: increased endurance/distance for taking steps, mild difficulty advancing LLE with limited ankle dorsiflexion, increased time for making turns, limited secondary to c/o fatigue Gait velocity: decreased     Posture / Balance Dynamic Sitting Balance Sitting balance - Comments: seated at EOB, good static, fair dynamic Balance Overall balance assessment: Needs assistance Sitting-balance support: Feet supported, No upper extremity supported Sitting balance-Leahy Scale: Good Sitting balance - Comments: seated at EOB, good static, fair dynamic Postural control: Posterior lean(when completing exercises) Standing balance support: During functional activity, Bilateral upper extremity supported Standing balance-Leahy Scale: Fair Standing balance comment: using RW     Special needs/care consideration BiPAP/CPAP  N/a CPM  N/a Continuous Drip IV  N/a Dialysis n/a Life Vest  N/a Oxygen  N/a Special Bed  N/a Trach Size  N/a Wound Vac n/a Skin intact                               Bowel mgmt: continent Bladder mgmt: continent Diabetic mgmt: Hgb A1c 7.1 Behavioral consideration  Chemo/radiation  Designated visitors are wife, frances and son, Jacqualine Mau his meds through [facility]: Spouse/significant other  Lives With: Spouse Available Help at Discharge: Family, Available 24 hours/day Type of Home: House Home  Layout: One level Home Access: Stairs to enter Entrance Stairs-Rails: None Technical brewer of Steps: 2 Bathroom Shower/Tub: Multimedia programmer: Standard Bathroom Accessibility: Yes How Accessible: Accessible via walker [place]: No   Discharge Living Setting Plans for Discharge Living Setting: Patient's home, Lives with (comment)(wife) Type of Home at Discharge: House Discharge Home Layout: One level Discharge Home Access: Stairs to enter Entrance Stairs-Rails: None Entrance Stairs-Number of Steps: 2 Discharge Bathroom Shower/Tub: Walk-in shower Discharge Bathroom Toilet: Standard Discharge Bathroom Accessibility: Yes How Accessible: Accessible via walker Does the patient have any problems obtaining your medications?: No   Social/Family/Support Systems Patient  Roles: Spouse, Parent Contact Information: wife, Martin Frazier on home line best rather than cell Anticipated Caregiver: wife and son Anticipated Caregiver's Contact Information: home [phone] Ability/Limitations of Caregiver: no limitations per wife Caregiver Availability: 24/7 Discharge Plan Discussed with Primary Caregiver: Yes Is Caregiver In Agreement with Plan?: Yes Does Caregiver/Family have Issues with Lodging/Transportation while Pt is in Rehab?: No   Goals/Additional Needs Patient/Family Goal for Rehab: Mod I to supervision PT, superivison to min OT Expected length of stay: ELOS 10 to 12 days Equipment Needs: VERY HOH even with hearing aides Pt/Family Agrees to Admission and willing to participate: Yes Program Orientation Provided & Reviewed with Pt/Caregiver Including Roles  & Responsibilities: Yes   Decrease burden of Care through IP rehab admission: n/a Possible need for SNF placement upon discharge: n/a   Patient Condition: I have reviewed medical records from [facility], spoken with CSW, and patient and spouse. I discussed via phone for inpatient rehabilitation  assessment.  Patient will benefit from ongoing PT and OT, can actively participate in 3 hours of therapy a day 5 days of the week, and can make measurable gains during the admission.  Patient will also benefit from the coordinated team approach during an Inpatient Acute Rehabilitation admission.  The patient will receive intensive therapy as well as Rehabilitation physician, nursing, social worker, and care management interventions.  Due to bladder management, bowel management, safety, skin/wound care, disease management, medication administration, pain management and patient education the patient requires 24 hour a day rehabilitation nursing.  The patient is currently min to mod assist with mobility and basic ADLs.  Discharge setting and therapy post discharge at home with home health is anticipated.  Patient has agreed to participate in the Acute Inpatient Rehabilitation Program and will admit today.   Preadmission Screen Completed By:  Clois Dupes, 03/25/2020 10:08 AM ______________________________________________________________________   Discussed status with Dr. [name]  on  03/24/2020 at  1000 and received approval for admission today.   Admission Coordinator:  Clois Dupes, RN, time  1000 Date  03/25/2020    Assessment/Plan: Diagnosis: Right thalamic infarct.    1. Does the need for close, 24 hr/day Medical supervision in concert with the patient's rehab needs make it unreasonable for this patient to be served in a less intensive setting? Potentially  2. Co-Morbidities requiring supervision/potential complications: GERD, Type 2 DM (Monitor in accordance with exercise and adjust meds as necessary), HTN (monitor and provide prns in accordance with increased physical exertion and pain), chronic kidney disease stage IIIb and BPH.  3. Due to safety, disease management and patient education, does the patient require 24 hr/day rehab nursing? Potentially 4. Does the patient require  coordinated care of a physician, rehab nurse, PT, OT to address physical and functional deficits in the context of the above medical diagnosis(es)? Potentially Addressing deficits in the following areas: balance, endurance, locomotion, strength, transferring, bathing, dressing, toileting and psychosocial support 5. Can the patient actively participate in an intensive therapy program of at least 3 hrs of therapy 5 days a week? Yes 6. The potential for patient to make measurable gains while on inpatient rehab is excellent 7. Anticipated functional outcomes upon discharge from inpatient rehab: modified independent and supervision PT, modified independent and supervision OT, n/a SLP 8. Estimated rehab length of stay to reach the above functional goals is: 6-10 days. 9. Anticipated discharge destination: Home 10. Overall Rehab/Functional Prognosis: good     MD Signature: Maryla Morrow, MD, ABPMR  Revision History

## 2020-03-25 NOTE — H&P (Signed)
Physical Medicine and Rehabilitation Admission H&P    Chief Complaint  Patient presents with  . Altered Mental Status  : HPI: Martin Frazier. [place], [place]. is an 86 year old right-handed male with history of diabetes mellitus, hypertension and recently stopped smoking, CKD stage III as well as BPH.  History taken from chart review and patient due to [facility].  Patient lives with spouse.  Independent prior to admission and active.  1 level home 2 steps to entry.  He presented on 03/22/20 to [facility] with left hemiparesis and gait instability. Admission chemistries BUN 22, creatinine 1.49, urine drug screen negative, urinalysis negative, hemoglobin A1c 7.1.  CT/MRI showed acute right thalamic infarction. Patient did not receive TPA.  MRA of the head showed chronic microvascular ischemic changes with moderate to marked focal stenosis of the right paraclinoid ICA.  Echocardiogram with ejection fraction of 65% without embolus.  Carotid Dopplers atherosclerotic plaque at the carotid bulbs and proximal internal carotid arteries bilaterally.  Estimated degree of stenosis in the internal carotid arteries is less than 50% bilaterally.  Follow-up neurology services Dr.Doonquah maintain on aspirin 325 mg daily.  Subcutaneous heparin for DVT prophylaxis.  Tolerating a regular consistency diet.  Therapy evaluations completed and patient was admitted for a comprehensive rehab program.  Please see preadmission assessment from earlier today as well.  Review of Systems  Constitutional: Negative for chills and fever.  HENT: Positive for hearing loss.   Eyes: Negative for blurred vision and double vision.  Respiratory: Negative for cough and shortness of breath.   Cardiovascular: Negative for chest pain, palpitations and leg swelling.  Gastrointestinal: Positive for constipation.       GERD  Genitourinary: Positive for urgency. Negative for dysuria, flank pain and hematuria.  Musculoskeletal: Positive for myalgias.    Skin: Negative for rash.  Neurological: Positive for weakness.   Past Medical History:  Diagnosis Date  . Collapsed lung    left  . Diabetes mellitus without complication (HCC)    over 10 yrs  . GERD (gastroesophageal reflux disease)   . Hypertension   . Kidney stones    Past Surgical History:  Procedure Laterality Date  . APPENDECTOMY    . BIOPSY  04/01/2019   Procedure: BIOPSY;  Surgeon: Malissa Hippo, MD;  Location: [facility];  Service: Endoscopy;;  gastric  . CYSTOSCOPY W/ URETERAL STENT PLACEMENT Right 04/03/2019   Procedure: CYSTOSCOPY WITH RETROGRADE PYELOGRAM/URETERAL STENT PLACEMENT;  Surgeon: Marcine Matar, MD;  Location: [facility] OR[facility];  Service: Urology;  Laterality: Right;  . ESOPHAGOGASTRODUODENOSCOPY N/A 04/01/2019   Procedure: ESOPHAGOGASTRODUODENOSCOPY (EGD);  Surgeon: Malissa Hippo, MD;  Location: [facility];  Service: Endoscopy;  Laterality: N/A;  10:55am  . EXTRACORPOREAL SHOCK WAVE LITHOTRIPSY Right 04/16/2019   Procedure: EXTRACORPOREAL SHOCK WAVE LITHOTRIPSY (ESWL);  Surgeon: Jerilee Field, MD;  Location: [facility];  Service: Urology;  Laterality: Right;  . HEMORRHOID SURGERY    . KIDNEY STONE SURGERY    . TONSILLECTOMY     No family history on file. Social History:  reports that he has quit smoking. His smoking use included cigarettes. He has never used smokeless tobacco. He reports that he does not drink alcohol or use drugs. Allergies:  Allergies  Allergen Reactions  . Sulfa Antibiotics Nausea And Vomiting   Medications Prior to Admission  Medication Sig Dispense Refill  . Cholecalciferol (VITAMIN D) 2000 units CAPS Take 1 capsule by mouth daily.    . Ginkgo Biloba (GINKOBA PO) Take 1 tablet  by mouth daily.     . Lactobacillus (ACIDOPHILUS PO) Take 1 capsule by mouth daily.    . Magnesium 250 MG TABS Take 1 tablet by mouth daily.    . metFORMIN (GLUCOPHAGE) 500 MG tablet Take 500 mg by mouth daily with breakfast.     . omeprazole (PRILOSEC) 40  MG capsule Take 40 mg by mouth daily.    . ramipril (ALTACE) 2.5 MG capsule Take 2.5 mg by mouth daily.    . Saw Palmetto, Serenoa repens, (SAW PALMETTO PO) Take 1 capsule by mouth daily.    . tamsulosin (FLOMAX) 0.4 MG CAPS capsule Take 0.4 mg by mouth daily.     . vitamin B-12 (CYANOCOBALAMIN) 1000 MCG tablet Take 1,000 mcg by mouth daily.     . vitamin E 400 UNIT capsule Take 400 Units by mouth daily.      Drug Regimen Review Drug regimen was reviewed and remains appropriate with no significant issues identified  Home: Home Living Family/patient expects to be discharged to:: Private residence Living Arrangements: Spouse/significant other Available Help at Discharge: Family, Available 24 hours/day Type of Home: House Home Access: Stairs to enter Secretary/administrator of Steps: 2 Entrance Stairs-Rails: None Home Layout: One level Bathroom Shower/Tub: Health visitor: Standard Bathroom Accessibility: Yes Home Equipment: Environmental consultant - 2 wheels, Cane - single point, Grab bars - tub/shower  Lives With: Spouse   Functional History: Prior Function Level of Independence: Independent Comments: Tourist information centre manager, drives, chops wood, rides tractor, etc  Functional Status:  Mobility: Bed Mobility Overal bed mobility: Needs Assistance Bed Mobility: Supine to Sit Supine to sit: Min guard, Min assist General bed mobility comments: slow labored movement, limited use of LUE due decreased coordination Transfers Overall transfer level: Needs assistance Equipment used: Rolling walker (2 wheeled) Transfers: Sit to/from Stand, [employer] Transfers Sit to Stand: Min guard Stand pivot transfers: Min guard, Supervision General transfer comment: unsteady on feet, labored movement, requires verbal cues for proper hand placement during stand to sitting Ambulation/Gait Ambulation/Gait assistance: Min assist Gait Distance (Feet): 65 Feet Assistive device: Rolling walker (2  wheeled) Gait Pattern/deviations: Decreased step length - left, Decreased stance time - left, Decreased stride length, Trunk flexed General Gait Details: increased endurance/distance for taking steps, mild difficulty advancing LLE with limited ankle dorsiflexion, increased time for making turns, limited secondary to c/o fatigue Gait velocity: decreased    ADL: ADL Overall ADL's : Needs assistance/impaired Grooming: Wash/dry face, Set up, Sitting Grooming Details (indicate cue type and reason): unable to complete in standing due to balance Upper Body Dressing : Minimal assistance, Sitting Upper Body Dressing Details (indicate cue type and reason): assist for managing gown ties/buttones Lower Body Dressing: Supervision/safety, Sitting/lateral leans Lower Body Dressing Details (indicate cue type and reason): balance deficits preventing completion in standing Toilet Transfer: Supervision/safety, Stand-pivot Toilet Transfer Details (indicate cue type and reason): simulated with bed to chair transfer General ADL Comments: Pt requiring increased time for safety and balance, max difficulty completing in standing due to balance deficits.   Cognition: Cognition Overall Cognitive Status: Within Functional Limits for tasks assessed Orientation Level: Oriented X4 Cognition Arousal/Alertness: Awake/alert Behavior During Therapy: WFL for tasks assessed/performed Overall Cognitive Status: Within Functional Limits for tasks assessed  Physical Exam: Blood pressure 131/71, pulse 95, temperature 98 F (36.7 C), temperature source Oral, resp. rate 18, height 5\' 8"  (1.727 m), weight 59 kg, SpO2 96 %. Physical Exam  Vitals reviewed. Constitutional: He appears well-developed and well-nourished.  HENT:  Head:  Normocephalic and atraumatic.  Eyes: EOM are normal. Right eye exhibits no discharge. Left eye exhibits no discharge.  Neck: No tracheal deviation present. No thyromegaly present.  Respiratory:  Effort normal. No respiratory distress.  GI: Soft. He exhibits no distension.  Musculoskeletal:     Comments: No edema or tenderness in extremities  Neurological: He is alert.  HOH  makes Eye contact with examiner and follows commands.   Oriented x3 Motor: 4+-5/5 throughout, except for right shoulder, limited due to rotator cuff injury  Skin: Skin is warm and dry.  Psychiatric: He has a normal mood and affect. His behavior is normal.    Results for orders placed or performed during the hospital encounter of 03/22/20 (from the past 48 hour(s))  Glucose, capillary     Status: Abnormal   Collection Time: 03/23/20 11:16 AM  Result Value Ref Range   Glucose-Capillary 230 (H) 70 - 99 mg/dL    Comment: Glucose reference range applies only to samples taken after fasting for at least 8 hours.  Glucose, capillary     Status: Abnormal   Collection Time: 03/23/20  4:25 PM  Result Value Ref Range   Glucose-Capillary 144 (H) 70 - 99 mg/dL    Comment: Glucose reference range applies only to samples taken after fasting for at least 8 hours.  Glucose, capillary     Status: Abnormal   Collection Time: 03/23/20  8:03 PM  Result Value Ref Range   Glucose-Capillary 179 (H) 70 - 99 mg/dL    Comment: Glucose reference range applies only to samples taken after fasting for at least 8 hours.   Comment 1 Notify RN    Comment 2 Document in Chart   Glucose, capillary     Status: Abnormal   Collection Time: 03/24/20  7:54 AM  Result Value Ref Range   Glucose-Capillary 125 (H) 70 - 99 mg/dL    Comment: Glucose reference range applies only to samples taken after fasting for at least 8 hours.  Glucose, capillary     Status: Abnormal   Collection Time: 03/24/20 11:41 AM  Result Value Ref Range   Glucose-Capillary 154 (H) 70 - 99 mg/dL    Comment: Glucose reference range applies only to samples taken after fasting for at least 8 hours.  Glucose, capillary     Status: Abnormal   Collection Time: 03/24/20  5:13  PM  Result Value Ref Range   Glucose-Capillary 136 (H) 70 - 99 mg/dL    Comment: Glucose reference range applies only to samples taken after fasting for at least 8 hours.   Comment 1 Notify RN    Comment 2 Document in Chart   Glucose, capillary     Status: Abnormal   Collection Time: 03/24/20  9:09 PM  Result Value Ref Range   Glucose-Capillary 249 (H) 70 - 99 mg/dL    Comment: Glucose reference range applies only to samples taken after fasting for at least 8 hours.  Glucose, capillary     Status: Abnormal   Collection Time: 03/25/20  7:43 AM  Result Value Ref Range   Glucose-Capillary 119 (H) 70 - 99 mg/dL    Comment: Glucose reference range applies only to samples taken after fasting for at least 8 hours.   ECHOCARDIOGRAM COMPLETE  Result Date: 03/23/2020    ECHOCARDIOGRAM REPORT   Patient Name:   Wisam Siefring. Date of Exam: 03/23/2020 Medical Rec #:  [id number]       Height:  68.0 in Accession #:    [id number]      Weight:       130.0 lb Date of Birth:  Feb 28, 1934       BSA:          1.702 m Patient Age:    79 years        BP:           114/63 mmHg Patient Gender: M               HR:           89 bpm. Exam Location:  [facility] Procedure: 2D Echo, Cardiac Doppler and Color Doppler Indications:    Stroke 434.91 / I163.9  History:        Patient has no prior history of Echocardiogram examinations.                 Risk Factors:Diabetes and Hypertension. GERD.  Sonographer:    Alvino Chapel RCS Referring Phys: [place]  1. Left ventricular ejection fraction, by estimation, is 60 to 65%. The left ventricle has normal function. The left ventricle has no regional wall motion abnormalities. Left ventricular diastolic parameters are consistent with Grade I diastolic dysfunction (impaired relaxation).  2. Right ventricular systolic function is normal. The right ventricular size is normal. There is normal pulmonary artery systolic pressure.  3. The mitral valve is normal in  structure. Trivial mitral valve regurgitation. No evidence of mitral stenosis.  4. The aortic valve is tricuspid. Aortic valve regurgitation is mild. No aortic stenosis is present.  5. The inferior vena cava is normal in size with greater than 50% respiratory variability, suggesting right atrial pressure of 3 mmHg. FINDINGS  Left Ventricle: Left ventricular ejection fraction, by estimation, is 60 to 65%. The left ventricle has normal function. The left ventricle has no regional wall motion abnormalities. The left ventricular internal cavity size was normal in size. There is  no left ventricular hypertrophy. Left ventricular diastolic parameters are consistent with Grade I diastolic dysfunction (impaired relaxation). Normal left ventricular filling pressure. Right Ventricle: The right ventricular size is normal. No increase in right ventricular wall thickness. Right ventricular systolic function is normal. There is normal pulmonary artery systolic pressure. The tricuspid regurgitant velocity is 2.37 m/s, and  with an assumed right atrial pressure of 3 mmHg, the estimated right ventricular systolic pressure is 66.2 mmHg. Left Atrium: Left atrial size was normal in size. Right Atrium: Right atrial size was normal in size. Pericardium: There is no evidence of pericardial effusion. Mitral Valve: The mitral valve is normal in structure. Trivial mitral valve regurgitation. No evidence of mitral valve stenosis. Tricuspid Valve: The tricuspid valve is normal in structure. Tricuspid valve regurgitation is mild . No evidence of tricuspid stenosis. Aortic Valve: The aortic valve is tricuspid. Aortic valve regurgitation is mild. No aortic stenosis is present. Aortic valve mean gradient measures 2.3 mmHg. Aortic valve peak gradient measures 4.5 mmHg. Aortic valve area, by VTI measures 2.79 cm. Pulmonic Valve: The pulmonic valve was not well visualized. Pulmonic valve regurgitation is not visualized. No evidence of pulmonic  stenosis. Aorta: The aortic root is normal in size and structure. Venous: The inferior vena cava is normal in size with greater than 50% respiratory variability, suggesting right atrial pressure of 3 mmHg. IAS/Shunts: No atrial level shunt detected by color flow Doppler.  LEFT VENTRICLE PLAX 2D LVIDd:         3.21 cm  Diastology LVIDs:         2.40 cm     LV e' lateral:   7.94 cm/s LV PW:         0.93 cm     LV E/e' lateral: 5.5 LV IVS:        0.90 cm     LV e' medial:    4.35 cm/s LVOT diam:     2.00 cm     LV E/e' medial:  10.0 LV SV:         51 LV SV Index:   30 LVOT Area:     3.14 cm  LV Volumes (MOD) LV vol d, MOD A2C: 40.1 ml LV vol d, MOD A4C: 51.1 ml LV vol s, MOD A2C: 17.1 ml LV vol s, MOD A4C: 24.3 ml LV SV MOD A2C:     23.0 ml LV SV MOD A4C:     51.1 ml LV SV MOD BP:      23.4 ml RIGHT VENTRICLE RV Mid diam:    2.61 cm RV S prime:     10.00 cm/s TAPSE (M-mode): 1.4 cm LEFT ATRIUM             Index LA diam:        3.80 cm 2.23 cm/m LA Vol (A2C):   41.9 ml 24.62 ml/m LA Vol (A4C):   34.1 ml 20.04 ml/m LA Biplane Vol: 38.6 ml 22.68 ml/m  AORTIC VALVE AV Area (Vmax):    2.64 cm AV Area (Vmean):   2.95 cm AV Area (VTI):     2.79 cm AV Vmax:           106.19 cm/s AV Vmean:          70.969 cm/s AV VTI:            0.182 m AV Peak Grad:      4.5 mmHg AV Mean Grad:      2.3 mmHg LVOT Vmax:         89.40 cm/s LVOT Vmean:        66.700 cm/s LVOT VTI:          0.162 m LVOT/AV VTI ratio: 0.89  AORTA Ao Root diam: 3.00 cm MITRAL VALVE                TRICUSPID VALVE MV Area (PHT): 3.30 cm     TR Peak grad:   22.5 mmHg MV Decel Time: 230 msec     TR Vmax:        237.00 cm/s MV E velocity: 43.70 cm/s MV A velocity: 104.00 cm/s  SHUNTS MV E/A ratio:  0.42         Systemic VTI:  0.16 m                             Systemic Diam: 2.00 cm Dina Rich MD Electronically signed by Dina Rich MD Signature Date/Time: 03/23/2020/3:25:06 PM    Final        Medical Problem List and Plan: 1.  Left side  weakness secondary to right thalamic infarction  -patient may shower  -ELOS/Goals: 4-7 days/mod I  Admit to CIR 2.  Antithrombotics: -DVT/anticoagulation: Subcutaneous heparin  -antiplatelet therapy: Aspirin 325 mg daily 3. Pain Management: Tylenol as needed 4. Mood: Provide emotional support  -antipsychotic agents: N/A 5. Neuropsych: This patient is capable of making decisions on his own behalf. 6. Skin/Wound Care: Routine skin checks  7. Fluids/Electrolytes/Nutrition: Routine in and outs.  CMP ordered 8.  Hypertension.  Permissive hypertension.  Patient on Altace 2.5 mg daily prior to admission.  Resume as needed  Monitor with increased mobility. 9.  Diabetes mellitus.  Hemoglobin A1c 7.1.  Currently with SSI.  Patient on Glucophage 500 mg daily prior to admission.  Resume as needed  Monitor with increased mobility. 10.  Hyperlipidemia.  Continue Lipitor 11.  BPH.  Flomax 0.4 mg daily.  Check PVR 12.  Recent history of tobacco abuse.  Counseling  Mcarthur Rossetti Angiulli, PA-C 03/25/2020  I have personally performed a face to face diagnostic evaluation, including, but not limited to relevant history and physical exam findings, of this patient and developed relevant assessment and plan.  Additionally, I have reviewed and concur with the physician assistant's documentation above.   Maryla Morrow, MD, ABPMR

## 2020-03-25 NOTE — TOC Transition Note (Signed)
Transition of Care [facility]) - CM/SW Discharge Note   Patient Details  Name: [name]. MRN: [id number] Date of Birth: [birth year]  Transition of Care [facility] CM/SW Contact:  Ewing Schlein, LCSW Phone Number: 03/25/2020, 10:30 AM   Clinical Narrative: TOC in contact with RN, Ottie Glazier, to set up transport to [facility]. EMTALA form started and RN notified. TOC signing off.   Final next level of care: Other (comment)(CIR) Barriers to Discharge: Barriers Resolved   Patient Goals and CMS Choice   CMS Medicare.gov Compare Post Acute Care list provided to:: Patient Choice offered to / list presented to : Patient  Discharge Placement  CIR  Discharge Plan and Services    Readmission Risk Interventions No flowsheet data found.

## 2020-03-25 NOTE — H&P (Signed)
Physical Medicine and Rehabilitation Admission H&P    Chief Complaint  Patient presents with  . Altered Mental Status  : HPI: Martin Frazier, Sig[place]. is an 86 year old right-handed male with history of diabetes mellitus, hypertension and recently stopped smoking, CKD stage III as well as BPH.  History taken from chart review and patient due to [facility].  Patient lives with spouse.  Independent prior to admission and active.  1 level home 2 steps to entry.  He presented on 03/22/20 to [facility] with left hemiparesis and gait instability. Admission chemistries BUN 22, creatinine 1.49, urine drug screen negative, urinalysis negative, hemoglobin A1c 7.1.  CT/MRI showed acute right thalamic infarction. Patient did not receive TPA.  MRA of the head showed chronic microvascular ischemic changes with moderate to marked focal stenosis of the right paraclinoid ICA.  Echocardiogram with ejection fraction of 65% without embolus.  Carotid Dopplers atherosclerotic plaque at the carotid bulbs and proximal internal carotid arteries bilaterally.  Estimated degree of stenosis in the internal carotid arteries is less than 50% bilaterally.  Follow-up neurology services Dr.Doonquah maintain on aspirin 325 mg daily.  Subcutaneous heparin for DVT prophylaxis.  Tolerating a regular consistency diet.  Therapy evaluations completed and patient was admitted for a comprehensive rehab program.  Please see preadmission assessment from earlier today as well.  Review of Systems  Constitutional: Negative for chills and fever.  HENT: Positive for hearing loss.   Eyes: Negative for blurred vision and double vision.  Respiratory: Negative for cough and shortness of breath.   Cardiovascular: Negative for chest pain, palpitations and leg swelling.  Gastrointestinal: Positive for constipation.       GERD  Genitourinary: Positive for urgency. Negative for dysuria, flank pain and hematuria.  Musculoskeletal: Positive for myalgias.    Skin: Negative for rash.  Neurological: Positive for weakness.   Past Medical History:  Diagnosis Date  . Collapsed lung    left  . Diabetes mellitus without complication (HCC)    over 10 yrs  . GERD (gastroesophageal reflux disease)   . Hypertension   . Kidney stones    Past Surgical History:  Procedure Laterality Date  . APPENDECTOMY    . BIOPSY  04/01/2019   Procedure: BIOPSY;  Surgeon: Malissa Hippo, MD;  Location: [facility];  Service: Endoscopy;;  gastric  . CYSTOSCOPY W/ URETERAL STENT PLACEMENT Right 04/03/2019   Procedure: CYSTOSCOPY WITH RETROGRADE PYELOGRAM/URETERAL STENT PLACEMENT;  Surgeon: Marcine Matar, MD;  Location: [facility] OR[facility];  Service: Urology;  Laterality: Right;  . ESOPHAGOGASTRODUODENOSCOPY N/A 04/01/2019   Procedure: ESOPHAGOGASTRODUODENOSCOPY (EGD);  Surgeon: Malissa Hippo, MD;  Location: [facility];  Service: Endoscopy;  Laterality: N/A;  10:55am  . EXTRACORPOREAL SHOCK WAVE LITHOTRIPSY Right 04/16/2019   Procedure: EXTRACORPOREAL SHOCK WAVE LITHOTRIPSY (ESWL);  Surgeon: Jerilee Field, MD;  Location: [facility];  Service: Urology;  Laterality: Right;  . HEMORRHOID SURGERY    . KIDNEY STONE SURGERY    . TONSILLECTOMY     No family history on file. Social History:  reports that he has quit smoking. His smoking use included cigarettes. He has never used smokeless tobacco. He reports that he does not drink alcohol or use drugs. Allergies:  Allergies  Allergen Reactions  . Sulfa Antibiotics Nausea And Vomiting   Medications Prior to Admission  Medication Sig Dispense Refill  . Cholecalciferol (VITAMIN D) 2000 units CAPS Take 1 capsule by mouth daily.    . Ginkgo Biloba (GINKOBA PO) Take 1 tablet  by mouth daily.     . Lactobacillus (ACIDOPHILUS PO) Take 1 capsule by mouth daily.    . Magnesium 250 MG TABS Take 1 tablet by mouth daily.    . metFORMIN (GLUCOPHAGE) 500 MG tablet Take 500 mg by mouth daily with breakfast.     . omeprazole (PRILOSEC) 40  MG capsule Take 40 mg by mouth daily.    . ramipril (ALTACE) 2.5 MG capsule Take 2.5 mg by mouth daily.    . Saw Palmetto, Serenoa repens, (SAW PALMETTO PO) Take 1 capsule by mouth daily.    . tamsulosin (FLOMAX) 0.4 MG CAPS capsule Take 0.4 mg by mouth daily.     . vitamin B-12 (CYANOCOBALAMIN) 1000 MCG tablet Take 1,000 mcg by mouth daily.     . vitamin E 400 UNIT capsule Take 400 Units by mouth daily.      Drug Regimen Review Drug regimen was reviewed and remains appropriate with no significant issues identified  Home: Home Living Family/patient expects to be discharged to:: Private residence Living Arrangements: Spouse/significant other Available Help at Discharge: Family, Available 24 hours/day Type of Home: House Home Access: Stairs to enter Secretary/administrator of Steps: 2 Entrance Stairs-Rails: None Home Layout: One level Bathroom Shower/Tub: Health visitor: Standard Bathroom Accessibility: Yes Home Equipment: Environmental consultant - 2 wheels, Cane - single point, Grab bars - tub/shower  Lives With: Spouse   Functional History: Prior Function Level of Independence: Independent Comments: Tourist information centre manager, drives, chops wood, rides tractor, etc  Functional Status:  Mobility: Bed Mobility Overal bed mobility: Needs Assistance Bed Mobility: Supine to Sit Supine to sit: Min guard, Min assist General bed mobility comments: slow labored movement, limited use of LUE due decreased coordination Transfers Overall transfer level: Needs assistance Equipment used: Rolling walker (2 wheeled) Transfers: Sit to/from Stand, [employer] Transfers Sit to Stand: Min guard Stand pivot transfers: Min guard, Supervision General transfer comment: unsteady on feet, labored movement, requires verbal cues for proper hand placement during stand to sitting Ambulation/Gait Ambulation/Gait assistance: Min assist Gait Distance (Feet): 65 Feet Assistive device: Rolling walker (2  wheeled) Gait Pattern/deviations: Decreased step length - left, Decreased stance time - left, Decreased stride length, Trunk flexed General Gait Details: increased endurance/distance for taking steps, mild difficulty advancing LLE with limited ankle dorsiflexion, increased time for making turns, limited secondary to c/o fatigue Gait velocity: decreased    ADL: ADL Overall ADL's : Needs assistance/impaired Grooming: Wash/dry face, Set up, Sitting Grooming Details (indicate cue type and reason): unable to complete in standing due to balance Upper Body Dressing : Minimal assistance, Sitting Upper Body Dressing Details (indicate cue type and reason): assist for managing gown ties/buttones Lower Body Dressing: Supervision/safety, Sitting/lateral leans Lower Body Dressing Details (indicate cue type and reason): balance deficits preventing completion in standing Toilet Transfer: Supervision/safety, Stand-pivot Toilet Transfer Details (indicate cue type and reason): simulated with bed to chair transfer General ADL Comments: Pt requiring increased time for safety and balance, max difficulty completing in standing due to balance deficits.   Cognition: Cognition Overall Cognitive Status: Within Functional Limits for tasks assessed Orientation Level: Oriented X4 Cognition Arousal/Alertness: Awake/alert Behavior During Therapy: WFL for tasks assessed/performed Overall Cognitive Status: Within Functional Limits for tasks assessed  Physical Exam: Blood pressure 131/71, pulse 95, temperature 98 F (36.7 C), temperature source Oral, resp. rate 18, height 5\' 8"  (1.727 m), weight 59 kg, SpO2 96 %. Physical Exam  Vitals reviewed. Constitutional: He appears well-developed and well-nourished.  HENT:  Head:  Normocephalic and atraumatic.  Eyes: EOM are normal. Right eye exhibits no discharge. Left eye exhibits no discharge.  Neck: No tracheal deviation present. No thyromegaly present.  Respiratory:  Effort normal. No respiratory distress.  GI: Soft. He exhibits no distension.  Musculoskeletal:     Comments: No edema or tenderness in extremities  Neurological: He is alert.  HOH  makes Eye contact with examiner and follows commands.   Oriented x3 Motor: 4+-5/5 throughout, except for right shoulder, limited due to rotator cuff injury  Skin: Skin is warm and dry.  Psychiatric: He has a normal mood and affect. His behavior is normal.    Results for orders placed or performed during the hospital encounter of 03/22/20 (from the past 48 hour(s))  Glucose, capillary     Status: Abnormal   Collection Time: 03/23/20 11:16 AM  Result Value Ref Range   Glucose-Capillary 230 (H) 70 - 99 mg/dL    Comment: Glucose reference range applies only to samples taken after fasting for at least 8 hours.  Glucose, capillary     Status: Abnormal   Collection Time: 03/23/20  4:25 PM  Result Value Ref Range   Glucose-Capillary 144 (H) 70 - 99 mg/dL    Comment: Glucose reference range applies only to samples taken after fasting for at least 8 hours.  Glucose, capillary     Status: Abnormal   Collection Time: 03/23/20  8:03 PM  Result Value Ref Range   Glucose-Capillary 179 (H) 70 - 99 mg/dL    Comment: Glucose reference range applies only to samples taken after fasting for at least 8 hours.   Comment 1 Notify RN    Comment 2 Document in Chart   Glucose, capillary     Status: Abnormal   Collection Time: 03/24/20  7:54 AM  Result Value Ref Range   Glucose-Capillary 125 (H) 70 - 99 mg/dL    Comment: Glucose reference range applies only to samples taken after fasting for at least 8 hours.  Glucose, capillary     Status: Abnormal   Collection Time: 03/24/20 11:41 AM  Result Value Ref Range   Glucose-Capillary 154 (H) 70 - 99 mg/dL    Comment: Glucose reference range applies only to samples taken after fasting for at least 8 hours.  Glucose, capillary     Status: Abnormal   Collection Time: 03/24/20  5:13  PM  Result Value Ref Range   Glucose-Capillary 136 (H) 70 - 99 mg/dL    Comment: Glucose reference range applies only to samples taken after fasting for at least 8 hours.   Comment 1 Notify RN    Comment 2 Document in Chart   Glucose, capillary     Status: Abnormal   Collection Time: 03/24/20  9:09 PM  Result Value Ref Range   Glucose-Capillary 249 (H) 70 - 99 mg/dL    Comment: Glucose reference range applies only to samples taken after fasting for at least 8 hours.  Glucose, capillary     Status: Abnormal   Collection Time: 03/25/20  7:43 AM  Result Value Ref Range   Glucose-Capillary 119 (H) 70 - 99 mg/dL    Comment: Glucose reference range applies only to samples taken after fasting for at least 8 hours.   ECHOCARDIOGRAM COMPLETE  Result Date: 03/23/2020    ECHOCARDIOGRAM REPORT   Patient Name:   Martin Frazier. Date of Exam: 03/23/2020 Medical Rec #:  [id number]       Height:  68.0 in Accession #:    [id number]      Weight:       130.0 lb Date of Birth:  Feb 28, 1934       BSA:          1.702 m Patient Age:    79 years        BP:           114/63 mmHg Patient Gender: M               HR:           89 bpm. Exam Location:  [facility] Procedure: 2D Echo, Cardiac Doppler and Color Doppler Indications:    Stroke 434.91 / I163.9  History:        Patient has no prior history of Echocardiogram examinations.                 Risk Factors:Diabetes and Hypertension. GERD.  Sonographer:    Alvino Chapel RCS Referring Phys: [place]  1. Left ventricular ejection fraction, by estimation, is 60 to 65%. The left ventricle has normal function. The left ventricle has no regional wall motion abnormalities. Left ventricular diastolic parameters are consistent with Grade I diastolic dysfunction (impaired relaxation).  2. Right ventricular systolic function is normal. The right ventricular size is normal. There is normal pulmonary artery systolic pressure.  3. The mitral valve is normal in  structure. Trivial mitral valve regurgitation. No evidence of mitral stenosis.  4. The aortic valve is tricuspid. Aortic valve regurgitation is mild. No aortic stenosis is present.  5. The inferior vena cava is normal in size with greater than 50% respiratory variability, suggesting right atrial pressure of 3 mmHg. FINDINGS  Left Ventricle: Left ventricular ejection fraction, by estimation, is 60 to 65%. The left ventricle has normal function. The left ventricle has no regional wall motion abnormalities. The left ventricular internal cavity size was normal in size. There is  no left ventricular hypertrophy. Left ventricular diastolic parameters are consistent with Grade I diastolic dysfunction (impaired relaxation). Normal left ventricular filling pressure. Right Ventricle: The right ventricular size is normal. No increase in right ventricular wall thickness. Right ventricular systolic function is normal. There is normal pulmonary artery systolic pressure. The tricuspid regurgitant velocity is 2.37 m/s, and  with an assumed right atrial pressure of 3 mmHg, the estimated right ventricular systolic pressure is 66.2 mmHg. Left Atrium: Left atrial size was normal in size. Right Atrium: Right atrial size was normal in size. Pericardium: There is no evidence of pericardial effusion. Mitral Valve: The mitral valve is normal in structure. Trivial mitral valve regurgitation. No evidence of mitral valve stenosis. Tricuspid Valve: The tricuspid valve is normal in structure. Tricuspid valve regurgitation is mild . No evidence of tricuspid stenosis. Aortic Valve: The aortic valve is tricuspid. Aortic valve regurgitation is mild. No aortic stenosis is present. Aortic valve mean gradient measures 2.3 mmHg. Aortic valve peak gradient measures 4.5 mmHg. Aortic valve area, by VTI measures 2.79 cm. Pulmonic Valve: The pulmonic valve was not well visualized. Pulmonic valve regurgitation is not visualized. No evidence of pulmonic  stenosis. Aorta: The aortic root is normal in size and structure. Venous: The inferior vena cava is normal in size with greater than 50% respiratory variability, suggesting right atrial pressure of 3 mmHg. IAS/Shunts: No atrial level shunt detected by color flow Doppler.  LEFT VENTRICLE PLAX 2D LVIDd:         3.21 cm  Diastology LVIDs:         2.40 cm     LV e' lateral:   7.94 cm/s LV PW:         0.93 cm     LV E/e' lateral: 5.5 LV IVS:        0.90 cm     LV e' medial:    4.35 cm/s LVOT diam:     2.00 cm     LV E/e' medial:  10.0 LV SV:         51 LV SV Index:   30 LVOT Area:     3.14 cm  LV Volumes (MOD) LV vol d, MOD A2C: 40.1 ml LV vol d, MOD A4C: 51.1 ml LV vol s, MOD A2C: 17.1 ml LV vol s, MOD A4C: 24.3 ml LV SV MOD A2C:     23.0 ml LV SV MOD A4C:     51.1 ml LV SV MOD BP:      23.4 ml RIGHT VENTRICLE RV Mid diam:    2.61 cm RV S prime:     10.00 cm/s TAPSE (M-mode): 1.4 cm LEFT ATRIUM             Index LA diam:        3.80 cm 2.23 cm/m LA Vol (A2C):   41.9 ml 24.62 ml/m LA Vol (A4C):   34.1 ml 20.04 ml/m LA Biplane Vol: 38.6 ml 22.68 ml/m  AORTIC VALVE AV Area (Vmax):    2.64 cm AV Area (Vmean):   2.95 cm AV Area (VTI):     2.79 cm AV Vmax:           106.19 cm/s AV Vmean:          70.969 cm/s AV VTI:            0.182 m AV Peak Grad:      4.5 mmHg AV Mean Grad:      2.3 mmHg LVOT Vmax:         89.40 cm/s LVOT Vmean:        66.700 cm/s LVOT VTI:          0.162 m LVOT/AV VTI ratio: 0.89  AORTA Ao Root diam: 3.00 cm MITRAL VALVE                TRICUSPID VALVE MV Area (PHT): 3.30 cm     TR Peak grad:   22.5 mmHg MV Decel Time: 230 msec     TR Vmax:        237.00 cm/s MV E velocity: 43.70 cm/s MV A velocity: 104.00 cm/s  SHUNTS MV E/A ratio:  0.42         Systemic VTI:  0.16 m                             Systemic Diam: 2.00 cm Dina Rich MD Electronically signed by Dina Rich MD Signature Date/Time: 03/23/2020/3:25:06 PM    Final        Medical Problem List and Plan: 1.  Left side  weakness secondary to right thalamic infarction  -patient may shower  -ELOS/Goals: 4-7 days/mod I  Admit to CIR 2.  Antithrombotics: -DVT/anticoagulation: Subcutaneous heparin  -antiplatelet therapy: Aspirin 325 mg daily 3. Pain Management: Tylenol as needed 4. Mood: Provide emotional support  -antipsychotic agents: N/A 5. Neuropsych: This patient is capable of making decisions on his own behalf. 6. Skin/Wound Care: Routine skin checks  7. Fluids/Electrolytes/Nutrition: Routine in and outs.  CMP ordered 8.  Hypertension.  Permissive hypertension.  Patient on Altace 2.5 mg daily prior to admission.  Resume as needed  Monitor with increased mobility. 9.  Diabetes mellitus.  Hemoglobin A1c 7.1.  Currently with SSI.  Patient on Glucophage 500 mg daily prior to admission.  Resume as needed  Monitor with increased mobility. 10.  Hyperlipidemia.  Continue Lipitor 11.  BPH.  Flomax 0.4 mg daily.  Check PVR 12.  Recent history of tobacco abuse.  Counseling  Mcarthur Rossetti Angiulli, PA-C 03/25/2020  I have personally performed a face to face diagnostic evaluation, including, but not limited to relevant history and physical exam findings, of this patient and developed relevant assessment and plan.  Additionally, I have reviewed and concur with the physician assistant's documentation above.   Maryla Morrow, MD, ABPMR  The patient's status has not changed. The original post admission physician evaluation remains appropriate, and any changes from the pre-admission screening or documentation from the acute chart are noted above.   Maryla Morrow, MD, ABPMR

## 2020-03-25 NOTE — Progress Notes (Signed)
Visitors not here to sign visitor sheet, placed bathroom door

## 2020-03-25 NOTE — Progress Notes (Signed)
Inpatient Rehabilitation Admissions Coordinator  I have CIR bed 4w21 to admit patient today. I have spoken with his wife who is at bedside this morning and they are accepting. I have contacted Dr. Louis Matte, RN CM and RN , Corrie Dandy . I contacted Cathy at [facility] to arrange pickup at 11 am this morning.. I will complete the arrangements for CIR admit today.  Ottie Glazier, RN, MSN Rehab Admissions Coordinator [phone] 03/25/2020 10:05 AM

## 2020-03-25 NOTE — Discharge Summary (Signed)
Physician Discharge Summary  Martin Frazier. XBM:[id number] DOB: 14-Sep-1934 DOA: 03/22/2020  PCP: Suzan Slick, MD  Admit date: 03/22/2020 Discharge date: 03/25/2020  Admitted From: Home Disposition:  CIR   Discharge Condition: Stable CODE STATUS: FULL Diet recommendation: Heart Healthy / Carb Modified   Brief/Interim Summary: 86 year old male with a history of diabetes mellitus type 2, hypertension, CKD stage IIIb presenting with approximately 1 to 2-day history of numbness in his left hand and left leg as well as weakness in his left upper extremity. In addition, the patient has had some gait instability resulting in a fall on the day prior to admission. He denied syncope. He denied any fevers, chills, chest pain, shortness breath, nausea, vomiting, diarrhea, abdominal pain. His wife feels that the patient has had a foggy sensorium over the past 2 days prior to admission. The patient denies any headache, visual disturbance, speech difficulty or facial droop. In the emergency department, MRI of the brain showed an acute right thalamic infarct. He was admitted for further evaluation and treatment.  Discharge Diagnoses:  Acute right thalamic stroke -Neurology Consulted -PT/OT evaluation>>>CIR -Speech therapy eval -CT brain--neg -MRI brain--acute right thalamic infarct -MRA brain--moderate to severe focal stenosis of the right paraclinoid ICA -Carotid Duplex--neg for hemodynamically significant stenosis -Echo--EF 60-65%, G1 DD, no PFO -LDL--87 -HbA1C--7.1 -Antiplatelet--ASA 325 mg  Essential hypertension -Holding ramipril>>>restart after d/c -Allow for permissive hypertension during hospitalization  Diabetes mellitus type 2 -Holding metformin -03/23/2020 hemoglobin A1c 7.1 -NovoLog sliding scale  CKD stage IIIb -Baseline creatinine 1.3-1.6  Acute metabolic encephalopathy -3/25--spouse states pt near baseline -UA negative for pyuria -TSH 0.915 -Serum  B12--941 -Folic acid--15.2 -Ammonia--20  BPH -Continue tamsulosin   Discharge Instructions   Allergies as of 03/25/2020      Reactions   Sulfa Antibiotics Nausea And Vomiting      Medication List    TAKE these medications   ACIDOPHILUS PO Take 1 capsule by mouth daily.   aspirin 325 MG tablet Take 1 tablet (325 mg total) by mouth daily. Start taking on: March 26, 2020   atorvastatin 10 MG tablet Commonly known as: LIPITOR Take 1 tablet (10 mg total) by mouth daily at 6 PM.   GINKOBA PO Take 1 tablet by mouth daily.   Magnesium 250 MG Tabs Take 1 tablet by mouth daily.   metFORMIN 500 MG tablet Commonly known as: GLUCOPHAGE Take 500 mg by mouth daily with breakfast.   omeprazole 40 MG capsule Commonly known as: PRILOSEC Take 40 mg by mouth daily.   ramipril 2.5 MG capsule Commonly known as: ALTACE Take 2.5 mg by mouth daily.   SAW PALMETTO PO Take 1 capsule by mouth daily.   tamsulosin 0.4 MG Caps capsule Commonly known as: FLOMAX Take 0.4 mg by mouth daily.   vitamin B-12 1000 MCG tablet Commonly known as: CYANOCOBALAMIN Take 1,000 mcg by mouth daily.   Vitamin D 50 MCG (2000 UT) Caps Take 1 capsule by mouth daily.   vitamin E 180 MG (400 UNITS) capsule Take 400 Units by mouth daily.       Allergies  Allergen Reactions  . Sulfa Antibiotics Nausea And Vomiting    Consultations:  neurology   Procedures/Studies: CT HEAD WO CONTRAST  Result Date: 03/22/2020 CLINICAL DATA:  Confusion EXAM: CT HEAD WITHOUT CONTRAST TECHNIQUE: Contiguous axial images were obtained from the base of the skull through the vertex without intravenous contrast. COMPARISON:  None. FINDINGS: Brain: No evidence of acute infarction, hemorrhage, hydrocephalus, extra-axial collection  or mass lesion/mass effect. Focal encephalomalacia from lacunar infarct in the right thalamus. Additional small lacunar infarcts in the left corona radiata. Mild-moderate low-density changes  within the periventricular and subcortical white matter compatible with chronic microvascular ischemic change. Mild diffuse cerebral volume loss. Vascular: Atherosclerotic calcifications involving the large vessels of the skull base. No unexpected hyperdense vessel. Skull: Normal. Negative for fracture or focal lesion. Sinuses/Orbits: No acute finding. Other: None. IMPRESSION: 1. No acute intracranial findings. 2. Chronic microvascular ischemic changes and cerebral volume loss. 3. Focal encephalomalacia from remote lacunar infarct in the right thalamus. Additional small remote lacunar infarcts in the left corona radiata. Electronically Signed   By: Duanne Guess D.O.   On: 03/22/2020 11:07   MR ANGIO HEAD WO CONTRAST  Result Date: 03/22/2020 CLINICAL DATA:  Confusion EXAM: MRI HEAD WITHOUT CONTRAST MRA HEAD WITHOUT CONTRAST TECHNIQUE: Multiplanar, multiecho pulse sequences of the brain and surrounding structures were obtained without intravenous contrast. Angiographic images of the head were obtained using MRA technique without contrast. COMPARISON:  None. FINDINGS: MRI HEAD FINDINGS Brain: There is a 1.2 cm area of restricted diffusion within the lateral right thalamus. No evidence of intracranial hemorrhage. Patchy and confluent areas of T2 hyperintensity in the supratentorial white matter nonspecific but probably reflect mild to moderate chronic microvascular ischemic changes. There are chronic small vessel infarcts of the left frontal white matter, left caudate, and left cerebellum. Prominence of the ventricles and sulci reflects mild generalized parenchymal volume loss. There is no intracranial mass, mass effect, or edema. There is no hydrocephalus or extra-axial fluid collection. Vascular: Major vessel flow voids at the skull base are preserved. Skull and upper cervical spine: Normal marrow signal is preserved. Sinuses/Orbits: Minor mucosal thickening.  Orbits are unremarkable. Other: Sella is  unremarkable.  Mastoid air cells are clear. MRA HEAD FINDINGS Intracranial internal carotid arteries are patent. There is moderate to marked stenosis of the right paraclinoid ICA. Middle and anterior cerebral arteries are patent. 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 Intracranial vertebral arteries, basilar artery, posterior cerebral arteries are patent. Right posterior communicating artery is present with fetal origin of the right PCA. There is no significant stenosis or aneurysm. IMPRESSION: Acute right thalamic infarct. Chronic microvascular ischemic changes and chronic small vessel infarcts as described. 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 Electronically Signed   By: Guadlupe Spanish M.D.   On: 03/22/2020 13:02   MR BRAIN WO CONTRAST  Result Date: 03/22/2020 CLINICAL DATA:  Confusion EXAM: MRI HEAD WITHOUT CONTRAST MRA HEAD WITHOUT CONTRAST TECHNIQUE: Multiplanar, multiecho pulse sequences of the brain and surrounding structures were obtained without intravenous contrast. Angiographic images of the head were obtained using MRA technique without contrast. COMPARISON:  None. FINDINGS: MRI HEAD FINDINGS Brain: There is a 1.2 cm area of restricted diffusion within the lateral right thalamus. No evidence of intracranial hemorrhage. Patchy and confluent areas of T2 hyperintensity in the supratentorial white matter nonspecific but probably reflect mild to moderate chronic microvascular ischemic changes. There are chronic small vessel infarcts of the left frontal white matter, left caudate, and left cerebellum. Prominence of the ventricles and sulci reflects mild generalized parenchymal volume loss. There is no intracranial mass, mass effect, or edema. There is no hydrocephalus or extra-axial fluid collection. Vascular: Major vessel flow voids at the skull base are preserved. Skull and upper cervical spine: Normal marrow signal is preserved. Sinuses/Orbits: Minor mucosal  thickening.  Orbits are unremarkable. Other: Sella is unremarkable.  Mastoid air  cells are clear. MRA HEAD FINDINGS Intracranial internal carotid arteries are patent. There is moderate to marked stenosis of the right paraclinoid ICA. Middle and anterior cerebral arteries are patent. 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 Intracranial vertebral arteries, basilar artery, posterior cerebral arteries are patent. Right posterior communicating artery is present with fetal origin of the right PCA. There is no significant stenosis or aneurysm. IMPRESSION: Acute right thalamic infarct. Chronic microvascular ischemic changes and chronic small vessel infarcts as described. 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 Electronically Signed   By: Guadlupe Spanish M.D.   On: 03/22/2020 13:02   US Carotid Bilateral (at [facility] and [facility] only)  Result Date: 03/23/2020 CLINICAL DATA:  Altered mental status. Left hand numbness. Acute right thalamic infarct based on MRI. EXAM: BILATERAL CAROTID DUPLEX ULTRASOUND TECHNIQUE: Wallace Cullens scale imaging, color Doppler and duplex ultrasound were performed of bilateral carotid and vertebral arteries in the neck. COMPARISON:  None FINDINGS: Criteria: Quantification of carotid stenosis is based on velocity parameters that correlate the residual internal carotid diameter with NASCET-based stenosis levels, using the diameter of the distal internal carotid lumen as the denominator for stenosis measurement. The following velocity measurements were obtained: RIGHT ICA: 76/16 cm/sec CCA: 76/12 cm/sec SYSTOLIC ICA/CCA RATIO:  1.0 ECA: 84 cm/sec LEFT ICA: 90/24 cm/sec CCA: 96/17 cm/sec SYSTOLIC ICA/CCA RATIO:  0.9 ECA: 111 cm/sec RIGHT CAROTID ARTERY: Small amount of circumferential plaque at the right carotid bulb. External carotid artery is patent with normal waveform. Heterogeneous and echogenic plaque in the proximal internal carotid artery. Normal waveforms and velocities in the  internal carotid artery. RIGHT VERTEBRAL ARTERY: Antegrade flow and normal waveform in the right vertebral artery. LEFT CAROTID ARTERY: Small amount of plaque at the left carotid bulb and origin of the internal and external carotid arteries. External carotid artery is patent with normal waveform. Combination of heterogeneous and echogenic plaque in the proximal internal carotid artery. Normal waveforms and velocities in the internal carotid artery. LEFT VERTEBRAL ARTERY: Antegrade flow and normal waveform in the left vertebral artery. IMPRESSION: Atherosclerotic plaque at the carotid bulbs and proximal internal carotid arteries bilaterally. Estimated degree of stenosis in the internal carotid arteries is less than 50% bilaterally. Patent vertebral arteries with antegrade flow. Electronically Signed   By: Richarda Overlie M.D.   On: 03/23/2020 07:54   DG Chest Port 1 View  Result Date: 03/22/2020 CLINICAL DATA:  86 year old male with history of trauma from a fall yesterday complaining of left anterior chest pain. EXAM: PORTABLE CHEST 1 VIEW COMPARISON:  Chest x-ray 01/30/2019. FINDINGS: Lung volumes are normal. No consolidative airspace disease. No pleural effusions. No pneumothorax. No pulmonary nodule or mass noted. Pulmonary vasculature and the cardiomediastinal silhouette are within normal limits. Atherosclerosis in the thoracic aorta. No definite acute displaced left-sided rib fractures are noted. IMPRESSION: 1. No acute displaced left-sided rib fractures and no radiographic evidence of acute cardiopulmonary disease. 2. Aortic atherosclerosis. Electronically Signed   By: Trudie Reed M.D.   On: 03/22/2020 10:57   ECHOCARDIOGRAM COMPLETE  Result Date: 03/23/2020    ECHOCARDIOGRAM REPORT   Patient Name:   Ramone Gander. Date of Exam: 03/23/2020 Medical Rec #:  [id number]       Height:       68.0 in Accession #:    [id number]      Weight:       130.0 lb Date of Birth:  [birth year]       BSA:  1.702 m  Patient Age:    85 years        BP:           114/63 mmHg Patient Gender: M               HR:           89 bpm. [facility] Procedure: 2D Echo, Cardiac Doppler and Color Doppler Indications:    Stroke 434.91 / I163.9  History:        Patient has no prior history of Echocardiogram examinations.                 Risk Factors:Diabetes and Hypertension. GERD.  Sonographer:    Celesta Gentile RCS Referring Phys: [phone] CARLOS MADERA IMPRESSIONS  1. Left ventricular ejection fraction, by estimation, is 60 to 65%. The left ventricle has normal function. The left ventricle has no regional wall motion abnormalities. Left ventricular diastolic parameters are consistent with Grade I diastolic dysfunction (impaired relaxation).  2. Right ventricular systolic function is normal. The right ventricular size is normal. There is normal pulmonary artery systolic pressure.  3. The mitral valve is normal in structure. Trivial mitral valve regurgitation. No evidence of mitral stenosis.  4. The aortic valve is tricuspid. Aortic valve regurgitation is mild. No aortic stenosis is present.  5. The inferior vena cava is normal in size with greater than 50% respiratory variability, suggesting right atrial pressure of 3 mmHg. FINDINGS  Left Ventricle: Left ventricular ejection fraction, by estimation, is 60 to 65%. The left ventricle has normal function. The left ventricle has no regional wall motion abnormalities. The left ventricular internal cavity size was normal in size. There is  no left ventricular hypertrophy. Left ventricular diastolic parameters are consistent with Grade I diastolic dysfunction (impaired relaxation). Normal left ventricular filling pressure. Right Ventricle: The right ventricular size is normal. No increase in right ventricular wall thickness. Right ventricular systolic function is normal. There is normal pulmonary artery systolic pressure. The tricuspid regurgitant velocity is 2.37 m/s, and  with an  assumed right atrial pressure of 3 mmHg, the estimated right ventricular systolic pressure is 25.5 mmHg. Left Atrium: Left atrial size was normal in size. Right Atrium: Right atrial size was normal in size. Pericardium: There is no evidence of pericardial effusion. Mitral Valve: The mitral valve is normal in structure. Trivial mitral valve regurgitation. No evidence of mitral valve stenosis. Tricuspid Valve: The tricuspid valve is normal in structure. Tricuspid valve regurgitation is mild . No evidence of tricuspid stenosis. Aortic Valve: The aortic valve is tricuspid. Aortic valve regurgitation is mild. No aortic stenosis is present. Aortic valve mean gradient measures 2.3 mmHg. Aortic valve peak gradient measures 4.5 mmHg. Aortic valve area, by VTI measures 2.79 cm. Pulmonic Valve: The pulmonic valve was not well visualized. Pulmonic valve regurgitation is not visualized. No evidence of pulmonic stenosis. Aorta: The aortic root is normal in size and structure. Venous: The inferior vena cava is normal in size with greater than 50% respiratory variability, suggesting right atrial pressure of 3 mmHg. IAS/Shunts: No atrial level shunt detected by color flow Doppler.  LEFT VENTRICLE PLAX 2D LVIDd:         3.21 cm     Diastology LVIDs:         2.40 cm     LV e' lateral:   7.94 cm/s LV PW:         0.93 cm     LV E/e' lateral:  5.5 LV IVS:        0.90 cm     LV e' medial:    4.35 cm/s LVOT diam:     2.00 cm     LV E/e' medial:  10.0 LV SV:         51 LV SV Index:   30 LVOT Area:     3.14 cm  LV Volumes (MOD) LV vol d, MOD A2C: 40.1 ml LV vol d, MOD A4C: 51.1 ml LV vol s, MOD A2C: 17.1 ml LV vol s, MOD A4C: 24.3 ml LV SV MOD A2C:     23.0 ml LV SV MOD A4C:     51.1 ml LV SV MOD BP:      23.4 ml RIGHT VENTRICLE RV Mid diam:    2.61 cm RV S prime:     10.00 cm/s TAPSE (M-mode): 1.4 cm LEFT ATRIUM             Index LA diam:        3.80 cm 2.23 cm/m LA Vol (A2C):   41.9 ml 24.62 ml/m LA Vol (A4C):   34.1 ml 20.04 ml/m LA  Biplane Vol: 38.6 ml 22.68 ml/m  AORTIC VALVE AV Area (Vmax):    2.64 cm AV Area (Vmean):   2.95 cm AV Area (VTI):     2.79 cm AV Vmax:           106.19 cm/s AV Vmean:          70.969 cm/s AV VTI:            0.182 m AV Peak Grad:      4.5 mmHg AV Mean Grad:      2.3 mmHg LVOT Vmax:         89.40 cm/s LVOT Vmean:        66.700 cm/s LVOT VTI:          0.162 m LVOT/AV VTI ratio: 0.89  AORTA Ao Root diam: 3.00 cm MITRAL VALVE                TRICUSPID VALVE MV Area (PHT): 3.30 cm     TR Peak grad:   22.5 mmHg MV Decel Time: 230 msec     TR Vmax:        237.00 cm/s MV E velocity: 43.70 cm/s MV A velocity: 104.00 cm/s  SHUNTS MV E/A ratio:  0.42         Systemic VTI:  0.16 m                             Systemic Diam: 2.00 cm Dina Rich MD Electronically signed by Dina Rich MD Signature Date/Time: 03/23/2020/3:25:06 PM    Final          Discharge Exam: Vitals:   03/24/20 2112 03/25/20 0458  BP: 131/69 131/71  Pulse: 94 95  Resp: 20 18  Temp: 98.8 F (37.1 C) 98 F (36.7 C)  SpO2: 100% 96%   Vitals:   03/24/20 1000 03/24/20 1510 03/24/20 2112 03/25/20 0458  BP: 140/75 120/70 131/69 131/71  Pulse: 86 90 94 95  Resp: Temp: 98.4 F (36.9 C) 97.8 F (36.6 C) 98.8 F (37.1 C) 98 F (36.7 C)  TempSrc: Oral Oral Oral Oral  SpO2: 97% 96% 100% 96%  Weight:      Height:        General: Pt is alert,  awake, not in acute distress Cardiovascular: RRR, S1/S2 +, no rubs, no gallops Respiratory: CTA bilaterally, no wheezing, no rhonchi Abdominal: Soft, NT, ND, bowel sounds + Extremities: no edema, no cyanosis   The results of significant diagnostics from this hospitalization (including imaging, microbiology, ancillary and laboratory) are listed below for reference.    Significant Diagnostic Studies: CT HEAD WO CONTRAST  Result Date: 03/22/2020 CLINICAL DATA:  Confusion EXAM: CT HEAD WITHOUT CONTRAST TECHNIQUE: Contiguous axial images were obtained from the base of the  skull through the vertex without intravenous contrast. COMPARISON:  None. FINDINGS: Brain: No evidence of acute infarction, hemorrhage, hydrocephalus, extra-axial collection or mass lesion/mass effect. Focal encephalomalacia from lacunar infarct in the right thalamus. Additional small lacunar infarcts in the left corona radiata. Mild-moderate low-density changes within the periventricular and subcortical white matter compatible with chronic microvascular ischemic change. Mild diffuse cerebral volume loss. Vascular: Atherosclerotic calcifications involving the large vessels of the skull base. No unexpected hyperdense vessel. Skull: Normal. Negative for fracture or focal lesion. Sinuses/Orbits: No acute finding. Other: None. IMPRESSION: 1. No acute intracranial findings. 2. Chronic microvascular ischemic changes and cerebral volume loss. 3. Focal encephalomalacia from remote lacunar infarct in the right thalamus. Additional small remote lacunar infarcts in the left corona radiata. Electronically Signed   By: Duanne Guess D.O.   On: 03/22/2020 11:07   MR ANGIO HEAD WO CONTRAST  Result Date: 03/22/2020 CLINICAL DATA:  Confusion EXAM: MRI HEAD WITHOUT CONTRAST MRA HEAD WITHOUT CONTRAST TECHNIQUE: Multiplanar, multiecho pulse sequences of the brain and surrounding structures were obtained without intravenous contrast. Angiographic images of the head were obtained using MRA technique without contrast. COMPARISON:  None. FINDINGS: MRI HEAD FINDINGS Brain: There is a 1.2 cm area of restricted diffusion within the lateral right thalamus. No evidence of intracranial hemorrhage. Patchy and confluent areas of T2 hyperintensity in the supratentorial white matter nonspecific but probably reflect mild to moderate chronic microvascular ischemic changes. There are chronic small vessel infarcts of the left frontal white matter, left caudate, and left cerebellum. Prominence of the ventricles and sulci reflects mild generalized  parenchymal volume loss. There is no intracranial mass, mass effect, or edema. There is no hydrocephalus or extra-axial fluid collection. Vascular: Major vessel flow voids at the skull base are preserved. Skull and upper cervical spine: Normal marrow signal is preserved. Sinuses/Orbits: Minor mucosal thickening.  Orbits are unremarkable. Other: Sella is unremarkable.  Mastoid air cells are clear. MRA HEAD FINDINGS Intracranial internal carotid arteries are patent. There is moderate to marked stenosis of the right paraclinoid ICA. Middle and anterior cerebral arteries are patent. 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 Intracranial vertebral arteries, basilar artery, posterior cerebral arteries are patent. Right posterior communicating artery is present with fetal origin of the right PCA. There is no significant stenosis or aneurysm. IMPRESSION: Acute right thalamic infarct. Chronic microvascular ischemic changes and chronic small vessel infarcts as described. 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 Electronically Signed   By: Guadlupe Spanish M.D.   On: 03/22/2020 13:02   MR BRAIN WO CONTRAST  Result Date: 03/22/2020 CLINICAL DATA:  Confusion EXAM: MRI HEAD WITHOUT CONTRAST MRA HEAD WITHOUT CONTRAST TECHNIQUE: Multiplanar, multiecho pulse sequences of the brain and surrounding structures were obtained without intravenous contrast. Angiographic images of the head were obtained using MRA technique without contrast. COMPARISON:  None. FINDINGS: MRI HEAD FINDINGS Brain: There is a 1.2 cm area of restricted diffusion within the lateral right thalamus. No evidence of  intracranial hemorrhage. Patchy and confluent areas of T2 hyperintensity in the supratentorial white matter nonspecific but probably reflect mild to moderate chronic microvascular ischemic changes. There are chronic small vessel infarcts of the left frontal white matter, left caudate, and left cerebellum. Prominence of the  ventricles and sulci reflects mild generalized parenchymal volume loss. There is no intracranial mass, mass effect, or edema. There is no hydrocephalus or extra-axial fluid collection. Vascular: Major vessel flow voids at the skull base are preserved. Skull and upper cervical spine: Normal marrow signal is preserved. Sinuses/Orbits: Minor mucosal thickening.  Orbits are unremarkable. Other: Sella is unremarkable.  Mastoid air cells are clear. MRA HEAD FINDINGS Intracranial internal carotid arteries are patent. There is moderate to marked stenosis of the right paraclinoid ICA. Middle and anterior cerebral arteries are patent. 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 Intracranial vertebral arteries, basilar artery, posterior cerebral arteries are patent. Right posterior communicating artery is present with fetal origin of the right PCA. There is no significant stenosis or aneurysm. IMPRESSION: Acute right thalamic infarct. Chronic microvascular ischemic changes and chronic small vessel infarcts as described. 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 Electronically Signed   By: Macy Mis M.D.   On: 03/22/2020 13:02   US Carotid Bilateral (at [facility] and [facility] only)  Result Date: 03/23/2020 CLINICAL DATA:  Altered mental status. Left hand numbness. Acute right thalamic infarct based on MRI. EXAM: BILATERAL CAROTID DUPLEX ULTRASOUND TECHNIQUE: Pearline Cables scale imaging, color Doppler and duplex ultrasound were performed of bilateral carotid and vertebral arteries in the neck. COMPARISON:  None FINDINGS: Criteria: Quantification of carotid stenosis is based on velocity parameters that correlate the residual internal carotid diameter with NASCET-based stenosis levels, using the diameter of the distal internal carotid lumen as the denominator for stenosis measurement. The following velocity measurements were obtained: RIGHT ICA: 76/16 cm/sec CCA: 96/78 cm/sec SYSTOLIC ICA/CCA RATIO:  1.0 ECA: 84  cm/sec LEFT ICA: 90/24 cm/sec CCA: 93/81 cm/sec SYSTOLIC ICA/CCA RATIO:  0.9 ECA: 111 cm/sec RIGHT CAROTID ARTERY: Small amount of circumferential plaque at the right carotid bulb. External carotid artery is patent with normal waveform. Heterogeneous and echogenic plaque in the proximal internal carotid artery. Normal waveforms and velocities in the internal carotid artery. RIGHT VERTEBRAL ARTERY: Antegrade flow and normal waveform in the right vertebral artery. LEFT CAROTID ARTERY: Small amount of plaque at the left carotid bulb and origin of the internal and external carotid arteries. External carotid artery is patent with normal waveform. Combination of heterogeneous and echogenic plaque in the proximal internal carotid artery. Normal waveforms and velocities in the internal carotid artery. LEFT VERTEBRAL ARTERY: Antegrade flow and normal waveform in the left vertebral artery. IMPRESSION: Atherosclerotic plaque at the carotid bulbs and proximal internal carotid arteries bilaterally. Estimated degree of stenosis in the internal carotid arteries is less than 50% bilaterally. Patent vertebral arteries with antegrade flow. Electronically Signed   By: Markus Daft M.D.   On: 03/23/2020 07:54   DG Chest Port 1 View  Result Date: 03/22/2020 CLINICAL DATA:  86 year old male with history of trauma from a fall yesterday complaining of left anterior chest pain. EXAM: PORTABLE CHEST 1 VIEW COMPARISON:  Chest x-ray 01/30/2019. FINDINGS: Lung volumes are normal. No consolidative airspace disease. No pleural effusions. No pneumothorax. No pulmonary nodule or mass noted. Pulmonary vasculature and the cardiomediastinal silhouette are within normal limits. Atherosclerosis in the thoracic aorta. No definite acute displaced left-sided rib fractures are noted. IMPRESSION: 1. No acute displaced  left-sided rib fractures and no radiographic evidence of acute cardiopulmonary disease. 2. Aortic atherosclerosis. Electronically Signed    By: Trudie Reed M.D.   On: 03/22/2020 10:57   ECHOCARDIOGRAM COMPLETE  Result Date: 03/23/2020    ECHOCARDIOGRAM REPORT   Patient Name:   Jamale Spangler. Date of Exam: 03/23/2020 Medical Rec #:  [id number]       Height:       68.0 in Accession #:    [id number]      Weight:       130.0 lb Date of Birth:  30-Sep-1934       BSA:          1.702 m Patient Age:    85 years        BP:           114/63 mmHg Patient Gender: M               HR:           89 bpm. [facility] Procedure: 2D Echo, Cardiac Doppler and Color Doppler Indications:    Stroke 434.91 / I163.9  History:        Patient has no prior history of Echocardiogram examinations.                 Risk Factors:Diabetes and Hypertension. GERD.  Sonographer:    Celesta Gentile RCS Referring Phys: [phone] CARLOS MADERA IMPRESSIONS  1. Left ventricular ejection fraction, by estimation, is 60 to 65%. The left ventricle has normal function. The left ventricle has no regional wall motion abnormalities. Left ventricular diastolic parameters are consistent with Grade I diastolic dysfunction (impaired relaxation).  2. Right ventricular systolic function is normal. The right ventricular size is normal. There is normal pulmonary artery systolic pressure.  3. The mitral valve is normal in structure. Trivial mitral valve regurgitation. No evidence of mitral stenosis.  4. The aortic valve is tricuspid. Aortic valve regurgitation is mild. No aortic stenosis is present.  5. The inferior vena cava is normal in size with greater than 50% respiratory variability, suggesting right atrial pressure of 3 mmHg. FINDINGS  Left Ventricle: Left ventricular ejection fraction, by estimation, is 60 to 65%. The left ventricle has normal function. The left ventricle has no regional wall motion abnormalities. The left ventricular internal cavity size was normal in size. There is  no left ventricular hypertrophy. Left ventricular diastolic parameters are consistent with Grade I  diastolic dysfunction (impaired relaxation). Normal left ventricular filling pressure. Right Ventricle: The right ventricular size is normal. No increase in right ventricular wall thickness. Right ventricular systolic function is normal. There is normal pulmonary artery systolic pressure. The tricuspid regurgitant velocity is 2.37 m/s, and  with an assumed right atrial pressure of 3 mmHg, the estimated right ventricular systolic pressure is 25.5 mmHg. Left Atrium: Left atrial size was normal in size. Right Atrium: Right atrial size was normal in size. Pericardium: There is no evidence of pericardial effusion. Mitral Valve: The mitral valve is normal in structure. Trivial mitral valve regurgitation. No evidence of mitral valve stenosis. Tricuspid Valve: The tricuspid valve is normal in structure. Tricuspid valve regurgitation is mild . No evidence of tricuspid stenosis. Aortic Valve: The aortic valve is tricuspid. Aortic valve regurgitation is mild. No aortic stenosis is present. Aortic valve mean gradient measures 2.3 mmHg. Aortic valve peak gradient measures 4.5 mmHg. Aortic valve area, by VTI measures 2.79 cm. Pulmonic Valve: The pulmonic valve was not well  visualized. Pulmonic valve regurgitation is not visualized. No evidence of pulmonic stenosis. Aorta: The aortic root is normal in size and structure. Venous: The inferior vena cava is normal in size with greater than 50% respiratory variability, suggesting right atrial pressure of 3 mmHg. IAS/Shunts: No atrial level shunt detected by color flow Doppler.  LEFT VENTRICLE PLAX 2D LVIDd:         3.21 cm     Diastology LVIDs:         2.40 cm     LV e' lateral:   7.94 cm/s LV PW:         0.93 cm     LV E/e' lateral: 5.5 LV IVS:        0.90 cm     LV e' medial:    4.35 cm/s LVOT diam:     2.00 cm     LV E/e' medial:  10.0 LV SV:         51 LV SV Index:   30 LVOT Area:     3.14 cm  LV Volumes (MOD) LV vol d, MOD A2C: 40.1 ml LV vol d, MOD A4C: 51.1 ml LV vol s, MOD  A2C: 17.1 ml LV vol s, MOD A4C: 24.3 ml LV SV MOD A2C:     23.0 ml LV SV MOD A4C:     51.1 ml LV SV MOD BP:      23.4 ml RIGHT VENTRICLE RV Mid diam:    2.61 cm RV S prime:     10.00 cm/s TAPSE (M-mode): 1.4 cm LEFT ATRIUM             Index LA diam:        3.80 cm 2.23 cm/m LA Vol (A2C):   41.9 ml 24.62 ml/m LA Vol (A4C):   34.1 ml 20.04 ml/m LA Biplane Vol: 38.6 ml 22.68 ml/m  AORTIC VALVE AV Area (Vmax):    2.64 cm AV Area (Vmean):   2.95 cm AV Area (VTI):     2.79 cm AV Vmax:           106.19 cm/s AV Vmean:          70.969 cm/s AV VTI:            0.182 m AV Peak Grad:      4.5 mmHg AV Mean Grad:      2.3 mmHg LVOT Vmax:         89.40 cm/s LVOT Vmean:        66.700 cm/s LVOT VTI:          0.162 m LVOT/AV VTI ratio: 0.89  AORTA Ao Root diam: 3.00 cm MITRAL VALVE                TRICUSPID VALVE MV Area (PHT): 3.30 cm     TR Peak grad:   22.5 mmHg MV Decel Time: 230 msec     TR Vmax:        237.00 cm/s MV E velocity: 43.70 cm/s MV A velocity: 104.00 cm/s  SHUNTS MV E/A ratio:  0.42         Systemic VTI:  0.16 m                             Systemic Diam: 2.00 cm Dina Rich MD Electronically signed by Dina Rich MD Signature Date/Time: 03/23/2020/3:25:06 PM    Final      Microbiology: Recent Results (  from the past 240 hour(s))  SARS CORONAVIRUS 2 ([place] 6-24 HRS) Nasopharyngeal Nasopharyngeal Swab     Status: None   Collection Time: 03/22/20  2:40 PM   Specimen: Nasopharyngeal Swab  Result Value Ref Range Status   SARS Coronavirus 2 NEGATIVE NEGATIVE Final    Comment: (NOTE) SARS-CoV-2 target nucleic acids are NOT DETECTED. The SARS-CoV-2 RNA is generally detectable in upper and lower respiratory specimens during the acute phase of infection. Negative results do not preclude SARS-CoV-2 infection, do not rule out co-infections with other pathogens, and should not be used as the sole basis for treatment or other patient management decisions. Negative results must be combined with clinical  observations, patient history, and epidemiological information. The expected result is Negative. Fact Sheet for Patients: HairSlick.no Fact Sheet for Healthcare Providers: quierodirigir.com This test is not yet approved or cleared by the [place] FDA and  has been authorized for detection and/or diagnosis of SARS-CoV-2 by FDA under an Emergency Use Authorization (EUA). This EUA will remain  in effect (meaning this test can be used) for the duration of the COVID-19 declaration under Section 56 4(b)(1) of the Act, 21 U.S.C. section 360bbb-3(b)(1), unless the authorization is terminated or revoked sooner. Performed at [facility], [address], [place], [place] [address]      Labs: Basic Metabolic Panel: Recent Labs  Lab 03/22/20 1028 03/22/20 1028 03/22/20 1031 03/23/20 0532  NA 139  --  139 138  K 4.4   < > 4.2 4.5  CL 102  --  103 102  CO2  --   --  27 27  GLUCOSE 132*  --  135* 126*  BUN 27*  --  29* 26*  CREATININE 1.60*  --  1.49* 1.65*  CALCIUM  --   --  9.3 9.0   < > = values in this interval not displayed.   Liver Function Tests: Recent Labs  Lab 03/22/20 1031  AST 30  ALT 22  ALKPHOS 128*  BILITOT 0.8  PROT 6.8  ALBUMIN 4.1   No results for input(s): LIPASE, AMYLASE in the last 168 hours. Recent Labs  Lab 03/23/20 0749  AMMONIA 20   CBC: Recent Labs  Lab 03/22/20 1028 03/22/20 1031 03/23/20 0532  WBC  --  7.2 6.9  NEUTROABS  --  5.1  --   HGB 13.6 13.1 13.2  HCT 40.0 40.2 40.7  MCV  --  99.5 99.3  PLT  --  162 174   Cardiac Enzymes: No results for input(s): CKTOTAL, CKMB, CKMBINDEX, TROPONINI in the last 168 hours. BNP: Invalid input(s): POCBNP CBG: Recent Labs  Lab 03/24/20 0754 03/24/20 1141 03/24/20 1713 03/24/20 2109 03/25/20 0743  GLUCAP 125* 154* 136* 249* 119*    Time coordinating discharge:  36 minutes  Signed:  Catarina Hartshorn, DO [facility] Pager: [phone] 03/25/2020, 9:38 AM

## 2020-03-25 NOTE — Care Management Important Message (Signed)
Important Message  Patient Details  Name: Martin Frazier. MRN: [id number] Date of Birth: Nov 02, 1934   Medicare Important Message Given:  Yes     Corey Harold 03/25/2020, 1:16 PM

## 2020-03-25 NOTE — Progress Notes (Signed)
Pt arrived to unit via EMS, pt is A&O and able to make needs known, skin has bruising from falls prior to admission, impaired hearing (bilateral hearing aids) as well as impaired vision (wears glasses but doesn't have with him. No personal items transferred to rehab. Pt oriented to rehab.

## 2020-03-25 NOTE — IPOC Note (Addendum)
Individualized overall Plan of Care (IPOC) Patient Details Name: Martin Frazier. MRN: [id number] DOB: May 15, 1934  Admitting Diagnosis: Right thalamic infarction [facility])  Hospital Problems: Principal Problem:   Right thalamic infarction [facility]) Active Problems:   AKI (acute kidney injury) ([place])   Controlled type 2 diabetes mellitus with hyperglycemia, without long-term current use of insulin (HCC)   History of hypertension      Functional Problem List: Nursing Bladder, Bowel, Medication Management  PT Balance, Perception, Behavior, Safety, Sensory, Endurance, Skin Integrity, Edema, Motor, Nutrition, Pain  OT Balance, Motor, Sensory  SLP    TR         Basic ADL's: OT Grooming, Bathing, Dressing, Toileting     Advanced  ADL's: OT       Transfers: PT Bed Mobility, Bed to Chair, Car, Manufacturing systems engineer, Metallurgist: PT Ambulation, Stairs     Additional Impairments: OT Fuctional Use of Upper Extremity  SLP        TR      Anticipated Outcomes Item Anticipated Outcome  Self Feeding independent  Swallowing      Basic self-care  supervision  Toileting  supervision   Bathroom Transfers supervision  Bowel/Bladder  To be continent x 2 LBM 03/24/20 (small)  Transfers  supervision using LRAD  Locomotion  supervision using LRAD  Communication     Cognition     Pain  less than 3  Safety/Judgment  To remain free of falls while in rehab   Therapy Plan: PT Intensity: Minimum of 1-2 x/day ,45 to 90 minutes PT Frequency: 5 out of 7 days PT Duration Estimated Length of Stay: ~1.5-2 weeks OT Intensity: Minimum of 1-2 x/day, 45 to 90 minutes OT Frequency: 5 out of 7 days OT Duration/Estimated Length of Stay: 10-12 days      Team Interventions: Nursing Interventions Patient/Family Education, Disease Management/Prevention, Discharge Planning, Bladder Management, Bowel Management  PT interventions Ambulation/gait training, Community reintegration,  DME/adaptive equipment instruction, Neuromuscular re-education, Psychosocial support, Stair training, UE/LE Strength taining/ROM, Training and development officer, Discharge planning, Functional electrical stimulation, Pain management, Skin care/wound management, Therapeutic Activities, UE/LE Coordination activities, Cognitive remediation/compensation, Disease management/prevention, Functional mobility training, Patient/family education, Therapeutic Exercise, Visual/perceptual remediation/compensation, Splinting/orthotics  OT Interventions Balance/vestibular training, Discharge planning, Self Care/advanced ADL retraining, Pain management, Therapeutic Activities, UE/LE Coordination activities, Functional mobility training, Patient/family education, Therapeutic Exercise, Community reintegration, Engineer, drilling, Neuromuscular re-education, UE/LE Strength taining/ROM  SLP Interventions    TR Interventions    SW/CM Interventions Discharge Planning, Psychosocial Support, Patient/Family Education   Barriers to Discharge MD  Medical stability and right rotator cuff injury  Nursing      PT Decreased caregiver support, Home environment access/layout    OT      SLP      SW       Team Discharge Planning: Destination: PT-Home ,OT- Home , SLP-  Projected Follow-up: PT-Home health PT, 24 hour supervision/assistance, Outpatient PT(TBD pending pt progress), OT-  Home health OT, 24 hour supervision/assistance, SLP-  Projected Equipment Needs: PT-To be determined, OT- To be determined, SLP-  Equipment Details: PT-has RW, OT-  Patient/family involved in discharge planning: PT- Patient,  OT-Patient, Family member/caregiver, SLP-   MD ELOS: 8-12 days Medical Rehab Prognosis:  Good Assessment: 86 year old right-handed male with history of diabetes mellitus, hypertension and recently stopped smoking, CKD stage III as well as BPH.  He presented on 03/22/20 to [facility] with left  hemiparesis and gait instability. Admission chemistries BUN  22, creatinine 1.49, urine drug screen negative, urinalysis negative, hemoglobin A1c 7.1.  CT/MRI showed acute right thalamic infarction. Patient did not receive TPA.  MRA of the head showed chronic microvascular ischemic changes with moderate to marked focal stenosis of the right paraclinoid ICA.  Echocardiogram with ejection fraction of 65% without embolus.  Carotid Dopplers atherosclerotic plaque at the carotid bulbs and proximal internal carotid arteries bilaterally.  Estimated degree of stenosis in the internal carotid arteries is less than 50% bilaterally.  Follow-up neurology services Dr.[place] maintain on aspirin 325 mg daily.  Patient with resulting functional deficits with mobility, endurance, self-care.  Will set goals for supervision with PT/OT.     Due to the current state of emergency, patients may not be receiving their 3-hours of Medicare-mandated therapy.  See Team Conference Notes for weekly updates to the plan of care

## 2020-03-26 ENCOUNTER — Inpatient Hospital Stay (HOSPITAL_COMMUNITY): Payer: Medicare Other | Admitting: Occupational Therapy

## 2020-03-26 ENCOUNTER — Inpatient Hospital Stay (HOSPITAL_COMMUNITY): Payer: Medicare Other | Admitting: Physical Therapy

## 2020-03-26 DIAGNOSIS — N179 Acute kidney failure, unspecified: Secondary | ICD-10-CM

## 2020-03-26 DIAGNOSIS — Z8679 Personal history of other diseases of the circulatory system: Secondary | ICD-10-CM

## 2020-03-26 DIAGNOSIS — N400 Enlarged prostate without lower urinary tract symptoms: Secondary | ICD-10-CM

## 2020-03-26 DIAGNOSIS — E1165 Type 2 diabetes mellitus with hyperglycemia: Secondary | ICD-10-CM

## 2020-03-26 DIAGNOSIS — I639 Cerebral infarction, unspecified: Secondary | ICD-10-CM

## 2020-03-26 LAB — GLUCOSE, CAPILLARY
Glucose-Capillary: 135 mg/dL — ABNORMAL HIGH (ref 70–99)
Glucose-Capillary: 163 mg/dL — ABNORMAL HIGH (ref 70–99)
Glucose-Capillary: 210 mg/dL — ABNORMAL HIGH (ref 70–99)
Glucose-Capillary: 156 mg/dL — ABNORMAL HIGH (ref 70–99)

## 2020-03-26 MED ORDER — LATANOPROST 0.005 % OP SOLN
1.0000 [drp] | Freq: Every day | OPHTHALMIC | Status: DC
  Administered 2020-03-26 – 2020-04-05 (×11): 1 [drp] via OPHTHALMIC
  Filled 2020-03-26: qty 2.5

## 2020-03-26 NOTE — Evaluation (Signed)
Occupational Therapy Assessment and Plan  Patient Details  Name: Martin Frazier. MRN: [id number] Date of Birth: [birth year]  OT Diagnosis: abnormal posture, hemiplegia affecting non-dominant side and muscle weakness (generalized) Rehab Potential: Rehab Potential (ACUTE ONLY): Excellent ELOS: 10-12 days   Today's Date: 03/26/2020 OT Individual Time: 3704-8889 OT Individual Time Calculation (min): 67 min     Problem List:  Patient Active Problem List   Diagnosis Date Noted  . AKI (acute kidney injury) ([place])   . Controlled type 2 diabetes mellitus with hyperglycemia, without long-term current use of insulin ([place])   . History of hypertension   . Right thalamic infarction ([place]) 03/25/2020  . Acute CVA (cerebrovascular accident) ([place])   . Benign prostatic hyperplasia   . Diabetes mellitus type 2 in nonobese ([facility])   . Acute ischemic stroke ([place]) 03/23/2020  . Acute ischemic VBA thalamic stroke, right ([place]) 03/22/2020  . Abdominal pain, epigastric 03/26/2019  . Abnormal CT of the abdomen 03/26/2019  . Spinal stenosis of lumbar region with neurogenic claudication 05/21/2018  . Unspecified constipation 07/22/2014  . Diabetes ([place]) 07/22/2014  . Essential hypertension, benign 07/22/2014  . GERD (gastroesophageal reflux disease) 07/22/2014    Past Medical History:  Past Medical History:  Diagnosis Date  . Collapsed lung    left  . Diabetes mellitus without complication ([place])    over 10 yrs  . GERD (gastroesophageal reflux disease)   . Hypertension   . Kidney stones    Past Surgical History:  Past Surgical History:  Procedure Laterality Date  . APPENDECTOMY    . BIOPSY  04/01/2019   Procedure: BIOPSY;  Surgeon: Rogene Houston, MD;  Location: [facility];  Service: Endoscopy;;  gastric  . CYSTOSCOPY W/ URETERAL STENT PLACEMENT Right 04/03/2019   Procedure: CYSTOSCOPY WITH RETROGRADE PYELOGRAM/URETERAL STENT PLACEMENT;  Surgeon: Franchot Gallo, MD;  Location: [facility];  Service:  Urology;  Laterality: Right;  . ESOPHAGOGASTRODUODENOSCOPY N/A 04/01/2019   Procedure: ESOPHAGOGASTRODUODENOSCOPY (EGD);  Surgeon: Rogene Houston, MD;  Location: [facility];  Service: Endoscopy;  Laterality: N/A;  10:55am  . EXTRACORPOREAL SHOCK WAVE LITHOTRIPSY Right 04/16/2019   Procedure: EXTRACORPOREAL SHOCK WAVE LITHOTRIPSY (ESWL);  Surgeon: Festus Aloe, MD;  Location: [facility];  Service: Urology;  Laterality: Right;  . HEMORRHOID SURGERY    . KIDNEY STONE SURGERY    . TONSILLECTOMY      Assessment & Plan Clinical Impression: Patient is a 86 y.o. year old male with recent admission to the hospital on 03/22/20 to [facility] with left hemiparesis and gait instability. Admission chemistries BUN 22, creatinine 1.49, urine drug screen negative, urinalysis negative, hemoglobin A1c 7.1.  CT/MRI showed acute right thalamic infarction. Patient did not receive TPA.  MRA of the head showed chronic microvascular ischemic changes with moderate to marked focal stenosis of the right paraclinoid ICA.  Echocardiogram with ejection fraction of 65% without embolus.  Carotid Dopplers atherosclerotic plaque at the carotid bulbs and proximal internal carotid arteries bilaterally.  Estimated degree of stenosis in the internal carotid arteries is less than 50% bilaterally.  Patient transferred to CIR on 03/25/2020 .    Patient currently requires mod with basic self-care skills secondary to muscle weakness, impaired timing and sequencing, unbalanced muscle activation and decreased coordination and decreased standing balance, hemiplegia and decreased balance strategies.  Prior to hospitalization, patient could complete ADLs with independent .  Patient will benefit from skilled intervention to decrease level of assist with basic self-care skills and increase independence  with basic self-care skills prior to discharge home with care partner.  Anticipate patient will require 24 hour supervision and follow up  home health.  OT - End of Session Activity Tolerance: Tolerates 30+ min activity with multiple rests Endurance Deficit: Yes OT Assessment Rehab Potential (ACUTE ONLY): Excellent OT Patient demonstrates impairments in the following area(s): Balance;Motor;Sensory OT Basic ADL's Functional Problem(s): Grooming;Bathing;Dressing;Toileting OT Transfers Functional Problem(s): Toilet;Tub/Shower OT Additional Impairment(s): Fuctional Use of Upper Extremity OT Plan OT Intensity: Minimum of 1-2 x/day, 45 to 90 minutes OT Frequency: 5 out of 7 days OT Duration/Estimated Length of Stay: 10-12 days OT Treatment/Interventions: Balance/vestibular training;Discharge planning;Self Care/advanced ADL retraining;Pain management;Therapeutic Activities;UE/LE Coordination activities;Functional mobility training;Patient/family education;Therapeutic Exercise;Community reintegration;DME/adaptive equipment instruction;Neuromuscular re-education;UE/LE Strength taining/ROM OT Self Feeding Anticipated Outcome(s): independent OT Basic Self-Care Anticipated Outcome(s): supervision OT Toileting Anticipated Outcome(s): supervision OT Bathroom Transfers Anticipated Outcome(s): supervision OT Recommendation Patient destination: Home Follow Up Recommendations: Home health OT;24 hour supervision/assistance Equipment Recommended: To be determined   Skilled Therapeutic Intervention Pt worked on shower and dressing during session.  Pt completed functional mobility from the EOB to the shower seat in the walk-in shower with mod assist and no device.  Once in the shower he needed use of the grab bar for support in sitting with LB bathing as well as with standing.  Min assist overall to complete task before transferring back out to the wheelchair with mod assist for dressing tasks.  He was able to complete all dressing with overall min assist sit to stand.  He demonstrated decreased ability to donn the button up shirt around his back  and also needed assist with fastening 50% of the buttons.  He was able to donn his pants with min assist and button and zip them in standing with overall min assist.  Finished session with pt in the wheelchair and family present.  Call button and phone in reach  Session 2: ([id number]) Pt in wheelchair to start session, agreeable to participation in OT session.   Began with further look at vision as well as BUE strength and coordination.  Pt with history of right rotator cuff involvement, so limited with shoulder flexion at this time.  He also demonstrates some rotator cuff weakness in the LUE as he could flex his shoulder up to 140 degrees on first attempt when asked by therapist, but could not reproduce this on 2nd and 3rd attempts.  Took pt down to the therapy gym where he transferred to the therapy mat with min assist using the RW for support.  He needed mod instructional cueing for hand placement on the wheelchair with sit to stand as well as for reaching back to the surface of the mat to sit.  Had him work on [employer] coordination task of picking up 1" round pegs with the LUE and placing them in the peg board.  Pt demonstrated some difficulty with task as well as increased fatigue in the arm, requiring rest breaks.  Provided pt with foam pieces to work on picking up and placing in a styrofoam cup in his room in order to continue working on coordination.  Finished session with transfer back to the room and transfer to the bed with overall min assist using the RW.  Call button and phone in reach with bed alarm in place.    OT Evaluation Precautions/Restrictions  Precautions Precautions: Fall;Other (comment) Precaution Comments: L hemiparesis/impaired coordination Restrictions Weight Bearing Restrictions: No  Vital Signs Therapy Vitals Temp: 97.8 F (36.6 C) Temp Source:  Oral Pulse Rate: 80 Resp: 18 BP: 125/60 Patient Position (if appropriate): Lying Oxygen Therapy SpO2: 98 % O2 Device: Room  Air Pain Pain Assessment Pain Scale: 0-10 Pain Score: 0-No pain Home Living/Prior Functioning Home Living Family/patient expects to be discharged to:: Private residence Living Arrangements: Spouse/significant other Available Help at Discharge: Family, Available 24 hours/day Type of Home: House Home Access: Stairs to enter Technical brewer of Steps: 2 Entrance Stairs-Rails: None Home Layout: One level Bathroom Shower/Tub: Multimedia programmer: Programmer, systems: Yes  Lives With: Spouse IADL History Current License: Yes Occupation: Retired Prior Function Level of Independence: Independent with homemaking with ambulation, Independent with gait, Independent with transfers  Able to Take Stairs?: Yes Driving: Yes Vocation: Retired Comments: Hydrographic surveyor, drives, saws wood and uses Herbalist, rides tractor, etc ADL ADL Eating: Supervision/safety Where Assessed-Eating: Bed level Grooming: Supervision/safety Where Assessed-Grooming: Wheelchair Upper Body Bathing: Supervision/safety Where Assessed-Upper Body Bathing: Shower Lower Body Bathing: Moderate assistance Where Assessed-Lower Body Bathing: Shower Upper Body Dressing: Minimal assistance Where Assessed-Upper Body Dressing: Wheelchair Lower Body Dressing: Moderate assistance Where Assessed-Lower Body Dressing: Wheelchair Toileting: Moderate assistance Where Assessed-Toileting: Glass blower/designer: Moderate assistance Toilet Transfer Method: Counselling psychologist: Geophysical data processor: Moderate assistance Social research officer, government Method: Heritage manager: Gaffer Baseline Vision/History: Wears glasses Wears Glasses: Reading only Patient Visual Report: No change from baseline Vision Assessment?: Yes Eye Alignment: Within Functional Limits Ocular Range of Motion: Within Functional  Limits Alignment/Gaze Preference: Within Defined Limits Tracking/Visual Pursuits: Other (comment)(Lost fixation at times on both the left and right but not specific to a certain area.) Saccades: Within functional limits Convergence: Within functional limits Visual Fields: No apparent deficits Perception  Perception: Within Functional Limits Praxis Praxis: Intact Cognition Overall Cognitive Status: Within Functional Limits for tasks assessed Arousal/Alertness: Awake/alert Orientation Level: Person;Situation;Place Person: Oriented Place: Oriented Situation: Oriented Year: 2021 Month: March Day of Week: Correct Memory: Appears intact Immediate Memory Recall: Sock;Blue;Bed Memory Recall Sock: Without Cue Memory Recall Blue: Without Cue Memory Recall Bed: Not able to recall Attention: Focused;Sustained Focused Attention: Appears intact Sustained Attention: Appears intact Awareness: Appears intact Problem Solving: Appears intact Safety/Judgment: Appears intact Sensation Sensation Light Touch: Impaired Detail Peripheral sensation comments: Pt able to detect light touch throughout the LUE but diminshed compared to the right. Light Touch Impaired Details: Impaired LUE Hot/Cold: Not tested Proprioception: Appears Intact Stereognosis: Not tested Coordination Gross Motor Movements are Fluid and Coordinated: No Fine Motor Movements are Fluid and Coordinated: No Coordination and Movement Description: Pt with slight ataxic movement in the LUE compared to the right.  Decreased FM control with assist needed for fastening buttons. Heel Shin Test: L LE ataxia Motor  Motor Motor: Hemiplegia;Ataxia Motor - Skilled Clinical Observations: mild left hemiparesis Mobility  Bed Mobility Bed Mobility: Supine to Sit Supine to Sit: Contact Guard/Touching assist Transfers Sit to Stand: Minimal Assistance - Patient > 75% Stand to Sit: Minimal Assistance - Patient > 75%  Trunk/Postural  Assessment  Cervical Assessment Cervical Assessment: Exceptions to WFL(cervical protraction and slight flexion at rest) Thoracic Assessment Thoracic Assessment: Exceptions to WFL(thoracic rounding) Lumbar Assessment Lumbar Assessment: Exceptions to WFL(posterior pelvic tilt)  Balance Balance Balance Assessed: Yes Standardized Balance Assessment Standardized Balance Assessment: Berg Balance Test Berg Balance Test Sit to Stand: Needs minimal aid to stand or to stabilize Standing Unsupported: Able to stand 30 seconds unsupported Sitting with Back Unsupported but Feet Supported on Floor or Stool: Able  to sit 2 minutes under supervision Stand to Sit: Controls descent by using hands Transfers: Needs one person to assist Standing Unsupported with Eyes Closed: Needs help to keep from falling Standing Ubsupported with Feet Together: Needs help to attain position and unable to hold for 15 seconds From Standing, Reach Forward with Outstretched Arm: Loses balance while trying/requires external support From Standing Position, Pick up Object from Floor: Unable to try/needs assist to keep balance From Standing Position, Turn to Look Behind Over each Shoulder: Needs assist to keep from losing balance and falling Turn 360 Degrees: Needs assistance while turning Standing Unsupported, Alternately Place Feet on Step/Stool: Needs assistance to keep from falling or unable to try Standing Unsupported, One Foot in Front: Loses balance while stepping or standing Standing on One Leg: Unable to try or needs assist to prevent fall Total Score: 10 Static Sitting Balance Static Sitting - Balance Support: Feet supported Static Sitting - Level of Assistance: 5: Stand by assistance Dynamic Sitting Balance Dynamic Sitting - Balance Support: During functional activity Dynamic Sitting - Level of Assistance: 4: Min assist Static Standing Balance Static Standing - Balance Support: During functional activity Static  Standing - Level of Assistance: 3: Mod assist Dynamic Standing Balance Dynamic Standing - Balance Support: During functional activity Dynamic Standing - Level of Assistance: 3: Mod assist Extremity/Trunk Assessment RUE Assessment RUE Assessment: Exceptions to [facility] Passive Range of Motion (PROM) Comments: PROM WFLs for all joints Active Range of Motion (AROM) Comments: shoulder flexion 0-50 degrees secondary to history of rotator cuff involvement General Strength Comments: Pt with overall strength 5/5 at the elbow flexors and extensors as well as grip.  Right shoulder flexion 2-/5 LUE Assessment LUE Assessment: Exceptions to [facility] Passive Range of Motion (PROM) Comments: PROM WFLs for all joints Active Range of Motion (AROM) Comments: shoulder flexion 0-70 degrees with elbow extended, he was able to achieve greater AROM with elbow flexion up to 140 degrees, but unable to repeat consistently secondary to rotator cuff weakness General Strength Comments: Pt with elbow flexion/extension at 4/5, grip strength 4/5 as well.  Decreased FM coordination noted with fastening buttons.     Refer to Care Plan for Long Term Goals  Recommendations for other services: None    Discharge Criteria: Patient will be discharged from OT if patient refuses treatment 3 consecutive times without medical reason, if treatment goals not met, if there is a change in medical status, if patient makes no progress towards goals or if patient is discharged from hospital.  The above assessment, treatment plan, treatment alternatives and goals were discussed and mutually agreed upon: by patient  Emillia Weatherly OTR/L 03/26/2020, 4:39 PM

## 2020-03-26 NOTE — Progress Notes (Signed)
[facility] PHYSICAL MEDICINE & REHABILITATION PROGRESS NOTE  Subjective/Complaints: Patient seen sitting up in bed this morning.  He states he slept well overnight.  He states he is ready to begin therapies today.  He states he needs to have a bowel movement.  Later, wife requests resuming patient's home eyedrops.  ROS: Denies CP, shortness of breath, nausea, vomiting, diarrhea.  Objective: Vital Signs: Blood pressure (!) 149/85, pulse 91, temperature 97.9 F (36.6 C), resp. rate 18, height 5\' 8"  (1.727 m), weight 60.2 kg, SpO2 97 %. No results found. Recent Labs    03/25/20 1432  WBC 7.3  HGB 13.8  HCT 41.0  PLT 185   Recent Labs    03/25/20 1432  CREATININE 1.82*    Physical Exam: BP (!) 149/85 (BP Location: Right Arm)   Pulse 91   Temp 97.9 F (36.6 C)   Resp 18   Ht 5\' 8"  (1.727 m)   Wt 60.2 kg   SpO2 97%   BMI 20.18 kg/m  Constitutional: No distress . Vital signs reviewed. HENT: Normocephalic.  Atraumatic. Eyes: EOMI. No discharge. Cardiovascular: No JVD. Respiratory: Normal effort.  No stridor. GI: Non-distended. Skin: Warm and dry.  Intact. Psych: Normal mood.  Normal behavior. Musc: No edema in extremities.  No tenderness in extremities. Neurological: Alert [facility] Makes Eye contact with examiner and follows commands.   Motor: 4+-5/5 throughout, except for right shoulder, limited due to rotator cuff injury, unchanged  Assessment/Plan: 1. Functional deficits secondary to right thalamic infarct which require 3+ hours per day of interdisciplinary therapy in a comprehensive inpatient rehab setting.  Physiatrist is providing close team supervision and 24 hour management of active medical problems listed below.  Physiatrist and rehab team continue to assess barriers to discharge/monitor patient progress toward functional and medical goals  Care Tool:  Bathing              Bathing assist       Upper Body Dressing/Undressing Upper body dressing    What is the patient wearing?: Pull over shirt    Upper body assist Assist Level: Minimal Assistance - Patient > 75%    Lower Body Dressing/Undressing Lower body dressing      What is the patient wearing?: Pants     Lower body assist Assist for lower body dressing: Moderate Assistance - Patient 50 - 74%     Toileting Toileting    Toileting assist Assist for toileting: Set up assist Assistive Device Comment: (urinal)   Transfers Chair/bed transfer  Transfers assist     Chair/bed transfer assist level: Minimal Assistance - Patient > 75%     Locomotion Ambulation   Ambulation assist              Walk 10 feet activity   Assist           Walk 50 feet activity   Assist           Walk 150 feet activity   Assist           Walk 10 feet on uneven surface  activity   Assist           Wheelchair     Assist               Wheelchair 50 feet with 2 turns activity    Assist            Wheelchair 150 feet activity     Assist  Medical Problem List and Plan: 1.  Left side weakness secondary to right thalamic infarction on 03/22/2020  Begin CIR evaluations 2.  Antithrombotics: -DVT/anticoagulation: Subcutaneous heparin             -antiplatelet therapy: Aspirin 325 mg daily 3. Pain Management: Tylenol as needed 4. Mood: Provide emotional support             -antipsychotic agents: N/A 5. Neuropsych: This patient is capable of making decisions on his own behalf. 6. Skin/Wound Care: Routine skin checks 7. Fluids/Electrolytes/Nutrition: Routine in and outs.   8.  Hypertension.  Patient on Altace 2.5 mg daily prior to admission.  Resume as needed  Monitor with increased mobility 9.  Diabetes mellitus with hyperglycemia.  Hemoglobin A1c 7.1.  Currently with SSI.  Patient on Glucophage 500 mg daily prior to admission.  Resume as needed             Monitor with increased mobility 10.  Hyperlipidemia.   Continue Lipitor 11.  BPH.  Flomax 0.4 mg daily.    PVRs ordered. 12. Recent history of tobacco abuse.  Counseling 13.  AKI on?  CKD  Creatinine 1.82 on 3/26, labs ordered for Monday  Encourage fluids  LOS: 1 days A FACE TO FACE EVALUATION WAS PERFORMED  Emili Mcloughlin Lorie Phenix 03/26/2020, 3:22 PM

## 2020-03-26 NOTE — Plan of Care (Signed)
  Problem: Consults Goal: RH STROKE PATIENT EDUCATION Description: See Patient Education module for education specifics  Outcome: Progressing Goal: Diabetes Guidelines if Diabetic/Glucose > 140 Description: If diabetic or lab glucose is > 140 mg/dl - Initiate Diabetes/Hyperglycemia Guidelines & Document Interventions  Outcome: Progressing   Problem: RH BOWEL ELIMINATION Goal: RH STG MANAGE BOWEL WITH ASSISTANCE Description: STG Manage Bowel with min Assistance. Outcome: Progressing Goal: RH STG MANAGE BOWEL W/MEDICATION W/ASSISTANCE Description: STG Manage Bowel with Medication with mod I Assistance. Outcome: Progressing   Problem: RH SAFETY Goal: RH STG ADHERE TO SAFETY PRECAUTIONS W/ASSISTANCE/DEVICE Description: STG Adhere to Safety Precautions With cues/reminders Assistance/Device. Outcome: Progressing Goal: RH STG DECREASED RISK OF FALL WITH ASSISTANCE Description: STG Decreased Risk of Fall With supervision Assistance. Outcome: Progressing   Problem: RH KNOWLEDGE DEFICIT Goal: RH STG INCREASE KNOWLEDGE OF DIABETES Description: Pt will be able to manage DM with medication compliance and diet regimen and also knowing signs of hypo/hyperglycemia using handouts/booklets with mod I assist Outcome: Progressing Goal: RH STG INCREASE KNOWLEGDE OF HYPERLIPIDEMIA Description: Pt will be able to manage HLD with medication compliance and diet regimen using handouts/booklets with mod I assist Outcome: Progressing Goal: RH STG INCREASE KNOWLEDGE OF STROKE PROPHYLAXIS Description: Pt will be able to manage stroke prevention with medication compliance and diet regimen using handouts/booklets with mod I assist Outcome: Progressing   

## 2020-03-26 NOTE — Evaluation (Signed)
Physical Therapy Assessment and Plan  Patient Details  Name: Martin Frazier. MRN: [id number] Date of Birth: [birth year]  PT Diagnosis: Abnormal posture, Abnormality of gait, Ataxia, Difficulty walking, Hemiparesis non-dominant and Impaired sensation Rehab Potential: Excellent ELOS: ~1.5-2 weeks   Today's Date: 03/26/2020 PT Individual Time: 3662-9476 PT Individual Time Calculation (min): 60 min    Problem List:  Patient Active Problem List   Diagnosis Date Noted  . Right thalamic infarction ([place]) 03/25/2020  . Acute CVA (cerebrovascular accident) ([place])   . Benign prostatic hyperplasia   . Diabetes mellitus type 2 in nonobese (HCC)   . Acute ischemic stroke ([place]) 03/23/2020  . Acute ischemic VBA thalamic stroke, right ([place]) 03/22/2020  . Abdominal pain, epigastric 03/26/2019  . Abnormal CT of the abdomen 03/26/2019  . Spinal stenosis of lumbar region with neurogenic claudication 05/21/2018  . Unspecified constipation 07/22/2014  . Diabetes ([place]) 07/22/2014  . Essential hypertension, benign 07/22/2014  . GERD (gastroesophageal reflux disease) 07/22/2014    Past Medical History:  Past Medical History:  Diagnosis Date  . Collapsed lung    left  . Diabetes mellitus without complication ([place])    over 10 yrs  . GERD (gastroesophageal reflux disease)   . Hypertension   . Kidney stones    Past Surgical History:  Past Surgical History:  Procedure Laterality Date  . APPENDECTOMY    . BIOPSY  04/01/2019   Procedure: BIOPSY;  Surgeon: Rogene Houston, MD;  Location: [facility];  Service: Endoscopy;;  gastric  . CYSTOSCOPY W/ URETERAL STENT PLACEMENT Right 04/03/2019   Procedure: CYSTOSCOPY WITH RETROGRADE PYELOGRAM/URETERAL STENT PLACEMENT;  Surgeon: Franchot Gallo, MD;  Location: [facility];  Service: Urology;  Laterality: Right;  . ESOPHAGOGASTRODUODENOSCOPY N/A 04/01/2019   Procedure: ESOPHAGOGASTRODUODENOSCOPY (EGD);  Surgeon: Rogene Houston, MD;  Location: [facility];   Service: Endoscopy;  Laterality: N/A;  10:55am  . EXTRACORPOREAL SHOCK WAVE LITHOTRIPSY Right 04/16/2019   Procedure: EXTRACORPOREAL SHOCK WAVE LITHOTRIPSY (ESWL);  Surgeon: Festus Aloe, MD;  Location: [facility];  Service: Urology;  Laterality: Right;  . HEMORRHOID SURGERY    . KIDNEY STONE SURGERY    . TONSILLECTOMY      Assessment & Plan Clinical Impression: Patient is a 86 y.o. year old right-handed male with history of diabetes mellitus, hypertension and recently stopped smoking, CKD stage III as well as BPH.  History taken from chart review and patient due to [facility].  Patient lives with spouse.  Independent prior to admission and active.  1 level home 2 steps to entry.  He presented on 03/22/20 to [facility] with left hemiparesis and gait instability. Admission chemistries BUN 22, creatinine 1.49, urine drug screen negative, urinalysis negative, hemoglobin A1c 7.1.  CT/MRI showed acute right thalamic infarction. Patient did not receive TPA.  MRA of the head showed chronic microvascular ischemic changes with moderate to marked focal stenosis of the right paraclinoid ICA.  Echocardiogram with ejection fraction of 65% without embolus.  Carotid Dopplers atherosclerotic plaque at the carotid bulbs and proximal internal carotid arteries bilaterally.  Estimated degree of stenosis in the internal carotid arteries is less than 50% bilaterally.  Follow-up neurology services Dr.[place] maintain on aspirin 325 mg daily.  Subcutaneous heparin for DVT prophylaxis.  Tolerating a regular consistency diet.  Therapy evaluations completed and patient was admitted for a comprehensive rehab program. Patient transferred to CIR on 03/25/2020 .   Patient currently requires mod with mobility secondary to muscle weakness, decreased cardiorespiratoy endurance, impaired  timing and sequencing, unbalanced muscle activation, ataxia and decreased coordination and decreased sitting balance, decreased standing balance,  decreased postural control and decreased balance strategies.  Prior to hospitalization, patient was independent  with mobility and lived with Spouse in a House home.  Home access is 2Stairs to enter.  Patient will benefit from skilled PT intervention to maximize safe functional mobility, minimize fall risk and decrease caregiver burden for planned discharge home with 24 hour supervision.  Anticipate patient will benefit from follow up [place] at discharge.  PT - End of Session Activity Tolerance: Tolerates 30+ min activity with multiple rests Endurance Deficit: Yes Endurance Deficit Description: requires seated rest breaks PT Assessment Rehab Potential (ACUTE/IP ONLY): Excellent PT Barriers to Discharge: Decreased caregiver support;Home environment access/layout PT Patient demonstrates impairments in the following area(s): Balance;Perception;Behavior;Safety;Sensory;Endurance;Skin Integrity;Edema;Motor;Nutrition;Pain PT Transfers Functional Problem(s): Bed Mobility;Bed to Chair;Car;Furniture PT Locomotion Functional Problem(s): Ambulation;Stairs PT Plan PT Intensity: Minimum of 1-2 x/day ,45 to 90 minutes PT Frequency: 5 out of 7 days PT Duration Estimated Length of Stay: ~1.5-2 weeks PT Treatment/Interventions: Ambulation/gait training;Community reintegration;DME/adaptive equipment instruction;Neuromuscular re-education;Psychosocial support;Stair training;UE/LE Strength taining/ROM;Balance/vestibular training;Discharge planning;Functional electrical stimulation;Pain management;Skin care/wound management;Therapeutic Activities;UE/LE Coordination activities;Cognitive remediation/compensation;Disease management/prevention;Functional mobility training;Patient/family education;Therapeutic Exercise;Visual/perceptual remediation/compensation;Splinting/orthotics PT Transfers Anticipated Outcome(s): supervision using LRAD PT Locomotion Anticipated Outcome(s): supervision using LRAD PT  Recommendation Recommendations for Other Services: Therapeutic Recreation consult Therapeutic Recreation Interventions: Outing/community reintergration Follow Up Recommendations: Home health PT;24 hour supervision/assistance;Outpatient PT(TBD pending pt progress) Patient destination: Home Equipment Recommended: To be determined Equipment Details: has RW  Skilled Therapeutic Intervention Evaluation completed (see details above and below) with education on PT POC and goals and individual treatment initiated with focus on activity tolerance, bed mobility, transfers, gait training, stair navigation, standing balance, and pt education regarding daily therapy schedule, weekly team meetings, purpose of PT evaluation, and other CIR information. Pt received standing in bathroom with RN assisting pt to complete continent void of bladder from standing position. Therapist assumed care of patient. Ambulated ~19f to sink using RW with min assist for balance and cuing for safe AD management at counter. Standing hand hygiene at sink with CGA/min assist for steadying. Stand pivot to w/c using RW with min assist for balance. Stand pivot w/c<>EOB no AD with min assist for balance. Sit<>supine in bed without using bed features with close supervision. Transported to/from gym in w/c for time management and energy conservation. Gait training 1370fusing L HHA with mod assist for balance - demonstrates decreased gait speed, decreased step lengths, and increased postural sway. Ascended/descneded 12 steps using B HRs with min assist for balance and pt initially self-selecting step-to pattern and progressively transitioning to reciprocal pattern without cuing and no more unsteadiness noted. Ambulatory simulated car transfer, no AD, with min assist for balance. Ambulated ~1568fp/down ramp using UE support on handrail with min assist for balance. [facility] increased fall risk as noted  by score of  10/56.  (<36= high risk for falls, close to 100%; 37-45 significant >80%; 46-51 moderate >50%; 52-55 lower >25%). During test pt self-selects very wide BOS to improve static standing balance with repeated posterior LOBs requiring mod/max assist to recover due to impaired, delayed, and inadequate balance recovery strategies. Therapist educated pt on results of test and significant increased fall risk. Pt demonstrating increasing fatigue. Transported back to room in w/c and agreeable to remain sitting up as next OT session is in less than 1 hour. Left with needs in reach and seat  belt alarm on.   PT Evaluation Precautions/Restrictions Precautions Precautions: Fall;Other (comment) Precaution Comments: L hemiparesis/impaired coordination Restrictions Weight Bearing Restrictions: No Pain Pain Assessment Pain Scale: 0-10 Pain Score: 0-No pain Home Living/Prior Functioning Home Living Available Help at Discharge: Family;Available 24 hours/day Type of Home: House Home Access: Stairs to enter [employer] of Steps: 2 Entrance Stairs-Rails: None Home Layout: One level  Lives With: Spouse Prior Function Level of Independence: Independent with homemaking with ambulation;Independent with gait;Independent with transfers  Able to Take Stairs?: Yes Driving: Yes Vocation: Retired Comments: Hydrographic surveyor, drives, saws wood and uses Herbalist, rides tractor, Patent attorney: Within Financial controller Praxis: Intact  Cognition Overall Cognitive Status: Within Functional Limits for tasks assessed Arousal/Alertness: Awake/alert Orientation Level: Oriented X4 Attention: Focused;Sustained Focused Attention: Appears intact Sustained Attention: Appears intact Safety/Judgment: Appears intact Sensation Sensation Light Touch: Impaired Detail Peripheral sensation comments: reports numbness in L UE and impaired L LE sensation  compared to R LE - decreased light touch sensation in B LEs Light Touch Impaired Details: Impaired LLE;Impaired LUE Hot/Cold: Not tested Proprioception: Impaired by gross assessment Stereognosis: Not tested Coordination Gross Motor Movements are Fluid and Coordinated: No Coordination and Movement Description: gross motor movements impaired due to impaired coordination and impaired balance Heel Shin Test: L LE ataxia Motor  Motor Motor: Hemiplegia;Ataxia Motor - Skilled Clinical Observations: mild left hemiparesis (UE>LE)  Mobility Bed Mobility Bed Mobility: Supine to Sit;Sit to Supine Supine to Sit: Supervision/Verbal cueing Sit to Supine: Supervision/Verbal cueing Transfers Transfers: Sit to Stand;Stand to Sit;Stand Pivot Transfers Sit to Stand: Minimal Assistance - Patient > 75% Stand to Sit: Minimal Assistance - Patient > 75% Stand Pivot Transfers: Minimal Assistance - Patient > 75%;Moderate Assistance - Patient 50 - 74% Transfer (Assistive device): None Locomotion  Gait Ambulation: Yes Gait Assistance: Moderate Assistance - Patient 50-74% Gait Distance (Feet): 132 Feet Assistive device: 1 person hand held assist Gait Assistance Details: Tactile cues for sequencing;Tactile cues for weight shifting;Verbal cues for technique;Verbal cues for sequencing;Verbal cues for gait pattern;Verbal cues for precautions/safety Gait Gait: Yes Gait Pattern: Impaired Gait Pattern: Decreased step length - left;Decreased step length - right;Lateral trunk lean to right;Decreased weight shift to left Gait velocity: decreased Stairs / Additional Locomotion Stairs: Yes Stairs Assistance: Minimal Assistance - Patient > 75% Stair Management Technique: Two rails Number of Stairs: 12 Height of Stairs: 6 Ramp: Minimal Assistance - Patient >75%(using railing) Wheelchair Mobility Wheelchair Mobility: No  Trunk/Postural Assessment  Cervical Assessment Cervical Assessment: Exceptions to WFL(cervical  protraction) Thoracic Assessment Thoracic Assessment: Exceptions to WFL(thoracic rounding) Lumbar Assessment Lumbar Assessment: Exceptions to WFL(posterior pelvic tilt in sitting) Postural Control Postural Control: Deficits on evaluation Righting Reactions: during Berg Balance Test has posterior LOB with impaired, delayed, and inadequate balance recovery strategies Postural Limitations: decreased with pt self-selecting wide BOS for static stance to increase stability  Balance Balance Balance Assessed: Yes Standardized Balance Assessment Standardized Balance Assessment: Berg Balance Test Berg Balance Test Sit to Stand: Needs minimal aid to stand or to stabilize Standing Unsupported: Able to stand 30 seconds unsupported Sitting with Back Unsupported but Feet Supported on Floor or Stool: Able to sit 2 minutes under supervision Stand to Sit: Controls descent by using hands Transfers: Needs one person to assist Standing Unsupported with Eyes Closed: Needs help to keep from falling Standing Ubsupported with Feet Together: Needs help to attain position and unable to hold for 15 seconds From Standing, Reach Forward with Outstretched Arm: Loses  balance while trying/requires external support From Standing Position, Pick up Object from Floor: Unable to try/needs assist to keep balance From Standing Position, Turn to Look Behind Over each Shoulder: Needs assist to keep from losing balance and falling Turn 360 Degrees: Needs assistance while turning Standing Unsupported, Alternately Place Feet on Step/Stool: Needs assistance to keep from falling or unable to try Standing Unsupported, One Foot in Front: Loses balance while stepping or standing Standing on One Leg: Unable to try or needs assist to prevent fall Total Score: 10 Static Sitting Balance Static Sitting - Balance Support: Feet supported;Bilateral upper extremity supported Static Sitting - Level of Assistance: 5: Stand by  assistance Dynamic Sitting Balance Dynamic Sitting - Balance Support: During functional activity Dynamic Sitting - Level of Assistance: 4: Min assist Static Standing Balance Static Standing - Balance Support: During functional activity;No upper extremity supported Static Standing - Level of Assistance: 3: Mod assist Dynamic Standing Balance Dynamic Standing - Balance Support: During functional activity;No upper extremity supported Dynamic Standing - Level of Assistance: 3: Mod assist;2: Max assist Extremity Assessment      RLE Assessment RLE Assessment: Within Functional Limits Active Range of Motion (AROM) Comments: WFL General Strength Comments: Grossly 5/5 assessed in sitting LLE Assessment LLE Assessment: Within Functional Limits Active Range of Motion (AROM) Comments: WFL General Strength Comments: Grossly 5/5 assessed in sitting with ankle DF 4+/5    Refer to Care Plan for Long Term Goals  Recommendations for other services: Therapeutic Recreation  Outing/community reintegration  Discharge Criteria: Patient will be discharged from PT if patient refuses treatment 3 consecutive times without medical reason, if treatment goals not met, if there is a change in medical status, if patient makes no progress towards goals or if patient is discharged from hospital.  The above assessment, treatment plan, treatment alternatives and goals were discussed and mutually agreed upon: by patient  Tawana Scale, PT, DPT 03/26/2020, 12:22 PM

## 2020-03-26 NOTE — Progress Notes (Signed)
Initial Nutrition Assessment  DOCUMENTATION CODES:   Not applicable  INTERVENTION:  CIB po daily on breakfast tray, each supplement with 237 ml low fat milk provides 220 kcal and 13 grams of protein  Will continue Ensure BID and provide alternate ONS per pt preferences   NUTRITION DIAGNOSIS:   Increased nutrient needs related to acute illness(CIR admit s/p left sided weakness due to right thalamic infarction) as evidenced by estimated needs.  GOAL:   Patient will meet greater than or equal to 90% of their needs   MONITOR:   Labs, I & O's, Supplement acceptance, PO intake, Weight trends  REASON FOR ASSESSMENT:   Malnutrition Screening Tool    ASSESSMENT:   RD working remotely.  86 year old male with history of DM2, HTN, CKD stage III, BPH, and HOH presented to [facility] on 3/23 with left hemiparesis and gait instability. 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  Unable to obtain nutrition history at this time, per MD note, patient is [facility] and admitting history was obtained via chart. Patient is on a HH/CM diet, per flowsheets he ate 100% of breakfast this morning, 50% of lunch and 100% of dinner last night. He is receiving Ensure 2x/day, noted patient refusal of supplements today per medication review. Attempted to speak with RN via phone to assess if patient did not like supplement, RN was unable to take RD call at this time. Will reattempt to reach RN this afternoon as time allows. Will order CIB on breakfast tray and provide alternate supplement pending pt preferences.   Current wt 132.44 lbs No recent weight history for review, per chart pt UBW 138-145 from 01/30/19 - 04/03/19 Medications reviewed and include: SSI Labs: CBGs 163, 135,159,121 x 24 hrs   NUTRITION  - FOCUSED PHYSICAL EXAM: Unable to complete at this time, RD working remotely.  Diet Order:   Diet Order            Diet heart healthy/carb modified Room service appropriate? Yes; Fluid consistency: Thin  Diet effective now              EDUCATION NEEDS:   Not appropriate for education at this time  Skin:  Skin Assessment: Reviewed RN Assessment  Last BM:  3/25  Height:   Ht Readings from Last 1 Encounters:  03/25/20 5\' 8"  (1.727 m)    Weight:   Wt Readings from Last 1 Encounters:  03/25/20 60.2 kg    Ideal Body Weight:  70 kg  BMI:  Body mass index is 20.18 kg/m.  Estimated Nutritional Needs:   Kcal:  1700-1900  Protein:  85-95  Fluid:  >/= 1.5 L/day   03/27/20, RD, LDN Clinical Nutrition After Hours/Weekend Pager # in Amion

## 2020-03-27 LAB — GLUCOSE, CAPILLARY
Glucose-Capillary: 157 mg/dL — ABNORMAL HIGH (ref 70–99)
Glucose-Capillary: 159 mg/dL — ABNORMAL HIGH (ref 70–99)
Glucose-Capillary: 153 mg/dL — ABNORMAL HIGH (ref 70–99)
Glucose-Capillary: 180 mg/dL — ABNORMAL HIGH (ref 70–99)

## 2020-03-27 NOTE — Progress Notes (Addendum)
Martin Frazier PHYSICAL MEDICINE & REHABILITATION PROGRESS NOTE  Subjective/Complaints: Patient seen standing up with rolling walker this AM.  Good standing balance noted.  He states he slept well overnight.  He states he had a good first day of therapies yesterday.  ROS: Denies CP, shortness of breath, nausea, vomiting, diarrhea.  Objective: Vital Signs: Blood pressure 114/64, pulse 83, temperature 98.1 F (36.7 C), resp. rate 18, height 5\' 8"  (1.727 m), weight 60.2 kg, SpO2 97 %. No results found. Recent Labs    03/25/20 1432  WBC 7.3  HGB 13.8  HCT 41.0  PLT 185   Recent Labs    03/25/20 1432  CREATININE 1.82*    Physical Exam: BP 114/64 (BP Location: Left Arm)   Pulse 83   Temp 98.1 F (36.7 C)   Resp 18   Ht 5\' 8"  (1.727 m)   Wt 60.2 kg   SpO2 97%   BMI 20.18 kg/m   Constitutional: No distress . Vital signs reviewed. HENT: Normocephalic.  Atraumatic. Eyes: EOMI. No discharge. Cardiovascular: No JVD. Respiratory: Normal effort.  No stridor. GI: Non-distended. Skin: Warm and dry.  Intact. Psych: Normal mood.  Normal behavior. Musc: No edema in extremities.  No tenderness in extremities. Neurological: Alert [facility] Makes Eye contact with examiner and follows commands.   Motor: 4+-5/5 throughout, except for right shoulder, limited due to rotator cuff injury, stable  Assessment/Plan: 1. Functional deficits secondary to right thalamic infarct which require 3+ hours per day of interdisciplinary therapy in a comprehensive inpatient rehab setting.  Physiatrist is providing close team supervision and 24 hour management of active medical problems listed below.  Physiatrist and rehab team continue to assess barriers to discharge/monitor patient progress toward functional and medical goals  Care Tool:  Bathing    Body parts bathed by patient: Buttocks, Front perineal area         Bathing assist Assist Level: Supervision/Verbal cueing     Upper Body  Dressing/Undressing Upper body dressing   What is the patient wearing?: Button up shirt    Upper body assist Assist Level: Minimal Assistance - Patient > 75%    Lower Body Dressing/Undressing Lower body dressing      What is the patient wearing?: Pants     Lower body assist Assist for lower body dressing: Minimal Assistance - Patient > 75%     Toileting Toileting    Toileting assist Assist for toileting: Contact Guard/Touching assist Assistive Device Comment: urinal   Transfers Chair/bed transfer  Transfers assist     Chair/bed transfer assist level: Supervision/Verbal cueing     Locomotion Ambulation   Ambulation assist      Assist level: Moderate Assistance - Patient 50 - 74% Assistive device: No Device Max distance: 149ft   Walk 10 feet activity   Assist     Assist level: Moderate Assistance - Patient - 50 - 74% Assistive device: No Device   Walk 50 feet activity   Assist    Assist level: Moderate Assistance - Patient - 50 - 74% Assistive device: No Device    Walk 150 feet activity   Assist Walk 150 feet activity did not occur: Safety/medical concerns         Walk 10 feet on uneven surface  activity   Assist     Assist level: Minimal Assistance - Patient > 75% Assistive device: Other (comment), Hand held assist(HHA and handrail)   Wheelchair     Assist Will patient use wheelchair at discharge?: No(anticipate  will be ambulatory)             Wheelchair 50 feet with 2 turns activity    Assist            Wheelchair 150 feet activity     Assist            Medical Problem List and Plan: 1.  Left side weakness secondary to right thalamic infarction on 03/22/2020  Continue CIR 2.  Antithrombotics: -DVT/anticoagulation: Subcutaneous heparin             -antiplatelet therapy: Aspirin 325 mg daily 3. Pain Management: Tylenol as needed 4. Mood: Provide emotional support             -antipsychotic  agents: N/A 5. Neuropsych: This patient is capable of making decisions on his own behalf. 6. Skin/Wound Care: Routine skin checks 7. Fluids/Electrolytes/Nutrition: Routine in and outs.   8.  Hypertension.  Patient on Altace 2.5 mg daily prior to admission.  Resume as needed  Controlled on 3/28  Monitor with increased mobility 9.  Diabetes mellitus with hyperglycemia.  Hemoglobin A1c 7.1.  Currently with SSI.  Patient on Glucophage 500 mg daily prior to admission.    Continue SSI, resume Metformin once kidney function improves.             Monitor with increased mobility 10.  Hyperlipidemia.  Continue Lipitor 11.  BPH.  Flomax 0.4 mg daily.    PVRs low. 12. Recent history of tobacco abuse.  Counseling 13.  AKI on?  CKD  Creatinine 1.82 on 3/26, labs ordered for tomorow  Encourage fluids  LOS: 2 days A FACE TO FACE EVALUATION WAS PERFORMED  Ayushi Pla Lorie Phenix 03/27/2020, 7:32 PM

## 2020-03-27 NOTE — Plan of Care (Signed)
  Problem: Consults Goal: RH STROKE PATIENT EDUCATION Description: See Patient Education module for education specifics  Outcome: Progressing Goal: Diabetes Guidelines if Diabetic/Glucose > 140 Description: If diabetic or lab glucose is > 140 mg/dl - Initiate Diabetes/Hyperglycemia Guidelines & Document Interventions  Outcome: Progressing   Problem: RH BOWEL ELIMINATION Goal: RH STG MANAGE BOWEL WITH ASSISTANCE Description: STG Manage Bowel with min Assistance. Outcome: Progressing Goal: RH STG MANAGE BOWEL W/MEDICATION W/ASSISTANCE Description: STG Manage Bowel with Medication with mod I Assistance. Outcome: Progressing   Problem: RH SAFETY Goal: RH STG ADHERE TO SAFETY PRECAUTIONS W/ASSISTANCE/DEVICE Description: STG Adhere to Safety Precautions With cues/reminders Assistance/Device. Outcome: Progressing Goal: RH STG DECREASED RISK OF FALL WITH ASSISTANCE Description: STG Decreased Risk of Fall With supervision Assistance. Outcome: Progressing   Problem: RH KNOWLEDGE DEFICIT Goal: RH STG INCREASE KNOWLEDGE OF DIABETES Description: Pt will be able to manage DM with medication compliance and diet regimen and also knowing signs of hypo/hyperglycemia using handouts/booklets with mod I assist Outcome: Progressing Goal: RH STG INCREASE KNOWLEGDE OF HYPERLIPIDEMIA Description: Pt will be able to manage HLD with medication compliance and diet regimen using handouts/booklets with mod I assist Outcome: Progressing Goal: RH STG INCREASE KNOWLEDGE OF STROKE PROPHYLAXIS Description: Pt will be able to manage stroke prevention with medication compliance and diet regimen using handouts/booklets with mod I assist Outcome: Progressing   

## 2020-03-28 ENCOUNTER — Inpatient Hospital Stay (HOSPITAL_COMMUNITY): Payer: Medicare Other | Admitting: Occupational Therapy

## 2020-03-28 ENCOUNTER — Inpatient Hospital Stay (HOSPITAL_COMMUNITY): Payer: Medicare Other | Admitting: Physical Therapy

## 2020-03-28 LAB — CBC WITH DIFFERENTIAL/PLATELET
Abs Immature Granulocytes: 0.04 10*3/uL (ref 0.00–0.07)
Basophils Absolute: 0 10*3/uL (ref 0.0–0.1)
Basophils Relative: 1 %
Eosinophils Absolute: 0.2 10*3/uL (ref 0.0–0.5)
Eosinophils Relative: 2 %
HCT: 40 % (ref 39.0–52.0)
Hemoglobin: 13.5 g/dL (ref 13.0–17.0)
Immature Granulocytes: 1 %
Lymphocytes Relative: 26 %
Lymphs Abs: 1.9 10*3/uL (ref 0.7–4.0)
MCH: 32.4 pg (ref 26.0–34.0)
MCHC: 33.8 g/dL (ref 30.0–36.0)
MCV: 95.9 fL (ref 80.0–100.0)
Monocytes Absolute: 0.8 10*3/uL (ref 0.1–1.0)
Monocytes Relative: 11 %
Neutro Abs: 4.3 10*3/uL (ref 1.7–7.7)
Neutrophils Relative %: 59 %
Platelets: 198 10*3/uL (ref 150–400)
RBC: 4.17 MIL/uL — ABNORMAL LOW (ref 4.22–5.81)
RDW: 12.4 % (ref 11.5–15.5)
WBC: 7.3 10*3/uL (ref 4.0–10.5)
nRBC: 0 % (ref 0.0–0.2)

## 2020-03-28 LAB — COMPREHENSIVE METABOLIC PANEL
ALT: 21 U/L (ref 0–44)
AST: 26 U/L (ref 15–41)
Albumin: 3.6 g/dL (ref 3.5–5.0)
Alkaline Phosphatase: 129 U/L — ABNORMAL HIGH (ref 38–126)
Anion gap: 11 (ref 5–15)
BUN: 42 mg/dL — ABNORMAL HIGH (ref 8–23)
CO2: 28 mmol/L (ref 22–32)
Calcium: 9.3 mg/dL (ref 8.9–10.3)
Chloride: 99 mmol/L (ref 98–111)
Creatinine, Ser: 1.83 mg/dL — ABNORMAL HIGH (ref 0.61–1.24)
GFR calc Af Amer: 38 mL/min — ABNORMAL LOW (ref >60)
GFR calc non Af Amer: 33 mL/min — ABNORMAL LOW (ref >60)
Glucose, Bld: 142 mg/dL — ABNORMAL HIGH (ref 70–99)
Potassium: 4.4 mmol/L (ref 3.5–5.1)
Sodium: 138 mmol/L (ref 135–145)
Total Bilirubin: 1.1 mg/dL (ref 0.3–1.2)
Total Protein: 6.4 g/dL — ABNORMAL LOW (ref 6.5–8.1)

## 2020-03-28 LAB — GLUCOSE, CAPILLARY
Glucose-Capillary: 131 mg/dL — ABNORMAL HIGH (ref 70–99)
Glucose-Capillary: 121 mg/dL — ABNORMAL HIGH (ref 70–99)
Glucose-Capillary: 167 mg/dL — ABNORMAL HIGH (ref 70–99)

## 2020-03-28 MED ORDER — GLIMEPIRIDE 2 MG PO TABS
1.0000 mg | ORAL_TABLET | Freq: Every day | ORAL | Status: DC
  Administered 2020-03-29 – 2020-04-06 (×9): 1 mg via ORAL
  Filled 2020-03-28 (×9): qty 1

## 2020-03-28 NOTE — Care Management (Signed)
Inpatient Rehabilitation Center Individual Statement of Services  Patient Name:  Martin Frazier.  Date:  03/28/2020  Welcome to the Inpatient Rehabilitation Center.  Our goal is to provide you with an individualized program based on your diagnosis and situation, designed to meet your specific needs.  With this comprehensive rehabilitation program, you will be expected to participate in at least 3 hours of rehabilitation therapies Monday-Friday, with modified therapy programming on the weekends.  Your rehabilitation program will include the following services:  Physical Therapy (PT), Occupational Therapy (OT), Speech Therapy (ST), 24 hour per day rehabilitation nursing, Therapeutic Recreaction (TR), Neuropsychology, Case Management (Social Worker), Rehabilitation Medicine, Nutrition Services, Pharmacy Services and Other  Weekly team conferences will be held on Wednesdays to discuss your progress.  Your Social Worker will talk with you frequently to get your input and to update you on team discussions.  Team conferences with you and your family in attendance may also be held.  Expected length of stay: 7-14 Days  Overall anticipated outcome: Supervision using assistive device   Depending on your progress and recovery, your program may change. Your Social Worker will coordinate services and will keep you informed of any changes. Your Social Worker's name and contact numbers are listed  below.  The following services may also be recommended but are not provided by the Inpatient Rehabilitation Center:    Home Health Rehabiltiation Services  Outpatient Rehabilitation Services    Arrangements will be made to provide these services after discharge if needed.  Arrangements include referral to agencies that provide these services.  Your insurance has been verified to be:  Medicare Your primary doctor is:  Garment/textile technologist  Pertinent information will be shared with your doctor and your insurance  company.  Social Worker:  Lavera Guise, [place] [phone] or ([phone]    Information discussed with and copy given to patient by: Andria Rhein, 03/28/2020, 10:42 AM

## 2020-03-28 NOTE — Progress Notes (Signed)
Social Work Assessment and Plan   Patient Details  Name: Martin Frazier. MRN: [id number] Date of Birth: [birth year]  Today's Date: 03/28/2020  Problem List:  Patient Active Problem List   Diagnosis Date Noted  . AKI (acute kidney injury) ([place])   . Controlled type 2 diabetes mellitus with hyperglycemia, without long-term current use of insulin ([place])   . History of hypertension   . Right thalamic infarction ([place]) 03/25/2020  . Acute CVA (cerebrovascular accident) ([place])   . Benign prostatic hyperplasia   . Diabetes mellitus type 2 in nonobese ([facility])   . Acute ischemic stroke ([place]) 03/23/2020  . Acute ischemic VBA thalamic stroke, right ([place]) 03/22/2020  . Abdominal pain, epigastric 03/26/2019  . Abnormal CT of the abdomen 03/26/2019  . Spinal stenosis of lumbar region with neurogenic claudication 05/21/2018  . Unspecified constipation 07/22/2014  . Diabetes ([place]) 07/22/2014  . Essential hypertension, benign 07/22/2014  . GERD (gastroesophageal reflux disease) 07/22/2014   Past Medical History:  Past Medical History:  Diagnosis Date  . Collapsed lung    left  . Diabetes mellitus without complication ([place])    over 10 yrs  . GERD (gastroesophageal reflux disease)   . Hypertension   . Kidney stones    Past Surgical History:  Past Surgical History:  Procedure Laterality Date  . APPENDECTOMY    . BIOPSY  04/01/2019   Procedure: BIOPSY;  Surgeon: Rogene Houston, MD;  Location[facility];  Service: Endoscopy;;  gastric  . CYSTOSCOPY W/ URETERAL STENT PLACEMENT Right 04/03/2019   Procedure: CYSTOSCOPY WITH RETROGRADE PYELOGRAM/URETERAL STENT PLACEMENT;  Surgeon: Franchot Gallo, MD;  Location: [facility];  Service: Urology;  Laterality: Right;  . ESOPHAGOGASTRODUODENOSCOPY N/A 04/01/2019   Procedure: ESOPHAGOGASTRODUODENOSCOPY (EGD);  Surgeon: Rogene Houston, MD;  Location: [facility];  Service: Endoscopy;  Laterality: N/A;  10:55am  . EXTRACORPOREAL SHOCK WAVE LITHOTRIPSY Right  04/16/2019   Procedure: EXTRACORPOREAL SHOCK WAVE LITHOTRIPSY (ESWL);  Surgeon: Festus Aloe, MD;  Location: [facility];  Service: Urology;  Laterality: Right;  . HEMORRHOID SURGERY    . KIDNEY STONE SURGERY    . TONSILLECTOMY     Social History:  reports that he has quit smoking. His smoking use included cigarettes. He has never used smokeless tobacco. He reports that he does not drink alcohol or use drugs.  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 Pt/family expectations/goals: Pt goal is to return back home with spouse  [employer]: None Premorbid Home Care/DME Agencies: None Transportation available at  discharge: Spouse able to transport  Discharge Planning Living Arrangements: Spouse/significant  other Support Systems: Children Type of Residence: Private residence(2-3 Steps to enter home) Financial Resources: Social Security Financial Screen Referred: Yes Living Expenses: Own Money Management: Patient, Spouse Does the patient have any problems obtaining your medications?: No Home Management: Spouse and Son Social Work Anticipated Follow Up Needs: HH/OP  Clinical Impression SW met with resident to introduce self, explain role and discuss d/c process. Pt is  a English as a second language teacher.   Dyanne Iha 03/28/2020, 10:36 AM

## 2020-03-28 NOTE — Progress Notes (Addendum)
Occupational Therapy Session Note  Patient Details  Name: Martin Frazier. MRN: [id number] Date of Birth: 1934-02-04  Today's Date: 03/28/2020 OT Individual Time: 7017-7939 OT Individual Time Calculation (min): 56 min   Short Term Goals: Week 1:  OT Short Term Goal 1 (Week 1): Pt will complete toilet transfers with min assist for 3 consecutive sessions using the RW for support. OT Short Term Goal 2 (Week 1): Pt will complete walk-in shower transfers with min assist for with use of the RW for support for 2 consecutive sessions. OT Short Term Goal 3 (Week 1): Pt will complete UB dressing with supervision for a pullover or button up shirt with AE PRN. OT Short Term Goal 4 (Week 1): Pt will complete all LB dressing with min guard assist sit to stand for 3 consecutive sessions.  Skilled Therapeutic Interventions/Progress Updates:    Pt greeted in bed with no c/o pain. Agreeable to shower. He completed bathing (sit<stand from TTB), dressing (w/c level sit<stand at sink), and oral care/handwashing/hair combing (standing at the sink) during session. All functional transfers completed using RW at ambulatory level with Min A. CGA for dynamic sitting and standing balance during stated self care tasks. Min cues for sequencing in the shower as he started washing himself with a dry wash cloth vs setting it up with water and soap beforehand. Pt incorporating Lt hand with min cuing, able to reach for soap bottle using affected UE and incorporate limb functionally to wash unaffected side. Note that he needed assist to don his Lt gripper sock due to onset of rib pain when attempting himself using figure 4 position. Education provided for hemi strategies and also adaptive overhead technique to don his button up shirt. Pt needed Min A for buttoning. To compensate for shoulder limitations, pt used active assist techniques bilaterally to comb his hair. At end of session pt returned to the w/c. Left him with all needs within  reach, safety belt fastened, in care of RN to receive his morning medicine.    Note that pt is very HOH, states without his hearing aide he is "deaf and tone deaf" and also has tinnitus. Hearing aide removed prior to shower.   Therapy Documentation Precautions:  Precautions Precautions: Fall, Other (comment) Precaution Comments: L hemiparesis/impaired coordination Restrictions Weight Bearing Restrictions: No ADL: ADL Eating: Supervision/safety Where Assessed-Eating: Bed level Grooming: Supervision/safety Where Assessed-Grooming: Wheelchair Upper Body Bathing: Supervision/safety Where Assessed-Upper Body Bathing: Shower Lower Body Bathing: Moderate assistance Where Assessed-Lower Body Bathing: Shower Upper Body Dressing: Minimal assistance Where Assessed-Upper Body Dressing: Wheelchair Lower Body Dressing: Moderate assistance Where Assessed-Lower Body Dressing: Wheelchair Toileting: Moderate assistance Where Assessed-Toileting: Teacher, adult education: Moderate assistance Toilet Transfer Method: Proofreader: Psychiatric nurse: Moderate assistance Film/video editor Method: Designer, industrial/product: Emergency planning/management officer      Therapy/Group: Individual Therapy  Stephana Morell A Demetria Lightsey 03/28/2020, 12:23 PM

## 2020-03-28 NOTE — Progress Notes (Signed)
PHYSICAL MEDICINE & REHABILITATION PROGRESS NOTE  Subjective/Complaints: Up in chair. Just dressed with OT. Feet up on edge of bed.   ROS: Patient denies fever, rash, sore throat, blurred vision, nausea, vomiting, diarrhea, cough, shortness of breath or chest pain, joint or back pain, headache, or mood change.    Objective: Vital Signs: Blood pressure 122/71, pulse 79, temperature 97.9 F (36.6 C), resp. rate 16, height 5\' 8"  (1.727 m), weight 60.2 kg, SpO2 97 %. No results found. Recent Labs    03/25/20 1432 03/28/20 0635  WBC 7.3 7.3  HGB 13.8 13.5  HCT 41.0 40.0  PLT 185 198   Recent Labs    03/25/20 1432 03/28/20 0635  NA  --  138  K  --  4.4  CL  --  99  CO2  --  28  GLUCOSE  --  142*  BUN  --  42*  CREATININE 1.82* 1.83*  CALCIUM  --  9.3    Physical Exam: BP 122/71 (BP Location: Left Arm)   Pulse 79   Temp 97.9 F (36.6 C)   Resp 16   Ht 5\' 8"  (1.727 m)   Wt 60.2 kg   SpO2 97%   BMI 20.18 kg/m   Constitutional: No distress . Vital signs reviewed. HEENT: EOMI, oral membranes moist Neck: supple Cardiovascular: RRR without murmur. No JVD    Respiratory/Chest: CTA Bilaterally without wheezes or rales. Normal effort    GI/Abdomen: BS +, non-tender, non-distended Ext: no clubbing, cyanosis, or edema Psych: pleasant and cooperative Skin: Warm and dry.  Intact. Musc: No edema in extremities.  No tenderness in extremities. Neurological: Alert. Good insight and awareness. Wearing hearing aid [place] contact with examiner and follows commands.   Motor: 4+-5/5 throughout, except for right shoulder, limited due to RTC injury.   Assessment/Plan: 1. Functional deficits secondary to right thalamic infarct which require 3+ hours per day of interdisciplinary therapy in a comprehensive inpatient rehab setting.  Physiatrist is providing close team supervision and 24 hour management of active medical problems listed below.  Physiatrist and rehab  team continue to assess barriers to discharge/monitor patient progress toward functional and medical goals  Care Tool:  Bathing    Body parts bathed by patient: Right arm, Left arm, Chest, Abdomen, Front perineal area, Buttocks, Right upper leg, Left upper leg, Right lower leg, Left lower leg, Face         Bathing assist Assist Level: Minimal Assistance - Patient > 75%     Upper Body Dressing/Undressing Upper body dressing   What is the patient wearing?: Pull over shirt    Upper body assist Assist Level: Minimal Assistance - Patient > 75%    Lower Body Dressing/Undressing Lower body dressing      What is the patient wearing?: Underwear/pull up, Pants     Lower body assist Assist for lower body dressing: Minimal Assistance - Patient > 75%     Toileting Toileting    Toileting assist Assist for toileting: Contact Guard/Touching assist Assistive Device Comment: urinal   Transfers Chair/bed transfer  Transfers assist     Chair/bed transfer assist level: Contact Guard/Touching assist     Locomotion Ambulation   Ambulation assist      Assist level: Contact Guard/Touching assist Assistive device: Walker-rolling Max distance: 154ft   Walk 10 feet activity   Assist     Assist level: Contact Guard/Touching assist Assistive device: Walker-rolling   Walk 50 feet activity   Assist  Assist level: Contact Guard/Touching assist Assistive device: No Device    Walk 150 feet activity   Assist Walk 150 feet activity did not occur: Safety/medical concerns  Assist level: Contact Guard/Touching assist Assistive device: Walker-rolling    Walk 10 feet on uneven surface  activity   Assist     Assist level: Minimal Assistance - Patient > 75% Assistive device: Other (comment), Hand held assist(HHA and handrail)   Wheelchair     Assist Will patient use wheelchair at discharge?: No(anticipate will be ambulatory)             Wheelchair 50  feet with 2 turns activity    Assist            Wheelchair 150 feet activity     Assist            Medical Problem List and Plan: 1.  Left side weakness secondary to right thalamic infarction on 03/22/2020  Continue CIR PT, OT   2.  Antithrombotics: -DVT/anticoagulation: Subcutaneous heparin             -antiplatelet therapy: Aspirin 325 mg daily 3. Pain Management: Tylenol as needed 4. Mood: Provide emotional support             -antipsychotic agents: N/A 5. Neuropsych: This patient is capable of making decisions on his own behalf. 6. Skin/Wound Care: Routine skin checks 7. Fluids/Electrolytes/Nutrition: Routine in and outs.   I personally reviewed the patient's labs today.    8.  Hypertension.  Patient on Altace 2.5 mg daily prior to admission.  Resume as needed  Controlled on 3/29  Monitor with increased mobility 9.  Diabetes mellitus with hyperglycemia.  Hemoglobin A1c 7.1.  Currently with SSI.  Patient on Glucophage 500 mg daily prior to admission.    Continue SSI and observe for improvement  -will initiate trial of amaryl starting at 1mg  daily given likely baseline kidney disease               10.  Hyperlipidemia.  Continue Lipitor 11.  BPH.  Flomax 0.4 mg daily.    PVRs low. 12. Recent history of tobacco abuse.  Counseling 13.  Acute on chronic KD.   Creatinine 1.82 on 3/26, repeat Cr 1.83 3/29 (low during admit 1.49)  -continue to push fluids. Appears euvolemic, +425 yesterday LOS: 3 days A FACE TO FACE EVALUATION WAS PERFORMED  4/29 03/28/2020, 1:01 PM

## 2020-03-28 NOTE — Progress Notes (Signed)
Physical Therapy Session Note  Patient Details  Name: Martin Frazier. MRN: [id number] Date of Birth: 1934-02-07  Today's Date: 03/28/2020 PT Individual Time: 1100-1158 PT Individual Time Calculation (min): 58 min   Short Term Goals: Week 1:  PT Short Term Goal 1 (Week 1): Pt will perform sit<>stands with CGA PT Short Term Goal 2 (Week 1): Pt will perform bed<>chair transfers using LRAD with CGA PT Short Term Goal 3 (Week 1): Pt will ambulate at least 173f using LRAD with CGA PT Short Term Goal 4 (Week 1): Pt will demonstrate improvement of at least 10 points on Berg Balance Test  Skilled Therapeutic Interventions/Progress Updates:    [facility]. [facility] then tolerated multiple bouts of gait from 120-178fwith min guard and RW, cues for safe pace of gait and navigation in hallway. Generally able to perform functional transfers with min guard-MinA and RW with Mod-max cues for hand placement and safe sequencing during transfers- tends to "plop" into the chair without good eccentric control. Otherwise worked on baElectronics engineerhile standing on blue foam pad in normal BOS and also in semi-tandem stance on blue foam with MinA for balance; incorporated cross midline reaches and use of L UE when tossing horseshoes. Also introduced Nustep with BUEs/LEs on resistance 3 and seat 9 for 8 minutes for improved functional activity tolerance and coordination. [facility], family present, and all other needs met this morning.   Therapy Documentation Precautions:  Precautions Precautions: Fall, Other (comment) Precaution Comments: L hemiparesis/impaired coordination Restrictions Weight Bearing Restrictions: No    Pain: Pain Assessment Pain Scale: 0-10 Pain Score: 0-No pain Faces Pain Scale: No hurt      Therapy/Group:  Individual Therapy   KrWindell NorfolkDPT, PN1   Supplemental Physical Therapist [place]  Pager [phone] Rehab Office [phone]  03/28/2020, 12:23 PM

## 2020-03-28 NOTE — Progress Notes (Signed)
Inpatient Rehabilitation  Patient information reviewed and entered into eRehab system by Aerilyn Slee M. Shiesha Jahn, M.A., CCC/SLP, PPS Coordinator.  Information including medical coding, functional ability and quality indicators will be reviewed and updated through discharge.    

## 2020-03-28 NOTE — Progress Notes (Signed)
Physical Therapy Session Note  Patient Details  Name: Martin Frazier. MRN: [id number] Date of Birth: Sep 06, 1934  Today's Date: 03/28/2020 PT Individual Time: 4098-1191 PT Individual Time Calculation (min): 68 min   Short Term Goals: Week 1:  PT Short Term Goal 1 (Week 1): Pt will perform sit<>stands with CGA PT Short Term Goal 2 (Week 1): Pt will perform bed<>chair transfers using LRAD with CGA PT Short Term Goal 3 (Week 1): Pt will ambulate at least 176f using LRAD with CGA PT Short Term Goal 4 (Week 1): Pt will demonstrate improvement of at least 10 points on Berg Balance Test  Skilled Therapeutic Interventions/Progress Updates:    [facility] but does report some fatigue from the morning. Consistently able to perform functional transfers with MinA and RW throughout session, cues for safety and sequencing as well as general safety awareness. Worked on balance based activities including semi-tandem stance, standing marches, and heel raises on foams with Min-ModA for balance and tendency to lean to the left without BUE support, provided Max cues to correct however did have difficulty with this with just VC and may benefit from increased visual feedback. Did need increased number of rest breaks due to increased back discomfort and aching this afternoon which he reports is his baseline. Able to perform supine<-> sit with S-min guard today, and worked on BLE strengthening on mat table in supine and sidelying with visual demonstration and cues for correct technique and eccentric control of all exercises. Tolerated gait training an additional approximately 1564fwith RW and min guard but limited by back pain and fatigue. Finished session by working on PVWalt Disneyuzzle in standing with no BUE support with MinA for balance on stable surface but did need Mod cues to finish part of puzzle. [facility] pain and transferred  back to bed with minA. Left in bed with all needs met, bed alarm active.   Therapy Documentation Precautions:  Precautions Precautions: Fall, Other (comment) Precaution Comments: L hemiparesis/impaired coordination Restrictions Weight Bearing Restrictions: No Pain: Pain Assessment Pain Scale: Faces Pain Score: 0-No pain Faces Pain Scale: Hurts a little bit Pain Type: Chronic pain Pain Location: Back Pain Orientation: Mid;Lower Pain Descriptors / Indicators: Discomfort Pain Onset: On-going Patients Stated Pain Goal: 0 Pain Intervention(s): Repositioned;Ambulation/increased activity   Therapy/Group: Individual Therapy   KrWindell NorfolkDPT, PN1   Supplemental Physical Therapist [place]  Pager [phone] Rehab Office [phone]  03/28/2020, 3:48 PM

## 2020-03-29 ENCOUNTER — Inpatient Hospital Stay (HOSPITAL_COMMUNITY): Payer: Medicare Other | Admitting: Physical Therapy

## 2020-03-29 ENCOUNTER — Inpatient Hospital Stay (HOSPITAL_COMMUNITY): Payer: Medicare Other | Admitting: Occupational Therapy

## 2020-03-29 LAB — GLUCOSE, CAPILLARY
Glucose-Capillary: 157 mg/dL — ABNORMAL HIGH (ref 70–99)
Glucose-Capillary: 164 mg/dL — ABNORMAL HIGH (ref 70–99)
Glucose-Capillary: 169 mg/dL — ABNORMAL HIGH (ref 70–99)
Glucose-Capillary: 156 mg/dL — ABNORMAL HIGH (ref 70–99)
Glucose-Capillary: 135 mg/dL — ABNORMAL HIGH (ref 70–99)

## 2020-03-29 NOTE — Progress Notes (Signed)
Physical Therapy Session Note  Patient Details  Name: Martin Frazier. MRN: [id number] Date of Birth: 10/23/1934  Today's Date: 03/29/2020 PT Individual Time: 0800-0855 PT Individual Time Calculation (min): 55 min   Short Term Goals: Week 1:  PT Short Term Goal 1 (Week 1): Pt will perform sit<>stands with CGA PT Short Term Goal 2 (Week 1): Pt will perform bed<>chair transfers using LRAD with CGA PT Short Term Goal 3 (Week 1): Pt will ambulate at least 15f using LRAD with CGA PT Short Term Goal 4 (Week 1): Pt will demonstrate improvement of at least 10 points on Berg Balance Test  Skilled Therapeutic Interventions/Progress Updates:    [facility] and motivated to participate this morning. Able to perform functional transfers with RW with min guard consistently today but continues to need intermittent cues for hand placement. Able to pull up new underwear/pants with no BUE support but did need MinA for balance. Tolerated gait training approximately 1750fwith min guard and RW, followed by 10 minutes on Nustep with B UEs/LEs at level 3 for 10 minutes for improved functional activity tolerance. Otherwise focused on functional balance skills via cone tapping on stable surface with Min-ModA for balance especially as challenge of activity was increased by PT. Fatigued at EOGrottoesHe was left up in his WC with seatbelt alarm active, all other needs met this morning.   Therapy Documentation Precautions:  Precautions Precautions: Fall, Other (comment) Precaution Comments: L hemiparesis/impaired coordination Restrictions Weight Bearing Restrictions: No    Pain: Pain Assessment Pain Scale: 0-10 Pain Score: 0-No pain Faces Pain Scale: No hurt    Therapy/Group: Individual Therapy   KrWindell NorfolkDPT, PN1   Supplemental Physical Therapist [place]  Pager [phone] Rehab Office [phone]  03/29/2020, 12:27 PM

## 2020-03-29 NOTE — Progress Notes (Signed)
Occupational Therapy Session Note  Patient Details  Name: Martin Frazier. MRN: [id number] Date of Birth: Apr 13, 1934  Today's Date: 03/29/2020 OT Individual Time: 0949-1100 OT Individual Time Calculation (min): 71 min    Short Term Goals: Week 1:  OT Short Term Goal 1 (Week 1): Pt will complete toilet transfers with min assist for 3 consecutive sessions using the RW for support. OT Short Term Goal 2 (Week 1): Pt will complete walk-in shower transfers with min assist for with use of the RW for support for 2 consecutive sessions. OT Short Term Goal 3 (Week 1): Pt will complete UB dressing with supervision for a pullover or button up shirt with AE PRN. OT Short Term Goal 4 (Week 1): Pt will complete all LB dressing with min guard assist sit to stand for 3 consecutive sessions.  Skilled Therapeutic Interventions/Progress Updates:    Pt completed transfer from the wheelchair to the therapy mat in the gym with min guard assist to start session.  He then worked on standing balance with LUE coordination throughout session with use of the PVC pipe puzzle.  Min guard assist to maintain standing balance while engaged in activity.  Place pieces down on the lower left side to encourage functional reach and bending.  Min guard assist to complete with RUE stabilized on bedside table in front of him at times.  Pt also completed coordination task in sitting using Box and Blocks test.  He was able to move 34 blocks with the RUE and 33 blocks with the LUE in 1 minute.  Finished session with functional mobility back to the room with min guard assist and mod demonstrational cueing to stay closer in the RW and to try and maintain upright posture.  He leans to the left and demonstrates trunk flexion during mobility.  Once in the room, he transferred to the wheelchair and continued working on LUE coordination and reach using foam pieces given to him earlier in the week.  Pt encouraged to continue working on picking them up  one at a time and translating to his palm, until he has 3-4 in place.  Then he can work on manipulating them back out to his thumb and index finger to place in the cup.  He voiced understanding and was left with the call button in reach and the safety alarm belt in place.    Therapy Documentation Precautions:  Precautions Precautions: Fall, Other (comment) Precaution Comments: L hemiparesis/impaired coordination Restrictions Weight Bearing Restrictions: No   Pain: Pain Assessment Pain Scale: 0-10 Pain Score: 0-No pain Faces Pain Scale: No hurt ADL: See Care Tool Section for some details of mobility and selfcare  Therapy/Group: Individual Therapy  Shelva Hetzer OTR/L 03/29/2020, 12:39 PM

## 2020-03-29 NOTE — Progress Notes (Signed)
Physical Therapy Session Note  Patient Details  Name: Martin Frazier. MRN: [id number] Date of Birth: Nov 26, 1934  Today's Date: 03/29/2020 PT Individual Time: 1305-1401 PT Individual Time Calculation (min): 56 min   Short Term Goals: Week 1:  PT Short Term Goal 1 (Week 1): Pt will perform sit<>stands with CGA PT Short Term Goal 2 (Week 1): Pt will perform bed<>chair transfers using LRAD with CGA PT Short Term Goal 3 (Week 1): Pt will ambulate at least 169ft using LRAD with CGA PT Short Term Goal 4 (Week 1): Pt will demonstrate improvement of at least 10 points on Berg Balance Test  Skilled Therapeutic Interventions/Progress Updates:   Pt received sitting in [facility] and agreeable to therapy. No report of pain. Ambulation 160' to therapy gym with RW and supervision for verbal and tactile cues to improve posture and upright gaze, increase step length on L.   Pt performs transfer training/balance activities on blue mat in gym. x2 sets of x10 reps of heel raises with RW support and supervision. 3rd set x10  only using RUE for support and requiring minA for occasional LOB.   x2 sets x10 reps of toe raises with RW and requiring frequent minA for posterior LOB and frequent cues for more upright posture, demonstrating consistent posterior pelvic tilt and downward gaze.   x1 set x20 reps of alternating toe-heel raises with minA for posterior LOBs.  x10 sit to stand transfers with arms folded across chest and requiring minA to encourage anterior weight shift.  Alternating toe taps on 4" step. Pt demonstrates consistent LOB to L and posterior during stance phase on LLE. x10 minutes total with several seated rest breaks.   Ambulation back to room 160' with decreased gait speed due to fatigue. Supervision  Sit>supine. All needs within reach and alarm intact.  Therapy Documentation Precautions: Precautions Precautions: Fall, Other (comment) Precaution Comments: L hemiparesis/impaired  coordination Restrictions Weight Bearing Restrictions: No    Therapy/Group: Individual Therapy  Beau Fanny, PT, DPT 03/29/2020, 3:58 PM

## 2020-03-29 NOTE — Plan of Care (Signed)
  Problem: Consults Goal: RH STROKE PATIENT EDUCATION Description: See Patient Education module for education specifics  Outcome: Progressing Goal: Diabetes Guidelines if Diabetic/Glucose > 140 Description: If diabetic or lab glucose is > 140 mg/dl - Initiate Diabetes/Hyperglycemia Guidelines & Document Interventions  Outcome: Progressing   Problem: RH BOWEL ELIMINATION Goal: RH STG MANAGE BOWEL WITH ASSISTANCE Description: STG Manage Bowel with min Assistance. Outcome: Progressing Goal: RH STG MANAGE BOWEL W/MEDICATION W/ASSISTANCE Description: STG Manage Bowel with Medication with mod I Assistance. Outcome: Progressing   Problem: RH SAFETY Goal: RH STG ADHERE TO SAFETY PRECAUTIONS W/ASSISTANCE/DEVICE Description: STG Adhere to Safety Precautions With cues/reminders Assistance/Device. Outcome: Progressing Goal: RH STG DECREASED RISK OF FALL WITH ASSISTANCE Description: STG Decreased Risk of Fall With supervision Assistance. Outcome: Progressing   Problem: RH KNOWLEDGE DEFICIT Goal: RH STG INCREASE KNOWLEDGE OF DIABETES Description: Pt will be able to manage DM with medication compliance and diet regimen and also knowing signs of hypo/hyperglycemia using handouts/booklets with mod I assist Outcome: Progressing Goal: RH STG INCREASE KNOWLEGDE OF HYPERLIPIDEMIA Description: Pt will be able to manage HLD with medication compliance and diet regimen using handouts/booklets with mod I assist Outcome: Progressing Goal: RH STG INCREASE KNOWLEDGE OF STROKE PROPHYLAXIS Description: Pt will be able to manage stroke prevention with medication compliance and diet regimen using handouts/booklets with mod I assist Outcome: Progressing   

## 2020-03-29 NOTE — Progress Notes (Signed)
[place] PHYSICAL MEDICINE & REHABILITATION PROGRESS NOTE  Subjective/Complaints: Patient seen working with therapy this morning.  He states he slept fairly well overnight, this is baseline.  ROS: Denies CP, SOB, N/V/D  Objective: Vital Signs: Blood pressure 123/71, pulse 83, temperature 98.1 F (36.7 C), temperature source Oral, resp. rate 18, height 5\' 8"  (1.727 m), weight 60.2 kg, SpO2 98 %. No results found. Recent Labs    03/28/20 0635  WBC 7.3  HGB 13.5  HCT 40.0  PLT 198   Recent Labs    03/28/20 0635  NA 138  K 4.4  CL 99  CO2 28  GLUCOSE 142*  BUN 42*  CREATININE 1.83*  CALCIUM 9.3    Physical Exam: BP 123/71 (BP Location: Right Arm)   Pulse 83   Temp 98.1 F (36.7 C) (Oral)   Resp 18   Ht 5\' 8"  (1.727 m)   Wt 60.2 kg   SpO2 98%   BMI 20.18 kg/m   Constitutional: No distress . Vital signs reviewed. HENT: Normocephalic.  Atraumatic. Eyes: EOMI. No discharge. Cardiovascular: No JVD. Respiratory: Normal effort.  No stridor. GI: Non-distended. Skin: Warm and dry.  Intact. Psych: Normal mood.  Normal behavior. Musc: No edema in extremities.  No tenderness in extremities. Neurological: Alert [facility] Makes Eye contact with examiner and follows commands.   Motor: 4+-5/5 throughout, except for right shoulder, limited due to RTC injury.   Assessment/Plan: 1. Functional deficits secondary to right thalamic infarct which require 3+ hours per day of interdisciplinary therapy in a comprehensive inpatient rehab setting.  Physiatrist is providing close team supervision and 24 hour management of active medical problems listed below.  Physiatrist and rehab team continue to assess barriers to discharge/monitor patient progress toward functional and medical goals  Care Tool:  Bathing    Body parts bathed by patient: Right arm, Left arm, Chest, Abdomen, Front perineal area, Buttocks, Right upper leg, Left upper leg, Right lower leg, Left lower leg, Face          Bathing assist Assist Level: Minimal Assistance - Patient > 75%     Upper Body Dressing/Undressing Upper body dressing   What is the patient wearing?: Pull over shirt    Upper body assist Assist Level: Minimal Assistance - Patient > 75%    Lower Body Dressing/Undressing Lower body dressing      What is the patient wearing?: Underwear/pull up, Pants     Lower body assist Assist for lower body dressing: Minimal Assistance - Patient > 75%     Toileting Toileting    Toileting assist Assist for toileting: Contact Guard/Touching assist Assistive Device Comment: urinal   Transfers Chair/bed transfer  Transfers assist     Chair/bed transfer assist level: Contact Guard/Touching assist     Locomotion Ambulation   Ambulation assist      Assist level: Contact Guard/Touching assist Assistive device: Walker-rolling Max distance: 129ft   Walk 10 feet activity   Assist     Assist level: Contact Guard/Touching assist Assistive device: Walker-rolling   Walk 50 feet activity   Assist    Assist level: Contact Guard/Touching assist Assistive device: No Device    Walk 150 feet activity   Assist Walk 150 feet activity did not occur: Safety/medical concerns  Assist level: Contact Guard/Touching assist Assistive device: Walker-rolling    Walk 10 feet on uneven surface  activity   Assist     Assist level: Minimal Assistance - Patient > 75% Assistive device: Other (comment), Hand  held assist(HHA and handrail)   Wheelchair     Assist Will patient use wheelchair at discharge?: No(anticipate will be ambulatory)             Wheelchair 50 feet with 2 turns activity    Assist            Wheelchair 150 feet activity     Assist            Medical Problem List and Plan: 1.  Left side weakness secondary to right thalamic infarction on 03/22/2020  Continue CIR 2.  Antithrombotics: -DVT/anticoagulation: Subcutaneous heparin              -antiplatelet therapy: Aspirin 325 mg daily 3. Pain Management: Tylenol as needed 4. Mood: Provide emotional support             -antipsychotic agents: N/A 5. Neuropsych: This patient is capable of making decisions on his own behalf. 6. Skin/Wound Care: Routine skin checks 7. Fluids/Electrolytes/Nutrition: Routine in and outs.  8.  Hypertension.  Patient on Altace 2.5 mg daily prior to admission.  Resume as needed  Controlled on 3/29  Monitor with increased mobility 9.  Diabetes mellitus with hyperglycemia.  Hemoglobin A1c 7.1.  Currently with SSI.  Patient on Glucophage 500 mg daily prior to admission.    Continue SSI and observe for improvement  Started on amaryl 1mg  daily given likely baseline kidney disease  CBGs elevated on 3/30              10.  Hyperlipidemia.  Continue Lipitor 11.  BPH.  Flomax 0.4 mg daily.    PVRs low. 12. Recent history of tobacco abuse.  Counseling 13.  Acute on chronic KD.   Creatinine 1.3 on 3/29  -continue to push fluids.   Continue to monitor  LOS: 4 days A FACE TO FACE EVALUATION WAS PERFORMED  Sheina Mcleish 4/29 03/29/2020, 11:15 AM

## 2020-03-30 ENCOUNTER — Inpatient Hospital Stay (HOSPITAL_COMMUNITY): Payer: Medicare Other | Admitting: Occupational Therapy

## 2020-03-30 ENCOUNTER — Inpatient Hospital Stay (HOSPITAL_COMMUNITY): Payer: Medicare Other | Admitting: Physical Therapy

## 2020-03-30 ENCOUNTER — Inpatient Hospital Stay (HOSPITAL_COMMUNITY): Payer: Medicare Other

## 2020-03-30 DIAGNOSIS — H5710 Ocular pain, unspecified eye: Secondary | ICD-10-CM

## 2020-03-30 LAB — GLUCOSE, CAPILLARY
Glucose-Capillary: 113 mg/dL — ABNORMAL HIGH (ref 70–99)
Glucose-Capillary: 102 mg/dL — ABNORMAL HIGH (ref 70–99)
Glucose-Capillary: 161 mg/dL — ABNORMAL HIGH (ref 70–99)
Glucose-Capillary: 133 mg/dL — ABNORMAL HIGH (ref 70–99)

## 2020-03-30 MED ORDER — NAPHAZOLINE-GLYCERIN 0.012-0.2 % OP SOLN
1.0000 [drp] | Freq: Four times a day (QID) | OPHTHALMIC | Status: DC | PRN
  Administered 2020-03-31 – 2020-04-01 (×2): 2 [drp] via OPHTHALMIC
  Administered 2020-04-02: 15:00:00 1 [drp] via OPHTHALMIC
  Administered 2020-04-02 – 2020-04-03 (×2): 2 [drp] via OPHTHALMIC
  Administered 2020-04-05: 22:00:00 1 [drp] via OPHTHALMIC
  Filled 2020-03-30: qty 15

## 2020-03-30 NOTE — Progress Notes (Addendum)
Physical Therapy Session Note  Patient Details  Name: Martin Frazier. MRN: [id number] Date of Birth: 1934-06-03  Today's Date: 03/30/2020 PT Individual Time:[phone] , 2992-4268   PT Time Calculation: 42 min and 71 min  Short Term Goals: Week 1:  PT Short Term Goal 1 (Week 1): Pt will perform sit<>stands with CGA PT Short Term Goal 2 (Week 1): Pt will perform bed<>chair transfers using LRAD with CGA PT Short Term Goal 3 (Week 1): Pt will ambulate at least 159ft using LRAD with CGA PT Short Term Goal 4 (Week 1): Pt will demonstrate improvement of at least 10 points on Berg Balance Test  Skilled Therapeutic Interventions/Progress Updates:     AM Session: Pt received seated in [facility] and agreeable to therapy. No report of pain. Ambulation 160' to therapy gym with CGA and no AD. Forward flexed posture and decreased stance time on L.   NM re-ed for balance training. Pt performs alternating toe taps on rubber cone with PT providing minA at pelvic to facilitate lateral weight shifts. Pt has occasional LOB. PT placed mirror in front of pt for visual feedback and pt demos improved performance and less frequent LOBs. x10 minutes total with several seated rest breaks.  Pt stands and rotates upper body to reach for safety pins placed posteriorly and to the right, then hands to therapist on the L. Pt then alternates and reaches over L shoulder. Less range demonstrated when rotating and reaching to L but no LOBs.  Pt takes forward steps and retrieves safety pins ~1' above ground, then ambulates backward to challenge dynamic balance. Cues for upright posture and increased stride length. No LOBs.  Pt ambulates 160' back to room with CGA. Left seated in WC with alarm intact and all needs within reach.   PM Session: Pt received seated in WC and agreeable to therapy. No report of pain. WC transport to therapy gym for energy conservation. Pt performs gait training/NM re-ed on treadmill with LiteGait system. CGA  for sit to stand and pt uses BUE support while PT assists donning harness.   Pt performs 3 sessions on LiteGait. Pt weight approximately 130 lbs and Litegait used to unweight to ~105 lbs for each bout.   1st: 4:27 at 0.8 mph for 338' 2nd: 3:36 at 0.8 mph for 252' 3rd: 3:30 at 0.8 mph for 245'  PT provides verbal cues to increase heel strike and bilateral stride length, LLE stance time and step height. Tactile facilitation of LLE advancement and at hips to encourage lateral weight shifts and rotation. GaitSens analytic used and show that pt step and stance symmetry improved following cuing and increased repetition. Seated rest breaks between each bout.  Pt ambulates overground without AD and CGA x2 bouts of 120', demonstrating increased L stance time and R stride length. CGA for toileting and return to [facility].   Left seated in WC with all needs within reach and alarm intact.    Therapy Documentation Precautions:  Precautions Precautions: Fall, Other (comment) Precaution Comments: L hemiparesis/impaired coordination Restrictions Weight Bearing Restrictions: No    Therapy/Group: Individual Therapy  Beau Fanny, PT, DPT 03/30/2020, 3:15 PM

## 2020-03-30 NOTE — Patient Care Conference (Signed)
Inpatient RehabilitationTeam Conference and Plan of Care Update Date: 03/30/2020   Time: 11:05 AM    Patient Name: Martin Frazier.      Medical Record Number: [id number]  Date of Birth: December 14, 1934 Sex: Male         Room/Bed: 4W21C/4W21C-01 Payor Info: Payor: MEDICARE / Plan: MEDICARE PART A AND B / Product Type: *No Product type* /    Admit Date/Time:  03/25/2020 12:51 PM  Primary Diagnosis:  Right thalamic infarction [facility])  Patient Active Problem List   Diagnosis Date Noted  . Soreness of eye   . AKI (acute kidney injury) ([place])   . Controlled type 2 diabetes mellitus with hyperglycemia, without long-term current use of insulin ([place])   . History of hypertension   . Right thalamic infarction ([place]) 03/25/2020  . Acute CVA (cerebrovascular accident) ([place])   . Benign prostatic hyperplasia   . Diabetes mellitus type 2 in nonobese ([facility])   . Acute ischemic stroke ([place]) 03/23/2020  . Acute ischemic VBA thalamic stroke, right ([place]) 03/22/2020  . Abdominal pain, epigastric 03/26/2019  . Abnormal CT of the abdomen 03/26/2019  . Spinal stenosis of lumbar region with neurogenic claudication 05/21/2018  . Unspecified constipation 07/22/2014  . Diabetes ([place]) 07/22/2014  . Essential hypertension, benign 07/22/2014  . GERD (gastroesophageal reflux disease) 07/22/2014    Expected Discharge Date: Expected Discharge Date: 04/06/20  Team Members Present: Physician leading conference: Dr. Delice Lesch Care Coodinator Present: Nestor Lewandowsky, RN, BSN, CRRN;Genie Isiaih Hollenbach, RN, MSN Nurse Present: Toy Cookey, LPN PT Present: Tereasa Coop, PT OT Present: Clyda Greener, OT SLP Present: Stormy Fabian, SLP PPS Coordinator present : Gunnar Fusi, SLP     Current Status/Progress Goal Weekly Team Focus  Bowel/Bladder   continent of b/b; LBM: 03/28  gain regular bowel pattern  assist with toileting needs prn   Swallow/Nutrition/ Hydration             ADL's   Supervision for UB bathing with min  guard for LB sit to stand.  Min assist for UB and LB bathing with min guard for transfers.  Slight ataxia with LUE functional use with decreased ability to fasten his buttons.  supervision overall  selfcare retraining, transfer training, balance retraining, neuromuscular re-education, pt/family education   Mobility   supervision bed mobility, min assist sit<>stand and stand pivot transfers using RW, gait up to 1772ft using RW with CGA/min assist, 12 steps using B HRs with min assist  supervision  dynamic standing balance, gait training with LRAD, activity tolerance, pt education, transfers   Communication             Safety/Cognition/ Behavioral Observations            Pain   no c/o pain  remain pain free  assess pain QS and prn   Skin   skin intact  maintain skin integrity  assess skin QS and prn      *See Care Plan and progress notes for long and short-term goals.     Barriers to Discharge  Current Status/Progress Possible Resolutions Date Resolved   Nursing                  PT  Decreased caregiver support;Home environment access/layout                 OT                  SLP  SW                Discharge Planning/Teaching Needs:  Resident plans to discharge home with spouse and son      Team Discussion: MD has HTN, DM, med adjustments, has CKD/AKI, monitoroing labs, eye allergy, drops ordered.  RN BS 115, cont B/B, sorbitol today, BM 3/28.  OT S UB B/D, min A LB B/D, Toilet transfers min/min guard, grooming S, LUE ataxia, h/o RTC problems, goals S.  PT S bed, CGA/min A sit to stand, 150' no device CGA, min A 12 steps, goals S, LOB backwards at times.  Has a wife.   Revisions to Treatment Plan: N/A     Medical Summary Current Status: Left side weakness secondary to right thalamic infarction on 03/22/2020 Weekly Focus/Goal: Improve mobility, eye soreness, AKI on CKD, CBGs, BP, constipation  Barriers to Discharge: Medical stability   Possible Resolutions to  Barriers: Therapies, follow labs, optimize DM/HTN meds, optimize bowel meds   Continued Need for Acute Rehabilitation Level of Care: The patient requires daily medical management by a physician with specialized training in physical medicine and rehabilitation for the following reasons: Direction of a multidisciplinary physical rehabilitation program to maximize functional independence : Yes Medical management of patient stability for increased activity during participation in an intensive rehabilitation regime.: Yes Analysis of laboratory values and/or radiology reports with any subsequent need for medication adjustment and/or medical intervention. : Yes   I attest that I was present, lead the team conference, and concur with the assessment and plan of the team.   Trish Mage 03/30/2020, 4:44 PM   Team conference was held via web/ teleconference due to COVID - 19

## 2020-03-30 NOTE — Progress Notes (Signed)
Occupational Therapy Session Note  Patient Details  Name: Martin Frazier. MRN: [id number] Date of Birth: Aug 23, 1934  Today's Date: 03/30/2020 OT Individual Time: 6440-3474 OT Individual Time Calculation (min): 74 min    Short Term Goals: Week 1:  OT Short Term Goal 1 (Week 1): Pt will complete toilet transfers with min assist for 3 consecutive sessions using the RW for support. OT Short Term Goal 2 (Week 1): Pt will complete walk-in shower transfers with min assist for with use of the RW for support for 2 consecutive sessions. OT Short Term Goal 3 (Week 1): Pt will complete UB dressing with supervision for a pullover or button up shirt with AE PRN. OT Short Term Goal 4 (Week 1): Pt will complete all LB dressing with min guard assist sit to stand for 3 consecutive sessions.  Skilled Therapeutic Interventions/Progress Updates:    Pt in bed to start session, agreeable to shower and dressing to start this session.  He was able to transfer from supine to sitting with supervision.  He then ambulated to the toilet in the bathroom with use of the RW and min assist.  One LOB posteriorly when trying to ambulate up the incline to the bathroom.  He was then able to complete all toileting tasks with min guard assist.  He then worked on transferring over to the tub bench for bathing.  Min guard assist for bathing sit to stand with use of the grab bar for support.  He then transferred out to the wheelchair for dressing and grooming tasks.  He was able to complete all dressing with min guard assist sit to stand at the sink.  Min instructional cueing throughout session for hand placement with sit to stand and to not pull up on the sink or the walker.  Finished ADL tasks with standing at the sink with min guard for brushing his teeth.  Next, had pt ambulate down to the dayroom with hand held min assist.  There he completed 1 set of 8 mins on the Nustep with resistance at level 5 and average number of steps at 60-65.   Finished session with return to the room and pt left sitting in the wheelchair.  Call button and phone in reach with safety belt in place.    Therapy Documentation Precautions:  Precautions Precautions: Fall, Other (comment) Precaution Comments: L hemiparesis/impaired coordination Restrictions Weight Bearing Restrictions: No  Pain: Pain Assessment Pain Scale: Faces Pain Score: 0-No pain ADL: See Care Tool Section for some details of mobility and selfcare  Therapy/Group: Individual Therapy  Chalet Kerwin OTR/L 03/30/2020, 9:36 AM

## 2020-03-30 NOTE — Plan of Care (Signed)
  Problem: Consults Goal: RH STROKE PATIENT EDUCATION Description: See Patient Education module for education specifics  Outcome: Progressing Goal: Diabetes Guidelines if Diabetic/Glucose > 140 Description: If diabetic or lab glucose is > 140 mg/dl - Initiate Diabetes/Hyperglycemia Guidelines & Document Interventions  Outcome: Progressing   Problem: RH BOWEL ELIMINATION Goal: RH STG MANAGE BOWEL WITH ASSISTANCE Description: STG Manage Bowel with min Assistance. Outcome: Progressing Goal: RH STG MANAGE BOWEL W/MEDICATION W/ASSISTANCE Description: STG Manage Bowel with Medication with mod I Assistance. Outcome: Progressing   Problem: RH SAFETY Goal: RH STG ADHERE TO SAFETY PRECAUTIONS W/ASSISTANCE/DEVICE Description: STG Adhere to Safety Precautions With cues/reminders Assistance/Device. Outcome: Progressing Goal: RH STG DECREASED RISK OF FALL WITH ASSISTANCE Description: STG Decreased Risk of Fall With supervision Assistance. Outcome: Progressing   Problem: RH KNOWLEDGE DEFICIT Goal: RH STG INCREASE KNOWLEDGE OF DIABETES Description: Pt will be able to manage DM with medication compliance and diet regimen and also knowing signs of hypo/hyperglycemia using handouts/booklets with mod I assist Outcome: Progressing Goal: RH STG INCREASE KNOWLEGDE OF HYPERLIPIDEMIA Description: Pt will be able to manage HLD with medication compliance and diet regimen using handouts/booklets with mod I assist Outcome: Progressing Goal: RH STG INCREASE KNOWLEDGE OF STROKE PROPHYLAXIS Description: Pt will be able to manage stroke prevention with medication compliance and diet regimen using handouts/booklets with mod I assist Outcome: Progressing

## 2020-03-30 NOTE — Progress Notes (Signed)
Martin Frazier PROGRESS NOTE  Subjective/Complaints: Patient seen, sitting up, working with therapy this morning.  He states he has a "allergy in eye".  ROS: +Eye allergy.  Denies CP, SOB, N/V/D  Objective: Vital Signs: Blood pressure (!) 117/58, pulse 77, temperature 97.8 F (36.6 C), temperature source Oral, resp. rate 18, height 5\' 8"  (1.727 m), weight 60.2 kg, SpO2 98 %. No results found. Recent Labs    03/28/20 0635  WBC 7.3  HGB 13.5  HCT 40.0  PLT 198   Recent Labs    03/28/20 0635  NA 138  K 4.4  CL 99  CO2 28  GLUCOSE 142*  BUN 42*  CREATININE 1.83*  CALCIUM 9.3    Physical Exam: BP (!) 117/58 (BP Location: Left Arm)   Pulse 77   Temp 97.8 F (36.6 C) (Oral)   Resp 18   Ht 5\' 8"  (1.727 m)   Wt 60.2 kg   SpO2 98%   BMI 20.18 kg/m   Constitutional: No distress . Vital signs reviewed. HENT: Normocephalic.  Atraumatic. Eyes: EOMI. No discharge.  No erythema, tenderness, abnormalities noted in or around left eye. Cardiovascular: No JVD. Respiratory: Normal effort.  No stridor. GI: Non-distended. Skin: Warm and dry.  Intact. Psych: Normal mood.  Normal behavior. Musc: No edema in extremities.  No tenderness in extremities. Neurological: Alert [name] Makes Eye contact with examiner and follows commands.   Motor: 4+-5/5 throughout, except for right shoulder, limited due to RTC injury.   Assessment/Plan: 1. Functional deficits secondary to right thalamic infarct which require 3+ hours per day of interdisciplinary therapy in a comprehensive inpatient rehab setting.  Physiatrist is providing close team supervision and 24 hour management of active medical problems listed below.  Physiatrist and rehab team continue to assess barriers to discharge/monitor patient progress toward functional and medical goals  Care Tool:  Bathing    Body parts bathed by patient: Right arm, Left arm, Chest, Abdomen, Front perineal area,  Buttocks, Right upper leg, Left upper leg, Right lower leg, Left lower leg, Face         Bathing assist Assist Level: Contact Guard/Touching assist     Upper Body Dressing/Undressing Upper body dressing   What is the patient wearing?: Pull over shirt, Button up shirt    Upper body assist Assist Level: Supervision/Verbal cueing    Lower Body Dressing/Undressing Lower body dressing      What is the patient wearing?: Underwear/pull up, Pants     Lower body assist Assist for lower body dressing: Contact Guard/Touching assist     Toileting Toileting    Toileting assist Assist for toileting: Minimal Assistance - Patient > 75% Assistive Device Comment: urinal   Transfers Chair/bed transfer  Transfers assist     Chair/bed transfer assist level: Minimal Assistance - Patient > 75%     Locomotion Ambulation   Ambulation assist      Assist level: Contact Guard/Touching assist Assistive device: Walker-rolling Max distance: 160'   Walk 10 feet activity   Assist     Assist level: Contact Guard/Touching assist Assistive device: Walker-rolling   Walk 50 feet activity   Assist    Assist level: Contact Guard/Touching assist Assistive device: Walker-rolling    Walk 150 feet activity   Assist Walk 150 feet activity did not occur: Safety/medical concerns  Assist level: Contact Guard/Touching assist Assistive device: Walker-rolling    Walk 10 feet on uneven surface  activity   Assist  Assist level: Minimal Assistance - Patient > 75% Assistive device: Other (comment), Hand held assist(HHA and handrail)   Wheelchair     Assist Will patient use wheelchair at discharge?: No(anticipate will be ambulatory)             Wheelchair 50 feet with 2 turns activity    Assist            Wheelchair 150 feet activity     Assist            Medical Problem List and Plan: 1.  Left side weakness secondary to right thalamic  infarction on 03/22/2020  Continue CIR  Team conference today to discuss current and goals and coordination of care, home and environmental barriers, and discharge planning with nursing, case manager, and therapies.  2.  Antithrombotics: -DVT/anticoagulation: Subcutaneous heparin             -antiplatelet therapy: Aspirin 325 mg daily 3. Pain Management: Tylenol as needed 4. Mood: Provide emotional support             -antipsychotic agents: N/A 5. Neuropsych: This patient is capable of making decisions on his own behalf. 6. Skin/Wound Care: Routine skin checks 7. Fluids/Electrolytes/Nutrition: Routine in and outs.  8.  Hypertension.  Patient on Altace 2.5 mg daily prior to admission.  Resume as needed  Controlled on 3/39  Monitor with increased mobility 9.  Diabetes mellitus with hyperglycemia.  Hemoglobin A1c 7.1.  Currently with SSI.  Patient on Glucophage 500 mg daily prior to admission.    Continue SSI and observe for improvement  Started on amaryl 1mg  daily given likely baseline kidney disease  CBGs labile on 3/31 10.  Hyperlipidemia.  Continue Lipitor 11.  BPH.  Flomax 0.4 mg daily.    PVRs low. 12. Recent history of tobacco abuse.  Counseling 13.  Acute on chronic KD.   Creatinine 1.83 on 3/29, plan to order labs for the end of the week  -continue to push fluids.   Continue to monitor 14.?  Allergy in eye  Does not appear to have any physical abnormality  Will order drops  LOS: 5 days A FACE TO FACE EVALUATION WAS PERFORMED  Martin Frazier 4/29 03/30/2020, 10:41 AM

## 2020-03-30 NOTE — Progress Notes (Signed)
Social Work Patient ID: Martin Frazier., male   DOB: [birth year], 86 y.o.   MRN: [id number]   SW went in to see patient to give update of team conference. Patient family (spouse and son) in room. Gave update to family. Gave update of how patient was doing physically, goal of patient d/c Supervision level. Informed family of discharge date of Wednesday, 04/06/20. Family in agreement with d/c date. Informed family that PT/OT would recommendation comes for therapy. Will update family of recommendations. Spouse still concerned of where medications are being sent. On 3/29 sw informed Dr. [name] that spouse would like "atorvastatin" sent to [facility] clinic. Today family is requesting medications sent to [facility], due to medication would have to be mailed and would take up to a week for patient to receive. Informed spouse that medications will be reviewed and if still needed at discharge she would prefer a script to be written for medications and we fax a copy to [facility] drug and she will drop the script to drug store. Left contact information in room of patient if family has any questions or concerns.

## 2020-03-31 ENCOUNTER — Inpatient Hospital Stay (HOSPITAL_COMMUNITY): Payer: Medicare Other

## 2020-03-31 ENCOUNTER — Inpatient Hospital Stay (HOSPITAL_COMMUNITY): Payer: Medicare Other | Admitting: Physical Therapy

## 2020-03-31 ENCOUNTER — Inpatient Hospital Stay (HOSPITAL_COMMUNITY): Payer: Medicare Other | Admitting: Occupational Therapy

## 2020-03-31 DIAGNOSIS — K5901 Slow transit constipation: Secondary | ICD-10-CM

## 2020-03-31 LAB — GLUCOSE, CAPILLARY
Glucose-Capillary: 104 mg/dL — ABNORMAL HIGH (ref 70–99)
Glucose-Capillary: 98 mg/dL (ref 70–99)
Glucose-Capillary: 143 mg/dL — ABNORMAL HIGH (ref 70–99)
Glucose-Capillary: 132 mg/dL — ABNORMAL HIGH (ref 70–99)

## 2020-03-31 MED ORDER — DOCUSATE SODIUM 100 MG PO CAPS
100.0000 mg | ORAL_CAPSULE | Freq: Two times a day (BID) | ORAL | Status: DC
  Administered 2020-03-31 – 2020-04-06 (×13): 100 mg via ORAL
  Filled 2020-03-31 (×13): qty 1

## 2020-03-31 MED ORDER — SIMETHICONE 80 MG PO CHEW
80.0000 mg | CHEWABLE_TABLET | Freq: Four times a day (QID) | ORAL | Status: DC | PRN
  Administered 2020-04-02: 15:00:00 80 mg via ORAL
  Filled 2020-03-31: qty 1

## 2020-03-31 NOTE — Progress Notes (Signed)
Occupational Therapy Session Note  Patient Details  Name: Martin Frazier. MRN: [id number] Date of Birth: 1934/06/04  Today's Date: 03/31/2020 OT Individual Time: 1105-1200 OT Individual Time Calculation (min): 55 min    Short Term Goals: Week 1:  OT Short Term Goal 1 (Week 1): Pt will complete toilet transfers with min assist for 3 consecutive sessions using the RW for support. OT Short Term Goal 2 (Week 1): Pt will complete walk-in shower transfers with min assist for with use of the RW for support for 2 consecutive sessions. OT Short Term Goal 3 (Week 1): Pt will complete UB dressing with supervision for a pullover or button up shirt with AE PRN. OT Short Term Goal 4 (Week 1): Pt will complete all LB dressing with min guard assist sit to stand for 3 consecutive sessions.  Skilled Therapeutic Interventions/Progress Updates:    Pt seen this session to focus on his activity tolerance and balance.  Pt received in w/c and declined a bath or shower today and felt that he did not need to change his clothing today. He plans to shower tomorrow. Assisted pt with donning TED hose.   He stood with min A and ambulated with RW with min A to bathroom. He has difficulty negotiating/maneuvering RW and tends to pick it up vs sliding it but he is not strong enough to ambulate without it yet.  Toileted with CGA.  Washed hands at sink and then ambulated to gym with 1 seated rest break.   Needs frequent cues to reach back prior to sitting.   In therapy gym, worked on sit to stands without UE support and standing without UE support.  Pt tends to lean back through his heels, so focused on forward weight shift.  UE exercises limited due to decreased B shoulder AROM.   With min A, pt practiced stepping R foot forward 10x, L foot 10x.  Became fatigue, rested and then walked back to his room with RW with min A and improved walker negotiating through turns, spaces.    Pt sat in wc with belt alarm on and all needs met.    Therapy Documentation Precautions:  Precautions Precautions: Fall, Other (comment) Precaution Comments: L hemiparesis/impaired coordination Restrictions Weight Bearing Restrictions: No       Pain: no c/o pain   ADL: ADL Eating: Supervision/safety Where Assessed-Eating: Bed level Grooming: Supervision/safety Where Assessed-Grooming: Wheelchair Upper Body Bathing: Supervision/safety Where Assessed-Upper Body Bathing: Shower Lower Body Bathing: Moderate assistance Where Assessed-Lower Body Bathing: Shower Upper Body Dressing: Minimal assistance Where Assessed-Upper Body Dressing: Wheelchair Lower Body Dressing: Moderate assistance Where Assessed-Lower Body Dressing: Wheelchair Toileting: Moderate assistance Where Assessed-Toileting: Glass blower/designer: Moderate assistance Toilet Transfer Method: Counselling psychologist: Geophysical data processor: Moderate assistance Social research officer, government Method: Heritage manager: Radio broadcast assistant   Therapy/Group: Individual Therapy  [place] 03/31/2020, 12:07 PM

## 2020-03-31 NOTE — Progress Notes (Signed)
Physical Therapy Session Note  Patient Details  Name: Martin Frazier. MRN: [id number] Date of Birth: 1934-05-27  Today's Date: 03/31/2020 PT Individual Time: 1450-1533 PT Individual Time Calculation (min): 43 min   Short Term Goals: Week 1:  PT Short Term Goal 1 (Week 1): Pt will perform sit<>stands with CGA PT Short Term Goal 2 (Week 1): Pt will perform bed<>chair transfers using LRAD with CGA PT Short Term Goal 3 (Week 1): Pt will ambulate at least 145ft using LRAD with CGA PT Short Term Goal 4 (Week 1): Pt will demonstrate improvement of at least 10 points on Berg Balance Test  Skilled Therapeutic Interventions/Progress Updates:     Pt received seated in [facility] and agreeable to therapy. No report of pain. CGA for sit to stand and toilet transfer. Pt performs gait training with 4lb ankle weight on dominant RLE to encourage increased weight shifting to LLE during stance phase and increased stance time. Pt ambulates bouts of 100' and 120' with minA. Pt demos improved stance time on LLE with PT providing manual facilitation of pelvic rotation and lateral weight shifting.  Pt performs x4 6" steps with BHR and PT providing minA for safety. Increased difficulty and time required to descend steps.  Pt performs Berg balance test, as detailed below. Ambulates additional 150' without ankle weight and minA from PT at pelvis.  Pt left seated in WC with alarm intact. All needs within reach.  Precautions:  Precautions Precautions: Fall, Other (comment) Precaution Comments: L hemiparesis/impaired coordination Restrictions Weight Bearing Restrictions: No  Balance: Berg Balance Test Sit to Stand: Able to stand without using hands and stabilize independently Standing Unsupported: Able to stand 2 minutes with supervision Sitting with Back Unsupported but Feet Supported on Floor or Stool: Able to sit safely and securely 2 minutes Stand to Sit: Controls descent by using hands Transfers: Able to transfer  safely, definite need of hands Standing Unsupported with Eyes Closed: Able to stand 10 seconds with supervision Standing Ubsupported with Feet Together: Needs help to attain position and unable to hold for 15 seconds From Standing, Reach Forward with Outstretched Arm: Reaches forward but needs supervision From Standing Position, Pick up Object from Floor: Able to pick up shoe, needs supervision From Standing Position, Turn to Look Behind Over each Shoulder: Turn sideways only but maintains balance Turn 360 Degrees: Needs close supervision or verbal cueing Standing Unsupported, Alternately Place Feet on Step/Stool: Needs assistance to keep from falling or unable to try Standing Unsupported, One Foot in Front: Loses balance while stepping or standing Standing on One Leg: Unable to try or needs assist to prevent fall Total Score: 27   Therapy/Group: Individual Therapy  Beau Fanny, PT, DPT 03/31/2020, 4:21 PM

## 2020-03-31 NOTE — Progress Notes (Signed)
Physical Therapy Session Note  Patient Details  Name: Martin Frazier. MRN: [id number] Date of Birth: 06/22/1934  Today's Date: 03/31/2020 PT Individual Time: 0915-0958 PT Individual Time Calculation (min): 43 min   Short Term Goals: Week 1:  PT Short Term Goal 1 (Week 1): Pt will perform sit<>stands with CGA PT Short Term Goal 2 (Week 1): Pt will perform bed<>chair transfers using LRAD with CGA PT Short Term Goal 3 (Week 1): Pt will ambulate at least 164ft using LRAD with CGA PT Short Term Goal 4 (Week 1): Pt will demonstrate improvement of at least 10 points on Berg Balance Test  Skilled Therapeutic Interventions/Progress Updates:   NMR: During functional balance re-training during dressing tasks for pants and shirt incorporating sit <> stands and functional dynamic standing balance requiring overall min assist initially and progressing to CGA functionally. Forward, retro, and lateral side stepping to challenge balance, coordination, and functional mobility re-training. While on foam wedge, pt engaged in dynamic standing balance activity for ankle strategies and hip strategies while performing dynamic reaching task across midline with min assist. Dynamic gait while performing dual task activity of throwing and catching ball while walking requiring overall min assist. Pt able to pick up item from floor with min assist for balance.   Seated and standing therex for functional strengthening including seated LAQ, marches, and standing heel raises during dynamic rest breaks about 15 reps each BLE.  Fall risk assessment performed (TUG) and results discussed with patient indicating high fall risk without AD.  Gait training on unit without AD to increase challenge of balance including navigation of obstacles, turning, and various surfaces with overall CGA to min assist with decreasing assist needed as session progressed. Noted to have flexed posture, decreased hip extension, and decreased control of  stepping/heel strike.   Therapy Documentation Precautions:  Precautions Precautions: Fall, Other (comment) Precaution Comments: L hemiparesis/impaired coordination Restrictions Weight Bearing Restrictions: No Pain:  Denies pain.    Balance: Standardized Balance Assessment Standardized Balance Assessment: Timed Up and Go Test Timed Up and Go Test TUG: Normal TUG Normal TUG (seconds): 18(avg3 trials (18, 19, 20) without AD)    Therapy/Group: Individual Therapy  Karolee Stamps Darrol Poke, PT, DPT, CBIS  03/31/2020, 10:10 AM

## 2020-03-31 NOTE — Progress Notes (Signed)
Physical Therapy Session Note  Patient Details  Name: Martin Frazier. MRN: [id number] Date of Birth: 23-Sep-1934  Today's Date: 03/31/2020 PT Individual Time: 1620-1700 PT Individual Time Calculation (min): 40 min   Short Term Goals: Week 1:  PT Short Term Goal 1 (Week 1): Pt will perform sit<>stands with CGA PT Short Term Goal 2 (Week 1): Pt will perform bed<>chair transfers using LRAD with CGA PT Short Term Goal 3 (Week 1): Pt will ambulate at least 11f using LRAD with CGA PT Short Term Goal 4 (Week 1): Pt will demonstrate improvement of at least 10 points on Berg Balance Test  Skilled Therapeutic Interventions/Progress Updates:   [facility] PT treatment focused on dynamic gait training with and without AD throughout gait trainign CGA-min assist without AD and supervision assist with RW. Pt navigated 4 cones x 2, 4 canes in floor x 2, across foam mat x 4. One near LOB on mat without AD requiring Mod assist to prevent LOB.   Dynamic balance training while engaged in fine motor task of moderate difficulty peg board puzzle. Min cues to correct posterior bias with fatigue.  Gait training back to room with RW and supervision assist and from PT with min cues for symmetry and Ad management in turns.  [facility] to [facility] and all needs met.         Therapy Documentation Precautions:  Precautions Precautions: Fall, Other (comment) Precaution Comments: L hemiparesis/impaired coordination Restrictions Weight Bearing Restrictions: No    Pain:   denies  Therapy/Group: Individual Therapy  ALorie Phenix4/01/2020, 5:15 PM

## 2020-03-31 NOTE — Progress Notes (Signed)
[place] PHYSICAL MEDICINE & REHABILITATION PROGRESS NOTE  Subjective/Complaints: Patient seen sitting up in his chair this AM.  He states he did not sleep well overnight because after he received his laxative he had to await staff assisstance and as a result had to "hold it in".  He had incomplete emptying after that, he felt. He notes improvement in left eye.   ROS: Denies CP, SOB, N/V/D  Objective: Vital Signs: Blood pressure 127/67, pulse 78, temperature 97.7 F (36.5 C), temperature source Oral, resp. rate 18, height 5\' 8"  (1.727 m), weight 60.2 kg, SpO2 98 %. No results found. No results for input(s): WBC, HGB, HCT, PLT in the last 72 hours. No results for input(s): NA, K, CL, CO2, GLUCOSE, BUN, CREATININE, CALCIUM in the last 72 hours.  Physical Exam: BP 127/67 (BP Location: Left Arm)   Pulse 78   Temp 97.7 F (36.5 C) (Oral)   Resp 18   Ht 5\' 8"  (1.727 m)   Wt 60.2 kg   SpO2 98%   BMI 20.18 kg/m   Constitutional: No distress . Vital signs reviewed. HENT: Normocephalic.  Atraumatic. Eyes: EOMI. No discharge. Cardiovascular: No JVD. Respiratory: Normal effort.  No stridor. GI: Non-distended. Skin: Warm and dry.  Intact. Psych: Normal mood.  Normal behavior. Musc: No edema in extremities.  No tenderness in extremities. Neurological: Alert [facility] Makes Eye contact with examiner and follows commands.   Motor: 4+-5/5 throughout, except for right shoulder, limited due to RTC injury, stable.   Assessment/Plan: 1. Functional deficits secondary to right thalamic infarct which require 3+ hours per day of interdisciplinary therapy in a comprehensive inpatient rehab setting.  Physiatrist is providing close team supervision and 24 hour management of active medical problems listed below.  Physiatrist and rehab team continue to assess barriers to discharge/monitor patient progress toward functional and medical goals  Care Tool:  Bathing    Body parts bathed by patient:  Right arm, Left arm, Chest, Abdomen, Front perineal area, Buttocks, Right upper leg, Left upper leg, Right lower leg, Left lower leg, Face         Bathing assist Assist Level: Contact Guard/Touching assist     Upper Body Dressing/Undressing Upper body dressing   What is the patient wearing?: Pull over shirt, Button up shirt    Upper body assist Assist Level: Supervision/Verbal cueing    Lower Body Dressing/Undressing Lower body dressing      What is the patient wearing?: Underwear/pull up, Pants     Lower body assist Assist for lower body dressing: Contact Guard/Touching assist     Toileting Toileting    Toileting assist Assist for toileting: Minimal Assistance - Patient > 75% Assistive Device Comment: urinal   Transfers Chair/bed transfer  Transfers assist     Chair/bed transfer assist level: Minimal Assistance - Patient > 75%     Locomotion Ambulation   Ambulation assist      Assist level: Contact Guard/Touching assist Assistive device: Walker-rolling Max distance: 160'   Walk 10 feet activity   Assist     Assist level: Contact Guard/Touching assist Assistive device: Walker-rolling   Walk 50 feet activity   Assist    Assist level: Contact Guard/Touching assist Assistive device: Walker-rolling    Walk 150 feet activity   Assist Walk 150 feet activity did not occur: Safety/medical concerns  Assist level: Contact Guard/Touching assist Assistive device: Walker-rolling    Walk 10 feet on uneven surface  activity   Assist     Assist  level: Minimal Assistance - Patient > 75% Assistive device: Other (comment), Hand held assist(HHA and handrail)   Wheelchair     Assist Will patient use wheelchair at discharge?: No(anticipate will be ambulatory)             Wheelchair 50 feet with 2 turns activity    Assist            Wheelchair 150 feet activity     Assist            Medical Problem List and  Plan: 1.  Left side weakness secondary to right thalamic infarction on 03/22/2020  Continue CIR 2.  Antithrombotics: -DVT/anticoagulation: Subcutaneous heparin             -antiplatelet therapy: Aspirin 325 mg daily 3. Pain Management: Tylenol as needed 4. Mood: Provide emotional support             -antipsychotic agents: N/A 5. Neuropsych: This patient is capable of making decisions on his own behalf. 6. Skin/Wound Care: Routine skin checks 7. Fluids/Electrolytes/Nutrition: Routine in and outs.  8.  Hypertension.  Patient on Altace 2.5 mg daily prior to admission.  Resume as needed  Controlled on 4/1  Monitor with increased mobility 9.  Diabetes mellitus with hyperglycemia.  Hemoglobin A1c 7.1.  Currently with SSI.  Patient on Glucophage 500 mg daily prior to admission.    Continue SSI and observe for improvement  Started on amaryl 1mg  daily given likely baseline kidney disease  Slightly labile on 4/1, monitor for trend 10.  Hyperlipidemia.  Continue Lipitor 11.  BPH.  Flomax 0.4 mg daily.    PVRs low. 12. Recent history of tobacco abuse.  Counseling 13.  Acute on chronic KD.   Creatinine 1.83 on 3/29, labs ordered for tomorrow  -continue to push fluids.   Continue to monitor 14.?  Allergy in eye  Does not appear to have any physical abnormality  Cont drops  Improving 15. Slow transit constipation  Colace started    LOS: 6 days A FACE TO FACE EVALUATION WAS PERFORMED   4/29 03/31/2020, 10:09 AM

## 2020-04-01 ENCOUNTER — Inpatient Hospital Stay (HOSPITAL_COMMUNITY): Payer: Medicare Other | Admitting: Physical Therapy

## 2020-04-01 ENCOUNTER — Inpatient Hospital Stay (HOSPITAL_COMMUNITY): Payer: Medicare Other | Admitting: Occupational Therapy

## 2020-04-01 ENCOUNTER — Inpatient Hospital Stay (HOSPITAL_COMMUNITY): Payer: Medicare Other

## 2020-04-01 LAB — BASIC METABOLIC PANEL
Anion gap: 11 (ref 5–15)
BUN: 41 mg/dL — ABNORMAL HIGH (ref 8–23)
CO2: 23 mmol/L (ref 22–32)
Calcium: 9.3 mg/dL (ref 8.9–10.3)
Chloride: 104 mmol/L (ref 98–111)
Creatinine, Ser: 1.64 mg/dL — ABNORMAL HIGH (ref 0.61–1.24)
GFR calc Af Amer: 44 mL/min — ABNORMAL LOW (ref >60)
GFR calc non Af Amer: 38 mL/min — ABNORMAL LOW (ref >60)
Glucose, Bld: 125 mg/dL — ABNORMAL HIGH (ref 70–99)
Potassium: 4.4 mmol/L (ref 3.5–5.1)
Sodium: 138 mmol/L (ref 135–145)

## 2020-04-01 LAB — GLUCOSE, CAPILLARY
Glucose-Capillary: 104 mg/dL — ABNORMAL HIGH (ref 70–99)
Glucose-Capillary: 110 mg/dL — ABNORMAL HIGH (ref 70–99)
Glucose-Capillary: 149 mg/dL — ABNORMAL HIGH (ref 70–99)
Glucose-Capillary: 137 mg/dL — ABNORMAL HIGH (ref 70–99)

## 2020-04-01 MED ORDER — POLYETHYLENE GLYCOL 3350 17 G PO PACK
17.0000 g | PACK | Freq: Every day | ORAL | Status: DC
  Administered 2020-04-01 – 2020-04-04 (×4): 17 g via ORAL
  Filled 2020-04-01 (×5): qty 1

## 2020-04-01 NOTE — Progress Notes (Signed)
Occupational Therapy Weekly Progress Note  Patient Details  Name: Martin Frazier. MRN: [id number] Date of Birth: February 26, 1934  Beginning of progress report period: March 26, 2020 End of progress report period: April 01, 2020  Today's Date: 04/01/2020 OT Individual Time: 5277-8242 OT Individual Time Calculation (min): 60 min    Patient has met 4 of 4 short term goals.  Pt has been improving well with his balance, coordination and activity tolerance. He only needs CGA in dynamic standing during self care due to posterior lean.   Patient continues to demonstrate the following deficits: muscle weakness, decreased cardiorespiratoy endurance, unbalanced muscle activation and decreased coordination and decreased standing balance, decreased postural control and decreased balance strategies and therefore will continue to benefit from skilled OT intervention to enhance overall performance with BADL.  Patient progressing toward long term goals..  Continue plan of care.  OT Short Term Goals Week 1:  OT Short Term Goal 1 (Week 1): Pt will complete toilet transfers with min assist for 3 consecutive sessions using the RW for support. OT Short Term Goal 1 - Progress (Week 1): Met OT Short Term Goal 2 (Week 1): Pt will complete walk-in shower transfers with min assist for with use of the RW for support for 2 consecutive sessions. OT Short Term Goal 2 - Progress (Week 1): Met OT Short Term Goal 3 (Week 1): Pt will complete UB dressing with supervision for a pullover or button up shirt with AE PRN. OT Short Term Goal 3 - Progress (Week 1): Met OT Short Term Goal 4 (Week 1): Pt will complete all LB dressing with min guard assist sit to stand for 3 consecutive sessions. OT Short Term Goal 4 - Progress (Week 1): Met Week 2:  OT Short Term Goal 1 (Week 2): STGs = LTGS  Skilled Therapeutic Interventions/Progress Updates:      Pt seen for BADL retraining of toileting, bathing, and dressing with a focus on dynamic  standing balance and activity tolerance. See ADL documentation below. Overall pt did very well but does continue to have a posterior lean in standing as he puts his base of support over his heels and needs frequent cues to shift weight over balls of feet.   Discussed the need for a shower seat but pt is concerned the shower stall is too small. He will discuss with his wife on Saturday.  Pt resting in wc with chair alarm on and all needs met.   Therapy Documentation Precautions:  Precautions Precautions: Fall, Other (comment) Precaution Comments: L hemiparesis/impaired coordination Restrictions Weight Bearing Restrictions: No       Pain: no c/o pain    ADL: ADL Eating: Independent Where Assessed-Eating: Bed level Grooming: Setup Where Assessed-Grooming: Wheelchair Upper Body Bathing: Supervision/safety Where Assessed-Upper Body Bathing: Shower Lower Body Bathing: Supervision/safety Where Assessed-Lower Body Bathing: Shower Upper Body Dressing: Supervision/safety Where Assessed-Upper Body Dressing: Standing at sink, Sitting at sink Lower Body Dressing: Contact guard Where Assessed-Lower Body Dressing: Standing at sink, Sitting at sink Toileting: Contact guard Where Assessed-Toileting: Glass blower/designer: Therapist, music Method: Counselling psychologist: Energy manager: Curator Method: Heritage manager: Radio broadcast assistant, Grab bars   Therapy/Group: Individual Therapy  [place] 04/01/2020, 12:25 PM

## 2020-04-01 NOTE — Progress Notes (Signed)
Physical Therapy Session Note  Patient Details  Name: Martin Frazier. MRN: [id number] Date of Birth: 23-May-1934  Today's Date: 04/01/2020 PT Individual Time: 8546-2703 PT Individual Time Calculation (min): 55 min   Short Term Goals: Week 2:    STG=LTG due to ELOS  Skilled Therapeutic Interventions/Progress Updates:   Pt received supine in bed and agreeable to PT. Supine>sit transfer without assist or cues. pt donned shoes sitting EOB with supervision assist and increased time for safety. PT treatment focused on dynamic gait training in simulated community environment and dynamic balance training. Gait training performed in hall of rehab unit x 137f and 1562fwith supervision assist and RW. Cues for step length on the R and increased foot clearance on the L. Pt also performed gait training in hospital food court and through [employer]. [id number] 175 with supervision A for safety and AD management in turns to keep COM within BOS. Pt instructed in dynamic balance training while engaged in fine motor task of wii bowling. Pt able to maintain balance with supervision assist-CGA for entire 10 frames with only one  Near LOB posteriorly, requiring CGA and cues for ankle strategy to correct. Pt performed urinaiton at toilet with UE support and rail and supervision assist. Patient returned to room and [facility] in reach and all needs met.          Therapy Documentation Precautions:  Precautions Precautions: Fall, Other (comment) Precaution Comments: L hemiparesis/impaired coordination Restrictions Weight Bearing Restrictions: No Pain: denies   Therapy/Group: Individual Therapy  AusLorie Phenix2/2021, 5:27 PM

## 2020-04-01 NOTE — Progress Notes (Signed)
[facility] PHYSICAL MEDICINE & REHABILITATION PROGRESS NOTE  Subjective/Complaints: Patient seen sitting up in his chair this morning.  He states he slept well overnight.  He notes improvement in his eyes.  He notes no progress in bowel movements.  ROS: + Constipation.  Denies CP, SOB, N/V/D  Objective: Vital Signs: Blood pressure 125/62, pulse 75, temperature 97.9 F (36.6 C), temperature source Oral, resp. rate 18, height 5\' 8"  (1.727 m), weight 60.2 kg, SpO2 98 %. No results found. No results for input(s): WBC, HGB, HCT, PLT in the last 72 hours. Recent Labs    04/01/20 0624  NA 138  K 4.4  CL 104  CO2 23  GLUCOSE 125*  BUN 41*  CREATININE 1.64*  CALCIUM 9.3    Physical Exam: BP 125/62 (BP Location: Right Arm)   Pulse 75   Temp 97.9 F (36.6 C) (Oral)   Resp 18   Ht 5\' 8"  (1.727 m)   Wt 60.2 kg   SpO2 98%   BMI 20.18 kg/m   Constitutional: No distress . Vital signs reviewed. HENT: Normocephalic.  Atraumatic. Eyes: EOMI. No discharge. Cardiovascular: No JVD. Respiratory: Normal effort.  No stridor. GI: Non-distended. Skin: Warm and dry.  Intact. Psych: Normal mood.  Normal behavior. Musc: No edema in extremities.  No tenderness in extremities. Neurological: Alert [facility] Makes Eye contact with examiner and follows commands.   Motor: 4+-5/5 throughout, except for right shoulder, limited due to RTC injury, unchanged.   Assessment/Plan: 1. Functional deficits secondary to right thalamic infarct which require 3+ hours per day of interdisciplinary therapy in a comprehensive inpatient rehab setting.  Physiatrist is providing close team supervision and 24 hour management of active medical problems listed below.  Physiatrist and rehab team continue to assess barriers to discharge/monitor patient progress toward functional and medical goals  Care Tool:  Bathing    Body parts bathed by patient: Right arm, Left arm, Chest, Abdomen, Front perineal area, Buttocks,  Right upper leg, Left upper leg, Right lower leg, Left lower leg, Face         Bathing assist Assist Level: Contact Guard/Touching assist     Upper Body Dressing/Undressing Upper body dressing   What is the patient wearing?: Pull over shirt, Button up shirt    Upper body assist Assist Level: Supervision/Verbal cueing    Lower Body Dressing/Undressing Lower body dressing      What is the patient wearing?: Underwear/pull up, Pants     Lower body assist Assist for lower body dressing: Contact Guard/Touching assist     Toileting Toileting    Toileting assist Assist for toileting: Contact Guard/Touching assist Assistive Device Comment: urinal   Transfers Chair/bed transfer  Transfers assist     Chair/bed transfer assist level: Contact Guard/Touching assist     Locomotion Ambulation   Ambulation assist      Assist level: Minimal Assistance - Patient > 75% Assistive device: No Device Max distance: 150'   Walk 10 feet activity   Assist     Assist level: Contact Guard/Touching assist Assistive device: No Device   Walk 50 feet activity   Assist    Assist level: Contact Guard/Touching assist Assistive device: No Device    Walk 150 feet activity   Assist Walk 150 feet activity did not occur: Safety/medical concerns  Assist level: Minimal Assistance - Patient > 75% Assistive device: No Device    Walk 10 feet on uneven surface  activity   Assist     Assist level:  Minimal Assistance - Patient > 75% Assistive device: Other (comment), Hand held assist(HHA and handrail)   Wheelchair     Assist Will patient use wheelchair at discharge?: No(anticipate will be ambulatory)             Wheelchair 50 feet with 2 turns activity    Assist            Wheelchair 150 feet activity     Assist            Medical Problem List and Plan: 1.  Left side weakness secondary to right thalamic infarction on 03/22/2020  Continue  CIR 2.  Antithrombotics: -DVT/anticoagulation: Subcutaneous heparin             -antiplatelet therapy: Aspirin 325 mg daily 3. Pain Management: Tylenol as needed 4. Mood: Provide emotional support             -antipsychotic agents: N/A 5. Neuropsych: This patient is capable of making decisions on his own behalf. 6. Skin/Wound Care: Routine skin checks 7. Fluids/Electrolytes/Nutrition: Routine in and outs.  8.  Hypertension.  Patient on Altace 2.5 mg daily prior to admission.  Resume as needed  Controlled on 4/2  Monitor with increased mobility 9.  Diabetes mellitus with hyperglycemia.  Hemoglobin A1c 7.1.  Currently with SSI.  Patient on Glucophage 500 mg daily prior to admission.    Continue SSI and observe for improvement  Started on amaryl 1mg  daily given likely baseline kidney disease  Slightly labile, but improving on 4/2 10.  Hyperlipidemia.  Continue Lipitor 11.  BPH.  Flomax 0.4 mg daily.    PVRs low. 12. Recent history of tobacco abuse.  Counseling 13.  Acute on chronic KD.   Creatinine 1.64 on 4/2  Continue to push fluids.   Continue to monitor 14.?  Allergy in eye  Does not appear to have any physical abnormality  Cont drops  Improving 15. Slow transit constipation  Colace started 4/1  Miralax started on 4/2    LOS: 7 days A FACE TO FACE EVALUATION WAS PERFORMED  Jacci Ruberg 6/1 04/01/2020, 9:45 AM

## 2020-04-01 NOTE — Progress Notes (Signed)
Physical Therapy Weekly Progress Note  Patient Details  Name: Martin Frazier. MRN: [id number] Date of Birth: 1934-05-16  Beginning of progress report period: March 26, 2020 End of progress report period: April 01, 2020  Today's Date: 04/01/2020 PT Individual Time: 9379-0240 PT Individual Time Calculation (min): 46 min   Patient has met 4 of 4 short term goals.  Mr. Labonte is progressing well towards functional goals. Pt is performing bed mobility and functional transfers at [facility] level without use of AD. Pt is able to ambulate with CGA/min A for safety and occasional LOB. Pt has very short bilateral stride lengths with some tendency for shuffling but is able to correct with cuing. Pt has forward flexed posture with posterior pelvic tilt and tends to lose balance posteriorly, and does not have quick enough righting reactions to consistently prevent LOB, requiring min assist. Pt is able to ambulate with RW and CGA/supervision assist but still requiring CGA/minA for ambulation without AD.   Patient continues to demonstrate the following deficits muscle weakness, impaired timing and sequencing, decreased coordination and decreased motor planning and decreased sitting balance, decreased standing balance, decreased postural control and decreased balance strategies and therefore will continue to benefit from skilled PT intervention to increase functional independence with mobility.  Patient progressing toward long term goals.  Continue plan of care.   PT Short Term Goals Week 1:  PT Short Term Goal 1 (Week 1): Pt will perform sit<>stands with CGA PT Short Term Goal 1 - Progress (Week 1): Met PT Short Term Goal 2 (Week 1): Pt will perform bed<>chair transfers using LRAD with CGA PT Short Term Goal 2 - Progress (Week 1): Met PT Short Term Goal 3 (Week 1): Pt will ambulate at least 143f using LRAD with CGA PT Short Term Goal 3 - Progress (Week 1): Met PT Short Term Goal 4 (Week 1): Pt will demonstrate  improvement of at least 10 points on Berg Balance Test PT Short Term Goal 4 - Progress (Week 1): Met Week 2:  PT Short Term Goal 1 (Week 2): = to LTGs based on ELOS   Skilled Therapeutic Interventions/Progress Updates:  Ambulation/gait training;Community reintegration;DME/adaptive equipment instruction;Neuromuscular re-education;Psychosocial support;Stair training;UE/LE Strength taining/ROM;Balance/vestibular training;Discharge planning;Functional electrical stimulation;Pain management;Skin care/wound management;Therapeutic Activities;UE/LE Coordination activities;Cognitive remediation/compensation;Disease management/prevention;Functional mobility training;Patient/family education;Therapeutic Exercise;Visual/perceptual remediation/compensation;Splinting/orthotics   Pt received sitting in w/c and agreeable to therapy session. Sit<>stands without AD with CGA for steadying throughout session. Ambulated short distances in room without AD to participate in the following balance tasks with CGA for steadying - continues to demonstrates decreased B LE step lengths, decreased trunk rotation, and decreased B LE foot clearance. Participated in the following dynamic standing balance tasks:  - hip strategy balance retraining against a wall focusing on recovery from posterior lean/LOB with max multimodal cuing for sequencing of shoulder/hip sequencing and initially mod assist to bring hips forward off of wall progressed to min assist - forward/backwards stepping over target focusing on increased backwards step length with min assist for balance - stepping strategy retraining with pt leaning posteriorly against minor posterior displacement of center of gravity with pt requiring up total assist to prevent posterior LOB but with increased repetitions demonstrates improved speed and backwards step length to recover balance with min assist Pt left seated in w/c with needs in reach and seat belt alarm on.   Therapy  Documentation Precautions:  Precautions Precautions: Fall, Other (comment) Precaution Comments: L hemiparesis/impaired coordination Restrictions Weight Bearing Restrictions: No  Pain: Reports slight low  back pain during activities - provided seated rest breaks for pain management.    Therapy/Group: Individual Therapy  Gwen Pounds, PT, DPT 04/01/2020, 12:44 PM

## 2020-04-01 NOTE — Progress Notes (Signed)
Physical Therapy Session Note  Patient Details  Name: Martin Frazier. MRN: [id number] Date of Birth: 10-17-1934  Today's Date: 04/01/2020 PT Individual Time: 1420-1504 PT Individual Time Calculation (min): 44 min   Short Term Goals: Week 2:  PT Short Term Goal 1 (Week 2): = to LTGs based on ELOS  Skilled Therapeutic Interventions/Progress Updates:    Pt received seated in WC and agreeable to therapy.  Sit to stand and ambulation with RW 150' with supervision. PT cues for upright gaze, increased bilateral stride length and especially RLE to increase stance time on L.   Pt performs ther-ex on mat in therapy gym. Supine bridges 4x10. 2nd set pt holds 1lb bar with BUE to prevent use of elbows and increase difficulty. 3rd set pt performs with just RLE and PT support LLE. 4th set pt performs with LLE and PT supporting RLE.  Pt performs reaching activity in squat position to encourage anterior weight shifting and BLE strengthening. Pt performs sit>squat from mat and reaches for cups on ground 2-3' in front of pt. PT providing CGA/minA at hips for safety.   Pt performs NM re-ed, standing on red foam wedge to encourage anterior weight shift. PT providing CGA/minA for safety. Pt performs trunk rotation and has x2-3 LOBs posteriorly. Progresses to standing on steeper incline of blue wedge. Pt able to stand 2-3 minutes with minA. Reports increasing pain of chronic nature in back and lower cervical area following activity. Numerical rating not provided. PT adjusts positioning for comfort.  Pt ambulates 150' with RW back to room with supervision. Supervision for sit>supin.  Left supine in bed. All needs within reach. Alarm intact.  Therapy Documentation Precautions:  Precautions Precautions: Fall, Other (comment) Precaution Comments: L hemiparesis/impaired coordination Restrictions Weight Bearing Restrictions: No  Therapy/Group: Individual Therapy  Beau Fanny, PT, DPT 04/01/2020, 6:55 PM

## 2020-04-02 ENCOUNTER — Inpatient Hospital Stay (HOSPITAL_COMMUNITY): Payer: Medicare Other | Admitting: Physical Therapy

## 2020-04-02 LAB — GLUCOSE, CAPILLARY
Glucose-Capillary: 126 mg/dL — ABNORMAL HIGH (ref 70–99)
Glucose-Capillary: 79 mg/dL (ref 70–99)
Glucose-Capillary: 140 mg/dL — ABNORMAL HIGH (ref 70–99)
Glucose-Capillary: 143 mg/dL — ABNORMAL HIGH (ref 70–99)

## 2020-04-02 MED ORDER — LORATADINE 10 MG PO TABS
10.0000 mg | ORAL_TABLET | Freq: Every day | ORAL | Status: DC
  Administered 2020-04-02 – 2020-04-06 (×5): 10 mg via ORAL
  Filled 2020-04-02 (×5): qty 1

## 2020-04-02 MED ORDER — SORBITOL 70 % SOLN
60.0000 mL | Status: AC
  Administered 2020-04-02: 12:00:00 60 mL via ORAL
  Filled 2020-04-02: qty 60

## 2020-04-02 NOTE — Progress Notes (Signed)
Physical Therapy Session Note  Patient Details  Name: Martin Frazier. MRN: [id number] Date of Birth: [birth year]  Today's Date: 04/02/2020 PT Individual Time: 1027-1057 PT Individual Time Calculation (min): 30 min   Short Term Goals: Week 2:  PT Short Term Goal 1 (Week 2): = to LTGs based on ELOS  Skilled Therapeutic Interventions/Progress Updates:   Pt received supine in bed and agreeable to therapy session. Pt reports "feeling a little better" stating he feels like he needs to have a BM but hasn't had any success. Supine>sitting R EOB, HOB slightly elevated and increased time with supervision for safety. Sit<>stands using RW with CGA for steadying. Ambulated ~82ft x2 to/from bathroom using RW with CGA for steadying - cuing for turning AD and keeping it close when going to sit on toilet. Standing with CGA for steadying and L UE support on grab bar performed LB clothing management without assist. Sit<>stand toilet<>RW/grab bar with CGA for steadying. Continent of bladder only performed seated peri-care without assist. Standing hand hygiene at stink again with CGA for steadying. Sitting EOB performed UB and LB dressing with min assist and increased time. Pt's wife and son arrived. Therapist provided general information regarding pt's CLOF, need to use RW, need for 24hr support, and recommended follow-up therapy. Gait training ~49ft, no AD, with CGA/min assist for steadying - continues to demonstrated decreased gait speed with shuffling pattern. Pt's family reports his walking looks similar to PTA to hospital. Pt left seated in w/c with needs in reach and chair alarm on.  Therapy Documentation Precautions:  Precautions Precautions: Fall, Other (comment) Precaution Comments: L hemiparesis/impaired coordination Restrictions Weight Bearing Restrictions: No  Pain:   No reports of pain throughout session.  Therapy/Group: Individual Therapy  Ginny Forth, PT, DPT 04/02/2020, 7:57 AM

## 2020-04-02 NOTE — Progress Notes (Signed)
Patient received 60 of sorbitol per MD order due to constipation. Patient noted to have 3 BM'S so far this shift. Soap suds enema not required at this time.

## 2020-04-02 NOTE — Progress Notes (Signed)
[place] PHYSICAL MEDICINE & REHABILITATION PROGRESS NOTE  Subjective/Complaints: Left eye watering/itchy. Slept well. Anxious to get home next week  ROS: Patient denies fever, rash, sore throat, blurred vision, nausea, vomiting, diarrhea, cough, shortness of breath or chest pain, joint or back pain, headache, or mood change.    Objective: Vital Signs: Blood pressure 120/63, pulse 74, temperature 97.8 F (36.6 C), temperature source Oral, resp. rate 18, height 5\' 8"  (1.727 m), weight 58.1 kg, SpO2 100 %. No results found. No results for input(s): WBC, HGB, HCT, PLT in the last 72 hours. Recent Labs    04/01/20 0624  NA 138  K 4.4  CL 104  CO2 23  GLUCOSE 125*  BUN 41*  CREATININE 1.64*  CALCIUM 9.3    Physical Exam: BP 120/63 (BP Location: Right Arm)   Pulse 74   Temp 97.8 F (36.6 C) (Oral)   Resp 18   Ht 5\' 8"  (1.727 m)   Wt 58.1 kg   SpO2 100%   BMI 19.48 kg/m   Constitutional: No distress . Vital signs reviewed. HEENT: EOMI, oral membranes moist, some tearing of left eye, no redness Neck: supple Cardiovascular: RRR without murmur. No JVD    Respiratory/Chest: CTA Bilaterally without wheezes or rales. Normal effort    GI/Abdomen: BS +, non-tender, non-distended Ext: no clubbing, cyanosis, or edema Psych: pleasant and cooperative Musc: no tenderness, normal ROM except for right shoulder Neurological: Alert [facility] Makes Eye contact with examiner and follows commands.   Motor: 4+-5/5 throughout, except for right shoulder, limited due to RTC injury, stable   Assessment/Plan: 1. Functional deficits secondary to right thalamic infarct which require 3+ hours per day of interdisciplinary therapy in a comprehensive inpatient rehab setting.  Physiatrist is providing close team supervision and 24 hour management of active medical problems listed below.  Physiatrist and rehab team continue to assess barriers to discharge/monitor patient progress toward functional and  medical goals  Care Tool:  Bathing    Body parts bathed by patient: Right arm, Left arm, Chest, Abdomen, Front perineal area, Buttocks, Right upper leg, Left upper leg, Right lower leg, Left lower leg, Face         Bathing assist Assist Level: Supervision/Verbal cueing     Upper Body Dressing/Undressing Upper body dressing   What is the patient wearing?: Pull over shirt, Button up shirt    Upper body assist Assist Level: Supervision/Verbal cueing    Lower Body Dressing/Undressing Lower body dressing      What is the patient wearing?: Underwear/pull up, Pants     Lower body assist Assist for lower body dressing: Contact Guard/Touching assist     Toileting Toileting    Toileting assist Assist for toileting: Contact Guard/Touching assist Assistive Device Comment: urinal   Transfers Chair/bed transfer  Transfers assist     Chair/bed transfer assist level: Contact Guard/Touching assist     Locomotion Ambulation   Ambulation assist      Assist level: Supervision/Verbal cueing Assistive device: Walker-rolling Max distance: 20'   Walk 10 feet activity   Assist     Assist level: Supervision/Verbal cueing Assistive device: Walker-rolling   Walk 50 feet activity   Assist    Assist level: Supervision/Verbal cueing Assistive device: Walker-rolling    Walk 150 feet activity   Assist Walk 150 feet activity did not occur: Safety/medical concerns  Assist level: Supervision/Verbal cueing Assistive device: Walker-rolling    Walk 10 feet on uneven surface  activity   Assist  Assist level: Minimal Assistance - Patient > 75% Assistive device: Other (comment), Hand held assist(HHA and handrail)   Wheelchair     Assist Will patient use wheelchair at discharge?: No(anticipate will be ambulatory)             Wheelchair 50 feet with 2 turns activity    Assist            Wheelchair 150 feet activity     Assist             Medical Problem List and Plan: 1.  Left side weakness secondary to right thalamic infarction on 03/22/2020  Continue CIR 2.  Antithrombotics: -DVT/anticoagulation: Subcutaneous heparin             -antiplatelet therapy: Aspirin 325 mg daily 3. Pain Management: Tylenol as needed 4. Mood: Provide emotional support             -antipsychotic agents: N/A 5. Neuropsych: This patient is capable of making decisions on his own behalf. 6. Skin/Wound Care: Routine skin checks 7. Fluids/Electrolytes/Nutrition: Routine in and outs.  8.  Hypertension.  Patient on Altace 2.5 mg daily prior to admission.  Resume as needed  Controlled on 4/3  Monitor with increased mobility 9.  Diabetes mellitus with hyperglycemia.  Hemoglobin A1c 7.1.  Currently with SSI.  Patient on Glucophage 500 mg daily prior to admission.    Continue SSI and observe for improvement  Started on amaryl 1mg  daily given likely baseline kidney disease  4/3 well controlled 10.  Hyperlipidemia.  Continue Lipitor 11.  BPH.  Flomax 0.4 mg daily.    PVRs low. 12. Recent history of tobacco abuse.  Counseling 13.  Acute on chronic KD.   Creatinine 1.64 on 4/2  Continue to push fluids.   Continue to monitor 14.?  Allergy in eye  Does not appear to have any physical abnormality  Cont drops  Add claritin daily 15. Slow transit constipation  Colace started 4/1  Miralax started on 4/2  No BM since 3/31---sorbitol this afternoon if no bm  LOS: 8 days A FACE TO FACE EVALUATION WAS PERFORMED  Meredith Staggers 04/02/2020, 12:01 PM

## 2020-04-03 ENCOUNTER — Inpatient Hospital Stay (HOSPITAL_COMMUNITY): Payer: Medicare Other

## 2020-04-03 LAB — GLUCOSE, CAPILLARY
Glucose-Capillary: 106 mg/dL — ABNORMAL HIGH (ref 70–99)
Glucose-Capillary: 84 mg/dL (ref 70–99)
Glucose-Capillary: 113 mg/dL — ABNORMAL HIGH (ref 70–99)
Glucose-Capillary: 164 mg/dL — ABNORMAL HIGH (ref 70–99)

## 2020-04-03 NOTE — Progress Notes (Signed)
Physical Therapy Session Note  Patient Details  Name: Martin Frazier. MRN: [id number] Date of Birth: 1934/03/04  Today's Date: 04/03/2020 PT Individual Time: 6283-1517 and  1420-1515 PT Individual Time Calculation (min): 55 min and 55 min     Short Term Goals: Week 2:  PT Short Term Goal 1 (Week 2): = to LTGs based on ELOS  Skilled Therapeutic Interventions/Progress Updates:    Session 1: Pt supine in bed upon PT arrival, agreeable to therapy tx and denies pain. Pt transferred to sitting with supervision and donned pants, sit<>stand CGA to pull pants over hips. Pt ambulated from bed>bathroom x 10 ft with min assist, no AD. Pt maintained standing balance with CGA while performing clothing management and using the bathroom, continent of bladder. Pt  ambulated x 5 ft to the sink, standing with CGA while washing hands. Pt transported to the gym in w/c. Pt transferred to nustep with CGA, used nustep on workload 5 for global strength and endurance x 6 minutes. Pt transported to gym. PT ambulated 2 x 160 ft this session without AD and with min assist, while ambulating worked on changes in speed and head turns, x1 LOB to the left during this. Pt performed 2 x 10 sit<>stands from the mat without UE support working on LE strengthening, cues not to use back of legs against mat. Pt worked on dynamic standing balance without UE support to perform the following tasks - forwards/backwards walking 2 x 30 ft, sidestepping 4 x 20 ft and standing on airex, CGA-min assist for all tasks. Ambulated to w/c and transported back to room. Pt donned jacket. Pt left in w/c with needs in reach and chair alarm set.   Session 2: Pt supine in bed upon PT arrival, agreeable to therapy tx and denies pain. Pt transferred to sitting with supervision. Pt ambulated from bed>bathroom x 10 ft with min assist, no AD. Pt maintained standing balance with CGA while performing clothing management and using the bathroom, continent of bladder. Pt   ambulated x 5 ft to the sink, standing with CGA while washing hands. Pt transported outside this session in order to work on balance and ambulation on unlevel surfaces. While outside pt ambulated 2 x 80 ft without AD while on unlevel surfaces (grass, inclines/declines) with min assist, cues for increased step length. Pt ambulated x 200 ft this session with RW and CGA working on endurance, cues for step length and upright posture. Pt worked on dynamic balance to perform forwards/backwards ambulation and sidestepping while outside. Pt transported back to the gym. Worked on dynamic standing balance without UE support to perform toe taps on aerobic step x 2 trials and lateral toe taps on aerobic step x 1 trial per side. Pt ambulated back to room at end of session x 200 ft without AD, min assist with cues for increased step length. Pt left in w/c at end of session, needs in reach and chair alarm set.     Therapy Documentation Precautions:  Precautions Precautions: Fall, Other (comment) Precaution Comments: L hemiparesis/impaired coordination Restrictions Weight Bearing Restrictions: No    Therapy/Group: Individual Therapy  Cresenciano Genre, PT, DPT, CSRS 04/03/2020, 8:03 AM

## 2020-04-03 NOTE — Progress Notes (Signed)
[facility] PHYSICAL MEDICINE & REHABILITATION PROGRESS NOTE  Subjective/Complaints: No new issues today. Left eye a little better. Just finishing up breakfast  ROS: Patient denies fever, rash, sore throat, blurred vision, nausea, vomiting, diarrhea, cough, shortness of breath or chest pain, joint or back pain, headache, or mood change.   Objective: Vital Signs: Blood pressure (!) 119/58, pulse 62, temperature 98.2 F (36.8 C), temperature source Oral, resp. rate 18, height 5\' 8"  (1.727 m), weight 58.1 kg, SpO2 (!) 65 %. No results found. No results for input(s): WBC, HGB, HCT, PLT in the last 72 hours. Recent Labs    04/01/20 0624  NA 138  K 4.4  CL 104  CO2 23  GLUCOSE 125*  BUN 41*  CREATININE 1.64*  CALCIUM 9.3    Physical Exam: BP (!) 119/58 (BP Location: Right Arm)   Pulse 62   Temp 98.2 F (36.8 C) (Oral)   Resp 18   Ht 5\' 8"  (1.727 m)   Wt 58.1 kg   SpO2 (!) 65%   BMI 19.48 kg/m   Constitutional: No distress . Vital signs reviewed. HEENT: EOMI, oral membranes moist Neck: supple Cardiovascular: RRR without murmur. No JVD    Respiratory/Chest: CTA Bilaterally without wheezes or rales. Normal effort    GI/Abdomen: BS +, non-tender, non-distended Ext: no clubbing, cyanosis, or edema Psych: pleasant and cooperative Musc: no tenderness, normal ROM except for right shoulder Neurological: Alert [facility] Makes Eye contact with examiner and follows commands.   Motor: 4+-5/5 throughout, except for right shoulder, limited due to RTC injury, stable   Assessment/Plan: 1. Functional deficits secondary to right thalamic infarct which require 3+ hours per day of interdisciplinary therapy in a comprehensive inpatient rehab setting.  Physiatrist is providing close team supervision and 24 hour management of active medical problems listed below.  Physiatrist and rehab team continue to assess barriers to discharge/monitor patient progress toward functional and medical  goals  Care Tool:  Bathing    Body parts bathed by patient: Right arm, Left arm, Chest, Abdomen, Front perineal area, Buttocks, Right upper leg, Left upper leg, Right lower leg, Left lower leg, Face         Bathing assist Assist Level: Supervision/Verbal cueing     Upper Body Dressing/Undressing Upper body dressing   What is the patient wearing?: Pull over shirt, Button up shirt    Upper body assist Assist Level: Supervision/Verbal cueing    Lower Body Dressing/Undressing Lower body dressing      What is the patient wearing?: Underwear/pull up, Pants     Lower body assist Assist for lower body dressing: Contact Guard/Touching assist     Toileting Toileting    Toileting assist Assist for toileting: Contact Guard/Touching assist Assistive Device Comment: urinal   Transfers Chair/bed transfer  Transfers assist     Chair/bed transfer assist level: Contact Guard/Touching assist     Locomotion Ambulation   Ambulation assist      Assist level: Minimal Assistance - Patient > 75% Assistive device: No Device Max distance: 160 ft   Walk 10 feet activity   Assist     Assist level: Minimal Assistance - Patient > 75% Assistive device: No Device   Walk 50 feet activity   Assist    Assist level: Minimal Assistance - Patient > 75% Assistive device: No Device    Walk 150 feet activity   Assist Walk 150 feet activity did not occur: Safety/medical concerns  Assist level: Minimal Assistance - Patient > 75% Assistive  device: No Device    Walk 10 feet on uneven surface  activity   Assist     Assist level: Minimal Assistance - Patient > 75% Assistive device: Other (comment), Hand held assist(HHA and handrail)   Wheelchair     Assist Will patient use wheelchair at discharge?: No(anticipate will be ambulatory)             Wheelchair 50 feet with 2 turns activity    Assist            Wheelchair 150 feet activity      Assist            Medical Problem List and Plan: 1.  Left side weakness secondary to right thalamic infarction on 03/22/2020  Continue CIR 2.  Antithrombotics: -DVT/anticoagulation: Subcutaneous heparin             -antiplatelet therapy: Aspirin 325 mg daily 3. Pain Management: Tylenol as needed 4. Mood: Provide emotional support             -antipsychotic agents: N/A 5. Neuropsych: This patient is capable of making decisions on his own behalf. 6. Skin/Wound Care: Routine skin checks 7. Fluids/Electrolytes/Nutrition: Routine in and outs.  8.  Hypertension.  Patient on Altace 2.5 mg daily prior to admission.  Resume as needed  Controlled on 4/4  Monitor with increased mobility 9.  Diabetes mellitus with hyperglycemia.  Hemoglobin A1c 7.1.  Currently with SSI.  Patient on Glucophage 500 mg daily prior to admission.    Continue SSI and observe for improvement  Started on amaryl 1mg  daily given likely baseline kidney disease  4/4 well controlled 10.  Hyperlipidemia.  Continue Lipitor 11.  BPH.  Flomax 0.4 mg daily.    PVRs low. 12. Recent history of tobacco abuse.  Counseling 13.  Acute on chronic KD.   Creatinine 1.64 on 4/2  Continue to push fluids.   Continue to monitor 14.?  Allergy in eye  Does not appear to have any physical abnormality  Cont drops  Added claritin daily 4/3 15. Slow transit constipation  Colace started 4/1  Miralax started on 4/2  No BM since 3/31---sorbitol  Moved bowels 3x 4/3  LOS: 9 days A FACE TO FACE EVALUATION WAS PERFORMED  Meredith Staggers 04/03/2020, 10:18 AM

## 2020-04-04 ENCOUNTER — Inpatient Hospital Stay (HOSPITAL_COMMUNITY): Payer: Medicare Other

## 2020-04-04 ENCOUNTER — Ambulatory Visit (HOSPITAL_COMMUNITY): Payer: Medicare Other

## 2020-04-04 ENCOUNTER — Encounter (HOSPITAL_COMMUNITY): Payer: Medicare Other | Admitting: Occupational Therapy

## 2020-04-04 LAB — GLUCOSE, CAPILLARY
Glucose-Capillary: 100 mg/dL — ABNORMAL HIGH (ref 70–99)
Glucose-Capillary: 68 mg/dL — ABNORMAL LOW (ref 70–99)
Glucose-Capillary: 112 mg/dL — ABNORMAL HIGH (ref 70–99)
Glucose-Capillary: 190 mg/dL — ABNORMAL HIGH (ref 70–99)
Glucose-Capillary: 134 mg/dL — ABNORMAL HIGH (ref 70–99)

## 2020-04-04 MED ORDER — GLUCOSE 40 % PO GEL
ORAL | Status: AC
  Administered 2020-04-04: 12:00:00 37.5 g
  Filled 2020-04-04: qty 1

## 2020-04-04 NOTE — Progress Notes (Addendum)
Physical Therapy Session Note  Patient Details  Name: Martin Frazier. MRN: [id number] Date of Birth: 1934/07/29  Today's Date: 04/04/2020 PT Individual Time: 1102-1203, 1420-1530 PT Individual Time Calculation (min): 61 min and 70 min  Short Term Goals: Week 2:  PT Short Term Goal 1 (Week 2): = to LTGs based on ELOS  Skilled Therapeutic Interventions/Progress Updates:     Seesion 1: Pt received seated on high low mat in therapy gym with wife and son present. Report of chronic pain in low back. PT provides education on posture and there-ex to address low back pain.  Pt and family verbalize understanding.  PT educates family on correct sizing of RW, positioning to assist pt during ambulation to ensure safe guarding, optimal body mechanics and sequencing for pt to perform sit to stand with RW.   Pt performs repeated sit to stand transfers with BUEs folded across chest and PT providing CGA/minA at trunk to encourage anterior weight shifting. Pt tends to keep weight in heels and toes are visibly off ground during most transfer attempts. Extra attention on eccentric control and keeping COM over BOS when performing stand>sit.   Pt ambulates 200' with CGA and RW and PT demonstrating body positioning for family members and verbal cues to correct pt's gait deviations. CGA for car transfer and ambulation up/down 10' ramp. Pt works on stepping strategy with posterior "push and release" test. Pt able to prevent LOB ~75% of time with supervision/CGA from PT.   Pt ambulates 200' with RW back to room. Left seated in WC with all needs within reach and alarm intact. Family present.   Session 2: Pt received seated in WC. Agreeable to therapy. No report of pain. Pt taken outside for training on ambulating on unlevel surfaces and navigating obstacles. Pt ambulates x150' with RW and CGA. x150' without AD and requiring minA for x1 LOB laterally while turning to there R. Pt then performs forward/backward ambulation  x20', working on increased stride length and weight shifting to improve balance. Pt has consistently shortened stance time on the L but is able to improve with cuing.   PT provides WC transport inside and pt performs dynamic balance training with Wii bowling. Pt does not have any LOBs during 10 frame game but does require x1 extended seated rest break.   WC transport back to room. Performs x10' x2 to/from toilet with CGA and no AD. Left seated in WC with alarm intact and all needs within reach.  Therapy Documentation Precautions:  Precautions Precautions: Fall, Other (comment) Precaution Comments: L hemiparesis/impaired coordination Restrictions Weight Bearing Restrictions: No   Therapy/Group: Individual Therapy  Beau Fanny, PT, DPT 04/04/2020, 12:44 PM

## 2020-04-04 NOTE — Progress Notes (Signed)
Social Work Patient ID: Martin Frazier., male   DOB: 30-May-1934, 86 y.o.   MRN: [id number]  SW contacted spouse to inform her of therapy's request for shower chair. Sw informed spouse that shower chairs are not covered by insurance. Spouse instructed SW to order chair still and she wold decide once DME company reached out to her. If spouse decides to decline SW will offer other options for shower chair.

## 2020-04-04 NOTE — Discharge Instructions (Signed)
Inpatient Rehab Discharge Instructions  Martin Frazier. Discharge date and time: No discharge date for patient encounter.   Activities/Precautions/ Functional Status: Activity: activity as tolerated Diet: diabetic diet Wound Care: none needed Functional status:  ___ No restrictions     ___ Walk up steps independently ___ 24/7 supervision/assistance   ___ Walk up steps with assistance ___ Intermittent supervision/assistance  ___ Bathe/dress independently ___ Walk with walker     _x__ Bathe/dress with assistance ___ Walk Independently    ___ Shower independently ___ Walk with assistance    ___ Shower with assistance ___ No alcohol     ___ Return to work/school ________ COMMUNITY REFERRALS UPON DISCHARGE:    Home Health:   PT     OT                    Agency: [facility] [facility]: [phone]    Medical Equipment/Items Ordered: Paediatric nurse                                                 Agency/Supplier: [employer] [phone]   Special Instructions:  No driving smoking or alcohol STROKE/TIA DISCHARGE INSTRUCTIONS SMOKING Cigarette smoking nearly doubles your risk of having a stroke & is the single most alterable risk factor  If you smoke or have smoked in the last 12 months, you are advised to quit smoking for your health.  Most of the excess cardiovascular risk related to smoking disappears within a year of stopping.  Ask you doctor about anti-smoking medications  [place] Quit Line: 1-800-QUIT NOW  Free Smoking Cessation Classes [phone]  CHOLESTEROL Know your levels; limit fat & cholesterol in your diet  Lipid Panel     Component Value Date/Time   CHOL 154 03/23/2020 0532   TRIG 104 03/23/2020 0532   HDL 46 03/23/2020 0532   CHOLHDL 3.3 03/23/2020 0532   VLDL 21 03/23/2020 0532   LDLCALC 87 03/23/2020 0532      Many patients benefit from treatment even if their cholesterol is at goal.  Goal: Total Cholesterol (CHOL) less than 160  Goal:  Triglycerides  (TRIG) less than 150  Goal:  HDL greater than 40  Goal:  LDL (LDLCALC) less than 100   BLOOD PRESSURE American Stroke Association blood pressure target is less that 120/80 mm/Hg  Your discharge blood pressure is:  BP: 122/71  Monitor your blood pressure  Limit your salt and alcohol intake  Many individuals will require more than one medication for high blood pressure  DIABETES (A1c is a blood sugar average for last 3 months) Goal HGBA1c is under 7% (HBGA1c is blood sugar average for last 3 months)  Diabetes:    Lab Results  Component Value Date   HGBA1C 7.1 (H) 03/23/2020     Your HGBA1c can be lowered with medications, healthy diet, and exercise.  Check your blood sugar as directed by your physician  Call your physician if you experience unexplained or low blood sugars.  PHYSICAL ACTIVITY/REHABILITATION Goal is 30 minutes at least 4 days per week  Activity: Increase activity slowly, Therapies: Physical Therapy: Home Health Return to work:   Activity decreases your risk of heart attack and stroke and makes your heart stronger.  It helps control your weight and blood pressure; helps you relax and can improve your mood.  Participate  in a regular exercise program.  Talk with your doctor about the best form of exercise for you (dancing, walking, swimming, cycling).  DIET/WEIGHT Goal is to maintain a healthy weight  Your discharge diet is:  Diet Order            Diet heart healthy/carb modified Room service appropriate? Yes; Fluid consistency: Thin  Diet effective now              liquids Your height is:  Height: 5\' 8"  (172.7 cm) Your current weight is: Weight: 60.2 kg Your Body Mass Index (BMI) is:  BMI (Calculated): 20.18  Following the type of diet specifically designed for you will help prevent another stroke.  Your goal weight range is:    Your goal Body Mass Index (BMI) is 19-24.  Healthy food habits can help reduce 3 risk factors for stroke:  High cholesterol,  hypertension, and excess weight.  RESOURCES Stroke/Support Group:  Call [phone]   STROKE EDUCATION PROVIDED/REVIEWED AND GIVEN TO PATIENT Stroke warning signs and symptoms How to activate emergency medical system (call 911). Medications prescribed at discharge. Need for follow-up after discharge. Personal risk factors for stroke. Pneumonia vaccine given:  Flu vaccine given:  My questions have been answered, the writing is legible, and I understand these instructions.  I will adhere to these goals & educational materials that have been provided to me after my discharge from the hospital.      My questions have been answered and I understand these instructions. I will adhere to these goals and the provided educational materials after my discharge from the hospital.  Patient/Caregiver Signature _______________________________ Date __________  Clinician Signature _______________________________________ Date __________  Please bring this form and your medication list with you to all your follow-up doctor's appointments.

## 2020-04-04 NOTE — Progress Notes (Signed)
Hypoglycemic Event  CBG: 68  Treatment: 1 tube glucose gel  Symptoms: None  Follow-up CBG: Time:1242 CBG Result:112  Possible Reasons for Event: Inadequate meal intake  Comments/MD notified:Yes, Dan A., P.A.    Mamie Nick

## 2020-04-04 NOTE — Progress Notes (Signed)
Nutrition Follow-up  DOCUMENTATION CODES:   Not applicable  INTERVENTION:  D/c CIB, pt not consuming  D/c Ensure Enlive BID, pt not consuming  Snacks BID   NUTRITION DIAGNOSIS:   Increased nutrient needs related to acute illness(CIR admit s/p left sided weakness due to right thalamic infarction) as evidenced by estimated needs.  Ongoing.   GOAL:   Patient will meet greater than or equal to 90% of their needs  Progressing  MONITOR:   Labs, I & O's, Supplement acceptance, PO intake, Weight trends  REASON FOR ASSESSMENT:   Malnutrition Screening Tool    ASSESSMENT:   86 year old male with history of DM2, HTN, CKD stage III, BPH, and HOH presented to [facility] on 3/23 with left hemiparesis and gait instability. 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  RN in room at time of visit.   Pt's wife and son in room at time of visit and report pt had good appetite at home. Pt reports he has not been wanting supplements while admitted and states "I get all I need." Pt's family is agreeable to some snacks being provided for the patient; family is also bringing snacks from home.   Noted anticipated discharge on 04/06/20.   PO Intake: 50-100% x last 8 recorded meals (84% average intake)  UOP: x24 hours I/O: +1,980ml since admit  Medications reviewed and include: Colace, Ensure Enlive BID, Novolog, Miralax  Labs reviewed: CBGs 68-164    Diet Order:   Diet Order            Diet heart healthy/carb modified Room service appropriate? Yes; Fluid consistency: Thin  Diet effective now              EDUCATION NEEDS:   Not appropriate for education at this time  Skin:  Skin Assessment: Reviewed RN Assessment  Last BM:  04/02/20  Height:   Ht Readings from  Last 1 Encounters:  03/25/20 5\' 8"  (1.727 m)    Weight:   Wt Readings from Last 1 Encounters:  04/01/20 58.1 kg    BMI:  Body mass index is 19.48 kg/m.  Estimated Nutritional Needs:   Kcal:  1700-1900  Protein:  85-95  Fluid:  >/= 1.5 L/day   06/01/20, MS, RD, LDN RD pager number and weekend/on-call pager number located in Amion.

## 2020-04-04 NOTE — Progress Notes (Signed)
Occupational Therapy Session Note  Patient Details  Name: Martin Frazier. MRN: [id number] Date of Birth: 1934-03-01  Today's Date: 04/04/2020 OT Individual Time: 1000-1100 OT Individual Time Calculation (min): 60 min    Short Term Goals: Week 1:  OT Short Term Goal 1 (Week 1): Pt will complete toilet transfers with min assist for 3 consecutive sessions using the RW for support. OT Short Term Goal 1 - Progress (Week 1): Met OT Short Term Goal 2 (Week 1): Pt will complete walk-in shower transfers with min assist for with use of the RW for support for 2 consecutive sessions. OT Short Term Goal 2 - Progress (Week 1): Met OT Short Term Goal 3 (Week 1): Pt will complete UB dressing with supervision for a pullover or button up shirt with AE PRN. OT Short Term Goal 3 - Progress (Week 1): Met OT Short Term Goal 4 (Week 1): Pt will complete all LB dressing with min guard assist sit to stand for 3 consecutive sessions. OT Short Term Goal 4 - Progress (Week 1): Met  Week 2: STGs = LTGs  Skilled Therapeutic Interventions/Progress Updates:    Pt seen for family education with his wife and son.  Pt was able to demonstrate to them how he is able to ambulate in the room with his RW, toilet, shower and dress all with S. (except for A with TED hose).   Pt ambulated to ADL apartment with close S and CGA 1x as pt had a small LOB to his R.  Emphasized to family what CLOSE supervision looks like (hands right by pt) and they were able to return demonstrate.  In apartment, pt sat and stood from low couch. Demonstrated use of shower chair and family measured chair and decided to order it.  SW aware.   Pt then ambulated to gym and hand off to PT.  Therapy Documentation Precautions:  Precautions Precautions: Fall, Other (comment) Precaution Comments: L hemiparesis/impaired coordination Restrictions Weight Bearing Restrictions: No   Pain: Pain Assessment Pain Score: 0-No pain    Therapy/Group: Individual  Therapy  Carli Lefevers 04/04/2020, 12:43 PM

## 2020-04-04 NOTE — Progress Notes (Signed)
[facility] PHYSICAL MEDICINE & REHABILITATION PROGRESS NOTE  Subjective/Complaints:  Family training in progress Left eye still tearing, no pain, occ itching.  ROS: Patient denies CP, SOB, N/V/DObjective: Vital Signs: Blood pressure 128/65, pulse 76, temperature 98.2 F (36.8 C), temperature source Oral, resp. rate 18, height 5\' 8"  (1.727 m), weight 58.1 kg, SpO2 97 %. No results found. No results for input(s): WBC, HGB, HCT, PLT in the last 72 hours. No results for input(s): NA, K, CL, CO2, GLUCOSE, BUN, CREATININE, CALCIUM in the last 72 hours.  Physical Exam: BP 128/65 (BP Location: Left Arm)   Pulse 76   Temp 98.2 F (36.8 C) (Oral)   Resp 18   Ht 5\' 8"  (1.727 m)   Wt 58.1 kg   SpO2 97%   BMI 19.48 kg/m    General: No acute distress Eyes- no injection , no blepharitis, no tearing, no discharge  Mood and affect are appropriate Heart: Regular rate and rhythm no rubs murmurs or extra sounds Lungs: Clear to auscultation, breathing unlabored, no rales or wheezes Abdomen: Positive bowel sounds, soft nontender to palpation, nondistended Extremities: No clubbing, cyanosis, or edema Skin: No evidence of breakdown, no evidence of rash  [facility] Makes Eye contact with examiner and follows commands.   Motor: 4+-5/5 throughout, except for right shoulder, limited due to RTC injury, stable   Assessment/Plan: 1. Functional deficits secondary to right thalamic infarct which require 3+ hours per day of interdisciplinary therapy in a comprehensive inpatient rehab setting.  Physiatrist is providing close team supervision and 24 hour management of active medical problems listed below.  Physiatrist and rehab team continue to assess barriers to discharge/monitor patient progress toward functional and medical goals  Care Tool:  Bathing    Body parts bathed by patient: Right arm, Left arm, Chest, Abdomen, Front perineal area, Buttocks, Right upper leg, Left upper leg, Right lower leg, Left  lower leg, Face         Bathing assist Assist Level: Supervision/Verbal cueing     Upper Body Dressing/Undressing Upper body dressing   What is the patient wearing?: Pull over shirt, Button up shirt    Upper body assist Assist Level: Supervision/Verbal cueing    Lower Body Dressing/Undressing Lower body dressing      What is the patient wearing?: Underwear/pull up, Pants     Lower body assist Assist for lower body dressing: Contact Guard/Touching assist     Toileting Toileting    Toileting assist Assist for toileting: Contact Guard/Touching assist Assistive Device Comment: urinal   Transfers Chair/bed transfer  Transfers assist     Chair/bed transfer assist level: Contact Guard/Touching assist     Locomotion Ambulation   Ambulation assist      Assist level: Minimal Assistance - Patient > 75% Assistive device: No Device Max distance: 160 ft   Walk 10 feet activity   Assist     Assist level: Minimal Assistance - Patient > 75% Assistive device: No Device   Walk 50 feet activity   Assist    Assist level: Minimal Assistance - Patient > 75% Assistive device: No Device    Walk 150 feet activity   Assist Walk 150 feet activity did not occur: Safety/medical concerns  Assist level: Minimal Assistance - Patient > 75% Assistive device: No Device    Walk 10 feet on uneven surface  activity   Assist     Assist level: Minimal Assistance - Patient > 75% Assistive device: Other (comment), Hand held assist(HHA and handrail)  Wheelchair     Assist Will patient use wheelchair at discharge?: No(anticipate will be ambulatory)             Wheelchair 50 feet with 2 turns activity    Assist            Wheelchair 150 feet activity     Assist            Medical Problem List and Plan: 1.  Left side weakness secondary to right thalamic infarction on 03/22/2020  Continue CIR tent d/c 4/7  2.   Antithrombotics: -DVT/anticoagulation: Subcutaneous heparin             -antiplatelet therapy: Aspirin 325 mg daily 3. Pain Management: Tylenol as needed 4. Mood: Provide emotional support             -antipsychotic agents: N/A 5. Neuropsych: This patient is capable of making decisions on his own behalf. 6. Skin/Wound Care: Routine skin checks 7. Fluids/Electrolytes/Nutrition: Routine in and outs.  8.  Hypertension.  Patient on Altace 2.5 mg daily prior to admission.  Resume as needed Vitals:   04/03/20 2000 04/04/20 0549  BP: (!) 117/58 128/65  Pulse: 76 76  Resp: 17 18  Temp: 98.9 F (37.2 C) 98.2 F (36.8 C)  SpO2: 94% 97%    Controlled on 4/5  Monitor with increased mobility 9.  Diabetes mellitus with hyperglycemia.  Hemoglobin A1c 7.1.  Currently with SSI.  Patient on Glucophage 500 mg daily prior to admission.    Continue SSI and observe for improvement  Started on amaryl 1mg  daily given likely baseline kidney disease  4/4 well controlled 10.  Hyperlipidemia.  Continue Lipitor 11.  BPH.  Flomax 0.4 mg daily.    PVRs low. 12. Recent history of tobacco abuse.  Counseling 13.  Acute on chronic KD.   Creatinine 1.64 on 4/2  Continue to push fluids.   Continue to monitor 14.?  Allergy in eye  Does not appear to have any physical abnormality  Cont drops  Added claritin daily 4/3 15. Slow transit constipation  Colace started 4/1  Miralax started on 4/2  No BM since 3/31---sorbitol  Moved bowels 3x 4/3  LOS: 10 days A FACE TO FACE EVALUATION WAS PERFORMED  6/2 04/04/2020, 9:52 AM

## 2020-04-04 NOTE — Discharge Summary (Signed)
Physician Discharge Summary  Patient ID: Martin Frazier. MRN: [id number] DOB/AGE: 06/17/1934 86 y.o.  Admit date: 03/25/2020 Discharge date: 04/06/2020  Discharge Diagnoses:  Principal Problem:   Right thalamic infarction [facility]) Active Problems:   AKI (acute kidney injury) ([place])   Controlled type 2 diabetes mellitus with hyperglycemia, without long-term current use of insulin (HCC)   History of hypertension   Soreness of eye   Slow transit constipation DVT prophylaxis BPH Tobacco abuse  Discharged Condition: Stable  Significant Diagnostic Studies: CT HEAD WO CONTRAST  Result Date: 03/22/2020 CLINICAL DATA:  Confusion EXAM: CT HEAD WITHOUT CONTRAST TECHNIQUE: Contiguous axial images were obtained from the base of the skull through the vertex without intravenous contrast. COMPARISON:  None. FINDINGS: Brain: No evidence of acute infarction, hemorrhage, hydrocephalus, extra-axial collection or mass lesion/mass effect. Focal encephalomalacia from lacunar infarct in the right thalamus. Additional small lacunar infarcts in the left corona radiata. Mild-moderate low-density changes within the periventricular and subcortical white matter compatible with chronic microvascular ischemic change. Mild diffuse cerebral volume loss. Vascular: Atherosclerotic calcifications involving the large vessels of the skull base. No unexpected hyperdense vessel. Skull: Normal. Negative for fracture or focal lesion. Sinuses/Orbits: No acute finding. Other: None. IMPRESSION: 1. No acute intracranial findings. 2. Chronic microvascular ischemic changes and cerebral volume loss. 3. Focal encephalomalacia from remote lacunar infarct in the right thalamus. Additional small remote lacunar infarcts in the left corona radiata. Electronically Signed   By: Davina Poke D.O.   On: 03/22/2020 11:07   MR ANGIO HEAD WO CONTRAST  Result Date: 03/22/2020 CLINICAL DATA:  Confusion EXAM: MRI HEAD WITHOUT CONTRAST MRA HEAD WITHOUT  CONTRAST TECHNIQUE: Multiplanar, multiecho pulse sequences of the brain and surrounding structures were obtained without intravenous contrast. Angiographic images of the head were obtained using MRA technique without contrast. COMPARISON:  None. FINDINGS: MRI HEAD FINDINGS Brain: There is a 1.2 cm area of restricted diffusion within the lateral right thalamus. No evidence of intracranial hemorrhage. Patchy and confluent areas of T2 hyperintensity in the supratentorial white matter nonspecific but probably reflect mild to moderate chronic microvascular ischemic changes. There are chronic small vessel infarcts of the left frontal white matter, left caudate, and left cerebellum. Prominence of the ventricles and sulci reflects mild generalized parenchymal volume loss. There is no intracranial mass, mass effect, or edema. There is no hydrocephalus or extra-axial fluid collection. Vascular: Major vessel flow voids at the skull base are preserved. Skull and upper cervical spine: Normal marrow signal is preserved. Sinuses/Orbits: Minor mucosal thickening.  Orbits are unremarkable. Other: Sella is unremarkable.  Mastoid air cells are clear. MRA HEAD FINDINGS Intracranial internal carotid arteries are patent. There is moderate to marked stenosis of the right paraclinoid ICA. Middle and anterior cerebral arteries are patent. 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 Intracranial vertebral arteries, basilar artery, posterior cerebral arteries are patent. Right posterior communicating artery is present with fetal origin of the right PCA. There is no significant stenosis or aneurysm. IMPRESSION: Acute right thalamic infarct. Chronic microvascular ischemic changes and chronic small vessel infarcts as described. 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 Electronically Signed   By: Macy Mis M.D.   On: 03/22/2020 13:02   MR BRAIN WO CONTRAST  Result Date: 03/22/2020 CLINICAL DATA:  Confusion  EXAM: MRI HEAD WITHOUT CONTRAST MRA HEAD WITHOUT CONTRAST TECHNIQUE: Multiplanar, multiecho pulse sequences of the brain and surrounding structures were obtained without intravenous contrast. Angiographic images of the head were obtained using  MRA technique without contrast. COMPARISON:  None. FINDINGS: MRI HEAD FINDINGS Brain: There is a 1.2 cm area of restricted diffusion within the lateral right thalamus. No evidence of intracranial hemorrhage. Patchy and confluent areas of T2 hyperintensity in the supratentorial white matter nonspecific but probably reflect mild to moderate chronic microvascular ischemic changes. There are chronic small vessel infarcts of the left frontal white matter, left caudate, and left cerebellum. Prominence of the ventricles and sulci reflects mild generalized parenchymal volume loss. There is no intracranial mass, mass effect, or edema. There is no hydrocephalus or extra-axial fluid collection. Vascular: Major vessel flow voids at the skull base are preserved. Skull and upper cervical spine: Normal marrow signal is preserved. Sinuses/Orbits: Minor mucosal thickening.  Orbits are unremarkable. Other: Sella is unremarkable.  Mastoid air cells are clear. MRA HEAD FINDINGS Intracranial internal carotid arteries are patent. There is moderate to marked stenosis of the right paraclinoid ICA. Middle and anterior cerebral arteries are patent. 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 Intracranial vertebral arteries, basilar artery, posterior cerebral arteries are patent. Right posterior communicating artery is present with fetal origin of the right PCA. There is no significant stenosis or aneurysm. IMPRESSION: Acute right thalamic infarct. Chronic microvascular ischemic changes and chronic small vessel infarcts as described. 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 Electronically Signed   By: Guadlupe Spanish M.D.   On: 03/22/2020 13:02   US Carotid Bilateral (at [facility]  and [facility] only)  Result Date: 03/23/2020 CLINICAL DATA:  Altered mental status. Left hand numbness. Acute right thalamic infarct based on MRI. EXAM: BILATERAL CAROTID DUPLEX ULTRASOUND TECHNIQUE: Wallace Cullens scale imaging, color Doppler and duplex ultrasound were performed of bilateral carotid and vertebral arteries in the neck. COMPARISON:  None FINDINGS: Criteria: Quantification of carotid stenosis is based on velocity parameters that correlate the residual internal carotid diameter with NASCET-based stenosis levels, using the diameter of the distal internal carotid lumen as the denominator for stenosis measurement. The following velocity measurements were obtained: RIGHT ICA: 76/16 cm/sec CCA: 76/12 cm/sec SYSTOLIC ICA/CCA RATIO:  1.0 ECA: 84 cm/sec LEFT ICA: 90/24 cm/sec CCA: 96/17 cm/sec SYSTOLIC ICA/CCA RATIO:  0.9 ECA: 111 cm/sec RIGHT CAROTID ARTERY: Small amount of circumferential plaque at the right carotid bulb. External carotid artery is patent with normal waveform. Heterogeneous and echogenic plaque in the proximal internal carotid artery. Normal waveforms and velocities in the internal carotid artery. RIGHT VERTEBRAL ARTERY: Antegrade flow and normal waveform in the right vertebral artery. LEFT CAROTID ARTERY: Small amount of plaque at the left carotid bulb and origin of the internal and external carotid arteries. External carotid artery is patent with normal waveform. Combination of heterogeneous and echogenic plaque in the proximal internal carotid artery. Normal waveforms and velocities in the internal carotid artery. LEFT VERTEBRAL ARTERY: Antegrade flow and normal waveform in the left vertebral artery. IMPRESSION: Atherosclerotic plaque at the carotid bulbs and proximal internal carotid arteries bilaterally. Estimated degree of stenosis in the internal carotid arteries is less than 50% bilaterally. Patent vertebral arteries with antegrade flow. Electronically Signed   By: Richarda Overlie M.D.   On: 03/23/2020  07:54   DG Chest Port 1 View  Result Date: 03/22/2020 CLINICAL DATA:  86 year old male with history of trauma from a fall yesterday complaining of left anterior chest pain. EXAM: PORTABLE CHEST 1 VIEW COMPARISON:  Chest x-ray 01/30/2019. FINDINGS: Lung volumes are normal. No consolidative airspace disease. No pleural effusions. No pneumothorax. No pulmonary nodule or mass noted.  Pulmonary vasculature and the cardiomediastinal silhouette are within normal limits. Atherosclerosis in the thoracic aorta. No definite acute displaced left-sided rib fractures are noted. IMPRESSION: 1. No acute displaced left-sided rib fractures and no radiographic evidence of acute cardiopulmonary disease. 2. Aortic atherosclerosis. Electronically Signed   By: Trudie Reed M.D.   On: 03/22/2020 10:57   ECHOCARDIOGRAM COMPLETE  Result Date: 03/23/2020    ECHOCARDIOGRAM REPORT   Patient Name:   Martin Frazier. Date of Exam: 03/23/2020 Medical Rec #:  [id number]       Height:       68.0 in Accession #:    [id number]      Weight:       130.0 lb Date of Birth:  1934/05/13       BSA:          1.702 m Patient Age:    85 years        BP:           114/63 mmHg Patient Gender: M               HR:           89 bpm. [facility] Procedure: 2D Echo, Cardiac Doppler and Color Doppler Indications:    Stroke 434.91 / I163.9  History:        Patient has no prior history of Echocardiogram examinations.                 Risk Factors:Diabetes and Hypertension. GERD.  Sonographer:    Celesta Gentile RCS Referring Phys: [id number] CARLOS MADERA IMPRESSIONS  1. Left ventricular ejection fraction, by estimation, is 60 to 65%. The left ventricle has normal function. The left ventricle has no regional wall motion abnormalities. Left ventricular diastolic parameters are consistent with Grade I diastolic dysfunction (impaired relaxation).  2. Right ventricular systolic function is normal. The right ventricular size is normal. There is normal pulmonary  artery systolic pressure.  3. The mitral valve is normal in structure. Trivial mitral valve regurgitation. No evidence of mitral stenosis.  4. The aortic valve is tricuspid. Aortic valve regurgitation is mild. No aortic stenosis is present.  5. The inferior vena cava is normal in size with greater than 50% respiratory variability, suggesting right atrial pressure of 3 mmHg. FINDINGS  Left Ventricle: Left ventricular ejection fraction, by estimation, is 60 to 65%. The left ventricle has normal function. The left ventricle has no regional wall motion abnormalities. The left ventricular internal cavity size was normal in size. There is  no left ventricular hypertrophy. Left ventricular diastolic parameters are consistent with Grade I diastolic dysfunction (impaired relaxation). Normal left ventricular filling pressure. Right Ventricle: The right ventricular size is normal. No increase in right ventricular wall thickness. Right ventricular systolic function is normal. There is normal pulmonary artery systolic pressure. The tricuspid regurgitant velocity is 2.37 m/s, and  with an assumed right atrial pressure of 3 mmHg, the estimated right ventricular systolic pressure is 25.5 mmHg. Left Atrium: Left atrial size was normal in size. Right Atrium: Right atrial size was normal in size. Pericardium: There is no evidence of pericardial effusion. Mitral Valve: The mitral valve is normal in structure. Trivial mitral valve regurgitation. No evidence of mitral valve stenosis. Tricuspid Valve: The tricuspid valve is normal in structure. Tricuspid valve regurgitation is mild . No evidence of tricuspid stenosis. Aortic Valve: The aortic valve is tricuspid. Aortic valve regurgitation is mild. No aortic stenosis is present. Aortic valve  mean gradient measures 2.3 mmHg. Aortic valve peak gradient measures 4.5 mmHg. Aortic valve area, by VTI measures 2.79 cm. Pulmonic Valve: The pulmonic valve was not well visualized. Pulmonic valve  regurgitation is not visualized. No evidence of pulmonic stenosis. Aorta: The aortic root is normal in size and structure. Venous: The inferior vena cava is normal in size with greater than 50% respiratory variability, suggesting right atrial pressure of 3 mmHg. IAS/Shunts: No atrial level shunt detected by color flow Doppler.  LEFT VENTRICLE PLAX 2D LVIDd:         3.21 cm     Diastology LVIDs:         2.40 cm     LV e' lateral:   7.94 cm/s LV PW:         0.93 cm     LV E/e' lateral: 5.5 LV IVS:        0.90 cm     LV e' medial:    4.35 cm/s LVOT diam:     2.00 cm     LV E/e' medial:  10.0 LV SV:         51 LV SV Index:   30 LVOT Area:     3.14 cm  LV Volumes (MOD) LV vol d, MOD A2C: 40.1 ml LV vol d, MOD A4C: 51.1 ml LV vol s, MOD A2C: 17.1 ml LV vol s, MOD A4C: 24.3 ml LV SV MOD A2C:     23.0 ml LV SV MOD A4C:     51.1 ml LV SV MOD BP:      23.4 ml RIGHT VENTRICLE RV Mid diam:    2.61 cm RV S prime:     10.00 cm/s TAPSE (M-mode): 1.4 cm LEFT ATRIUM             Index LA diam:        3.80 cm 2.23 cm/m LA Vol (A2C):   41.9 ml 24.62 ml/m LA Vol (A4C):   34.1 ml 20.04 ml/m LA Biplane Vol: 38.6 ml 22.68 ml/m  AORTIC VALVE AV Area (Vmax):    2.64 cm AV Area (Vmean):   2.95 cm AV Area (VTI):     2.79 cm AV Vmax:           106.19 cm/s AV Vmean:          70.969 cm/s AV VTI:            0.182 m AV Peak Grad:      4.5 mmHg AV Mean Grad:      2.3 mmHg LVOT Vmax:         89.40 cm/s LVOT Vmean:        66.700 cm/s LVOT VTI:          0.162 m LVOT/AV VTI ratio: 0.89  AORTA Ao Root diam: 3.00 cm MITRAL VALVE                TRICUSPID VALVE MV Area (PHT): 3.30 cm     TR Peak grad:   22.5 mmHg MV Decel Time: 230 msec     TR Vmax:        237.00 cm/s MV E velocity: 43.70 cm/s MV A velocity: 104.00 cm/s  SHUNTS MV E/A ratio:  0.42         Systemic VTI:  0.16 m                             Systemic  Diam: 2.00 cm Dina Rich MD Electronically signed by Dina Rich MD Signature Date/Time: 03/23/2020/3:25:06 PM    Final      Labs:  Basic Metabolic Panel: Recent Labs  Lab 04/01/20 0624  NA 138  K 4.4  CL 104  CO2 23  GLUCOSE 125*  BUN 41*  CREATININE 1.64*  CALCIUM 9.3    CBC: No results for input(s): WBC, NEUTROABS, HGB, HCT, MCV, PLT in the last 168 hours.  CBG: Recent Labs  Lab 04/04/20 2111 04/05/20 0613 04/05/20 1145 04/05/20 1641 04/05/20 2101  GLUCAP 134* 102* 105* 182* 112*   Family history.  Mother and father with hypertension as well as hyperlipidemia.  Denies any colon cancer rectal cancer or esophageal cancer  Brief HPI:   Martin Frazier. is a 86 y.o. right-handed male with history of diabetes mellitus, hypertension and recently stopped smoking, CKD stage III as well as BPH.  Patient lives with spouse independent prior to admission.  Presented 03/22/2020 to [facility] with left-sided weakness and gait instability.  Admission chemistries BUN 22, creatinine 1.49, urine drug screen negative, urinalysis negative, hemoglobin A1c 7.1.  CT/MRI showed acute right thalamic infarction.  Patient did not receive TPA.  MRA of the head showed chronic microvascular ischemic changes with moderate to marked focal stenosis of the right paraclinoid ICA.  Echocardiogram with ejection fraction of 65% without embolus.  Carotid Dopplers atherosclerotic plaque at the carotid bulbs and proximal internal carotid arteries bilaterally.  Estimated degree of stenosis in the internal carotid arteries less than 50% bilaterally.  Follow-up neurology services Dr. Gerilyn Pilgrim advise aspirin therapy 325 mg daily.  Subcutaneous heparin for DVT prophylaxis.  Tolerating a regular diet.  Therapy evaluations completed and patient was admitted for a comprehensive rehab program   Hospital Course: Martin Frazier. was admitted to rehab 03/25/2020 for inpatient therapies to consist of PT, ST and OT at least three hours five days a week. Past admission physiatrist, therapy team and rehab RN have worked together to provide  customized collaborative inpatient rehab.  Pertaining to patient's right thalamic infarction remained on aspirin therapy would follow-up neurology services.  Subcutaneous heparin for DVT prophylaxis no bleeding episodes.  Blood pressures monitored permissive hypertension currently on no antihypertensive medications patient would follow-up with primary MD.  Blood sugars overall controlled hemoglobin A1c of 7.1 and maintained on low-dose Amaryl with full diabetic teaching.  Metformin was not used as patient was on maintain prior to admission due to AKI and monitored closely with latest creatinine 1.64.  Hyperlipidemia with Lipitor ongoing.  He did have a history of BPH he continued on Flomax postvoid residuals were low.  Recent history of tobacco abuse again receiving counseling in regards to cessation of nicotine products.  Bouts of constipation resolved with laxative assistance.   Blood pressures were monitored on TID basis and controlled  Diabetes has been monitored with ac/hs CBG checks and SSI was use prn for tighter BS control.   He/ is continent of bowel and bladder.  He/ has made gains during rehab stay and is attending therapies  He/ will continue to receive follow up therapies   after discharge  Rehab course: During patient's stay in rehab weekly team conferences were held to monitor patient's progress, set goals and discuss barriers to discharge. At admission, patient required minimal assist ambulate 65 feet rolling walker, minimal guard stand pivot transfers, minimal guard supine to sit.  Minimal assist upper body dressing supervision lower body dressing supervision toilet  transfers  Physical exam.  Blood pressure 131/71 pulse 95 temperature 98 respirations 18 oxygen saturation 96% room air Constitutional.  Well-developed well-nourished HEENT Head.  Normocephalic atraumatic Eyes.  Pupils round and reactive to light no discharge.nystagmus Neck.  Supple nontender no JVD without  thyromegaly Cardiac regular rate rhythm without any extra sounds or murmur heard Respiratory effort normal no respiratory distress without wheeze GI.  Soft nontender positive bowel sounds without rebound Neurological.  Patient is alert hard of hearing makes good eye contact with examiner and follows commands as well as oriented x3 Motor.  45 throughout except right shoulder limited due to rotator cuff injury Skin.  Warm and dry  He/  has had improvement in activity tolerance, balance, postural control as well as ability to compensate for deficits. He/ has had improvement in functional use RUE/LUE  and RLE/LLE as well as improvement in awareness.  Working with energy conservation techniques.  Sitting with supervision donned his pants sit to stand contact-guard assist to pull pants over hips.  Ambulates from the bed to the bathroom with minimal assist no assistive device.  Maintain standing balance with contact-guard assist.  Ambulates to the sink stand with contact-guard assist while washing his hands.  Ambulates 160 feet x 2 during sessions without assistive device and minimal assistance.  Patient worked on dynamic standing balance without upper extremity support to perform tasks forward and backwards walking sidestepping.  Gather his belongings for activities day living homemaking dressing grooming hygiene.  Full family teaching completed plan discharge to home       Disposition: Discharge to home    Diet: Diabetic diet  Special Instructions: No driving smoking or alcohol  Medications at discharge 1.  Tylenol as needed 2.  Aspirin 325 mg p.o. daily 3.  Lipitor 10 mg p.o. daily 4.  Amaryl 1 mg p.o. daily 5.Xalatan ophthalmic solution 0.005% 1 drop both eyes at bedtime 6.  Claritin 10 mg p.o. daily 7.  Protonix 40 mg p.o. daily 8.  MiraLAX daily hold for loose stools 9.  Flomax 0.4 mg p.o. daily  Discharge Instructions    Ambulatory referral to Physical Medicine Rehab   Complete by:  As directed    Moderate complexity follow-up 1 to 2 weeks right thalamic infarction      Follow-up Information    Martin Fennel, MD Follow up.   Specialty: Physical Medicine and Rehabilitation Why: Office to call for appointment Contact information: [address] [place] [address] [place] [address] [phone]        Martin Beams, MD Follow up.   Specialty: Neurology Why: Call for appointment Contact information: [address] [place] [address] [phone]           Signed: Mcarthur Rossetti Maddy Graham 04/06/2020, 4:51 AM

## 2020-04-05 ENCOUNTER — Inpatient Hospital Stay (HOSPITAL_COMMUNITY): Payer: Medicare Other | Admitting: Occupational Therapy

## 2020-04-05 ENCOUNTER — Inpatient Hospital Stay (HOSPITAL_COMMUNITY): Payer: Medicare Other | Admitting: Physical Therapy

## 2020-04-05 ENCOUNTER — Inpatient Hospital Stay (HOSPITAL_COMMUNITY): Payer: Medicare Other

## 2020-04-05 LAB — GLUCOSE, CAPILLARY
Glucose-Capillary: 102 mg/dL — ABNORMAL HIGH (ref 70–99)
Glucose-Capillary: 105 mg/dL — ABNORMAL HIGH (ref 70–99)
Glucose-Capillary: 182 mg/dL — ABNORMAL HIGH (ref 70–99)
Glucose-Capillary: 112 mg/dL — ABNORMAL HIGH (ref 70–99)

## 2020-04-05 MED ORDER — LORATADINE 10 MG PO TABS: 10.0000 mg | ORAL_TABLET | Freq: Every day | ORAL | Status: DC

## 2020-04-05 MED ORDER — OMEPRAZOLE 40 MG PO CPDR: 40.0000 mg | DELAYED_RELEASE_CAPSULE | Freq: Every day | ORAL | 0 refills | Status: DC

## 2020-04-05 MED ORDER — ATORVASTATIN CALCIUM 10 MG PO TABS: 10.0000 mg | ORAL_TABLET | Freq: Every day | ORAL | 0 refills | Status: DC

## 2020-04-05 MED ORDER — TAMSULOSIN HCL 0.4 MG PO CAPS: 0.4000 mg | ORAL_CAPSULE | Freq: Every day | ORAL | 0 refills | Status: DC

## 2020-04-05 MED ORDER — ACETAMINOPHEN 325 MG PO TABS: 650.0000 mg | ORAL_TABLET | ORAL | Status: DC | PRN

## 2020-04-05 MED ORDER — VITAMIN D 50 MCG (2000 UT) PO CAPS: 1.0000 | ORAL_CAPSULE | Freq: Every day | ORAL | 0 refills | Status: DC

## 2020-04-05 MED ORDER — DOCUSATE SODIUM 100 MG PO CAPS: 100.0000 mg | ORAL_CAPSULE | Freq: Two times a day (BID) | ORAL | 0 refills | Status: DC

## 2020-04-05 MED ORDER — GLIMEPIRIDE 1 MG PO TABS: 1.0000 mg | ORAL_TABLET | Freq: Every day | ORAL | 0 refills | Status: DC

## 2020-04-05 MED ORDER — POLYETHYLENE GLYCOL 3350 17 G PO PACK: 17.0000 g | PACK | Freq: Every day | ORAL | 0 refills | Status: DC

## 2020-04-05 MED ORDER — LATANOPROST 0.005 % OP SOLN: 1.0000 [drp] | Freq: Every day | OPHTHALMIC | 12 refills | Status: DC

## 2020-04-05 MED ORDER — VITAMIN B-12 1000 MCG PO TABS: 1000.0000 ug | ORAL_TABLET | Freq: Every day | ORAL | 0 refills | Status: DC

## 2020-04-05 NOTE — Progress Notes (Signed)
Occupational Therapy Session Note  Patient Details  Name: Martin Frazier. MRN: [id number] Date of Birth: January 04, 1934  Today's Date: 04/05/2020 OT Individual Time: 3299-2426 OT Individual Time Calculation (min): 56 min    Short Term Goals: Week 2:  OT Short Term Goal 1 (Week 2): STGs = LTGS  Skilled Therapeutic Interventions/Progress Updates:    Pt greeted seated in wc and agreeable to OT treatment session. Pt reported need to go to the bathroom. Pt ambulated into bathroom with close supervision. Pt able to stand, manage clothing, and urinate with supervision. Pt then ambulated back out of bathroom and washed hands at the sink with supervision. Pt brought down to therapy gym and worked on fine motor coordination with graded peg board activity. Pt then completed 9 hole-Peg Test and Box and Block test.  9-Hole Peg Test: L-1.10  R- 55 Box and Block Test: L- 29  R- 32 Worked on hip strengthening and balance with side stepping in hallway. Had pt use hands, then take hands away for increased balance challenge. Pt returned to room at end of session and left seated in wc with alarm belt on and call bell in reach.   Therapy Documentation Precautions:  Precautions Precautions: Fall Precaution Comments: L hemiparesis/impaired coordination Restrictions Weight Bearing Restrictions: No Pain: Pain Assessment Pain Scale: 0-10 Pain Score: 0-No pain   Therapy/Group: Individual Therapy  Mal Amabile 04/05/2020, 2:00 PM

## 2020-04-05 NOTE — Progress Notes (Signed)
[facility] PHYSICAL MEDICINE & REHABILITATION PROGRESS NOTE  Subjective/Complaints: Patient having shower during OT session this morning.  Ambulated to gym S w/ RW w/o rest breaks Denies pain Refusing supplements  ROS: Patient denies CP, SOB, N/V/DObjective: Vital Signs: Blood pressure 128/66, pulse 74, temperature 98 F (36.7 C), temperature source Oral, resp. rate 17, height 5\' 8"  (1.727 m), weight 58.1 kg, SpO2 97 %. No results found. No results for input(s): WBC, HGB, HCT, PLT in the last 72 hours. No results for input(s): NA, K, CL, CO2, GLUCOSE, BUN, CREATININE, CALCIUM in the last 72 hours.  Physical Exam: BP 128/66 (BP Location: Right Arm)   Pulse 74   Temp 98 F (36.7 C) (Oral)   Resp 17   Ht 5\' 8"  (1.727 m)   Wt 58.1 kg   SpO2 97%   BMI 19.48 kg/m    General: No acute distress, taking shower during OT session Eyes- no injection , no blepharitis, no tearing, no discharge  Mood and affect are appropriate Heart: Regular rate and rhythm no rubs murmurs or extra sounds Lungs: Clear to auscultation, breathing unlabored, no rales or wheezes Abdomen: Positive bowel sounds, soft nontender to palpation, nondistended Extremities: No clubbing, cyanosis, or edema Skin: No evidence of breakdown, no evidence of rash [facility] Makes Eye contact with examiner and follows commands.   Motor: 4+-5/5 throughout, except for right shoulder, limited due to RTC injury, stable   Assessment/Plan: 1. Functional deficits secondary to right thalamic infarct which require 3+ hours per day of interdisciplinary therapy in a comprehensive inpatient rehab setting.  Physiatrist is providing close team supervision and 24 hour management of active medical problems listed below.  Physiatrist and rehab team continue to assess barriers to discharge/monitor patient progress toward functional and medical goals  Care Tool:  Bathing    Body parts bathed by patient: Right arm, Left arm, Chest, Abdomen,  Front perineal area, Buttocks, Right upper leg, Left upper leg, Right lower leg, Left lower leg, Face         Bathing assist Assist Level: Supervision/Verbal cueing     Upper Body Dressing/Undressing Upper body dressing   What is the patient wearing?: Pull over shirt    Upper body assist Assist Level: Independent    Lower Body Dressing/Undressing Lower body dressing      What is the patient wearing?: Underwear/pull up, Pants     Lower body assist Assist for lower body dressing: Supervision/Verbal cueing     Toileting Toileting    Toileting assist Assist for toileting: Supervision/Verbal cueing Assistive Device Comment: urinal   Transfers Chair/bed transfer  Transfers assist     Chair/bed transfer assist level: Supervision/Verbal cueing     Locomotion Ambulation   Ambulation assist      Assist level: Contact Guard/Touching assist Assistive device: Walker-rolling Max distance: 200'   Walk 10 feet activity   Assist     Assist level: Contact Guard/Touching assist Assistive device: Walker-rolling   Walk 50 feet activity   Assist    Assist level: Contact Guard/Touching assist Assistive device: Walker-rolling    Walk 150 feet activity   Assist Walk 150 feet activity did not occur: Safety/medical concerns  Assist level: Contact Guard/Touching assist Assistive device: Walker-rolling    Walk 10 feet on uneven surface  activity   Assist     Assist level: Contact Guard/Touching assist Assistive device: Walker-rolling   Wheelchair     Assist Will patient use wheelchair at discharge?: No(anticipate will be ambulatory)  Wheelchair 50 feet with 2 turns activity    Assist            Wheelchair 150 feet activity     Assist            Medical Problem List and Plan: 1.  Left side weakness secondary to right thalamic infarction on 03/22/2020  Continue CIR tent d/c 4/7  2.   Antithrombotics: -DVT/anticoagulation: Subcutaneous heparin             -antiplatelet therapy: Aspirin 325 mg daily 3. Pain Management: Tylenol as needed 4. Mood: Provide emotional support             -antipsychotic agents: N/A 5. Neuropsych: This patient is capable of making decisions on his own behalf. 6. Skin/Wound Care: Routine skin checks 7. Fluids/Electrolytes/Nutrition: Routine in and outs. Refused supplements 8.  Hypertension.  Patient on Altace 2.5 mg daily prior to admission.  Resume as needed Vitals:   04/05/20 0409 04/05/20 1030  BP: (!) 121/58 128/66  Pulse: 78 74  Resp: 18 17  Temp: 98 F (36.7 C)   SpO2: 97% 97%    Controlled on 4/6- 121/58  Monitor with increased mobility 9.  Diabetes mellitus with hyperglycemia.  Hemoglobin A1c 7.1.  Currently with SSI.  Patient on Glucophage 500 mg daily prior to admission.    Continue SSI and observe for improvement  Started on amaryl 1mg  daily given likely baseline kidney disease  4/6 well controlled- 105 10.  Hyperlipidemia.  Continue Lipitor 11.  BPH.  Flomax 0.4 mg daily.    PVRs low. 12. Recent history of tobacco abuse.  Counseling 13.  Acute on chronic KD.   Creatinine 1.64 on 4/2  Continue to push fluids.   Continue to monitor 14.?  Allergy in eye  Does not appear to have any physical abnormality  Cont drops  Added claritin daily 4/3 15. Slow transit constipation  Colace started 4/1  Miralax started on 4/2  No BM since 3/31---sorbitol  Moved bowels 3x 4/3  LOS: 11 days A FACE TO FACE EVALUATION WAS PERFORMED  Halcyon Heck P Aryelle Figg 04/05/2020, 1:00 PM

## 2020-04-05 NOTE — Progress Notes (Signed)
Physical Therapy Discharge Summary  Patient Details  Name: Martin Frazier. MRN: [id number] Date of Birth: 1934-08-28  Today's Date: 04/05/2020 PT Individual Time: 1000-1101 and 3710-6269 PT Individual Time Calculation (min): 61 min and 30 min   Patient has met 7 of 9 long term goals due to improved activity tolerance, improved balance, improved postural control, increased strength and improved coordination.  Patient to discharge at an ambulatory level Supervision.   Patient's care partner is hs wife who came in for family training, and is independent to provide the necessary physical assistance at discharge.  Reasons goals not met: Pt requiring CGA for car transfers and stair navigation for safety, and wife is able to provide necessary assistance.  Recommendation:  Patient will benefit from ongoing skilled PT services in home health setting to continue to advance safe functional mobility, address ongoing impairments in strength, balance, ambulation, and minimize fall risk.  Equipment: No equipment provided  Reasons for discharge: treatment goals met and discharge from hospital  Patient/family agrees with progress made and goals achieved: Yes   Skilled Therapeutic Interventions:  AM Session: Pt received seated in WC and agreeable to therapy. No report of pain. WC transport to therapy gym for time management. Pt performs BERG balance test as detailed below. Following BERG pt performs TUG without use of AD and with close supervision. Pt has some difficulty following instructions for TUG and also is preoccupied with preventing his pant from falling down, which may contribute to slower TUG score than previous attempts. Pt performs stair training with CGA for safety, and demonstrates reciprocal stepping pattern to complete. WC transport back to room and pt left seated with alarm intact and all needs within reach.  PM Session: Pt received seated in WC and agreeable to therapy. No report of pain.  PT educates on safe strategies in event of fall in home. HEP handout provided. Pt then performs standing there-ex. Holding onto back of chair pt performs alternating bilateral hip extensions, abduction, and high marching. x15 reps each.  Pt then performs sit to stand training with supervision to work on eccentric control and body mechanics. PT places cup on ground in front of pt between legs and pt reaches for cup as he is sitting down, with good carryover of eccentric control. x10 reps total.  Pt left seated in WC with alarm intact and all needs within reach.  PT Discharge Precautions/Restrictions Precautions Precautions: Fall;Other (comment) Vital Signs Therapy Vitals Pulse Rate: 74 Resp: 17 BP: 128/66 Oxygen Therapy SpO2: 97 % Pain Pain Assessment Pain Scale: 0-10 Pain Score: 0-No pain Vision/Perception  Perception Perception: Within Functional Limits Praxis Praxis: Intact  Cognition Overall Cognitive Status: Within Functional Limits for tasks assessed Arousal/Alertness: Awake/alert Orientation Level: Oriented X4 Attention: Focused;Sustained Safety/Judgment: Appears intact Sensation Sensation Hot/Cold: Appears Intact Proprioception: Appears Intact Stereognosis: Appears Intact Coordination Fine Motor Movements are Fluid and Coordinated: Yes(pt able to fasten buttons, open containers, fix hearing aids.) Motor  Motor Motor: Hemiplegia;Ataxia Motor - Skilled Clinical Observations: mild left hemiparesis (UE>LE)  Mobility Bed Mobility Bed Mobility: Supine to Sit;Sit to Supine Supine to Sit: Independent Sit to Supine: Independent Transfers Transfers: Sit to Stand;Stand to Sit;Stand Pivot Transfers Sit to Stand: Supervision/Verbal cueing Stand to Sit: Supervision/Verbal cueing Stand Pivot Transfers: Supervision/Verbal cueing Stand Pivot Transfer Details: Verbal cues for technique Transfer (Assistive device): None Locomotion  Gait Ambulation: Yes Gait Assistance:  Supervision/Verbal cueing Gait Distance (Feet): 150 Feet Assistive device: Rolling walker Gait Assistance Details: Verbal cues for technique;Verbal cues  for precautions/safety;Verbal cues for gait pattern Gait Gait: Yes Gait Pattern: Impaired Gait Pattern: Decreased step length - right;Decreased stance time - left;Decreased weight shift to left Gait velocity: decreased Stairs / Additional Locomotion Stairs: Yes Stairs Assistance: Contact Guard/Touching assist Stair Management Technique: One rail Left Number of Stairs: 12 Height of Stairs: 6 Ramp: Supervision/Verbal cueing Curb: Contact Guard/Touching assist Wheelchair Mobility Wheelchair Mobility: No  Trunk/Postural Assessment  Cervical Assessment Cervical Assessment: Exceptions to WFL(forward head posture) Thoracic Assessment Thoracic Assessment: Exceptions to WFL(thoracic kyposis and scoliosis) Lumbar Assessment Lumbar Assessment: Exceptions to WFL(posterior pelvic tilt) Postural Control Postural Control: Deficits on evaluation Righting Reactions: slightly delayed righting reactions, especially with posterior LOBs  Balance Standardized Balance Assessment Standardized Balance Assessment: Timed Up and Go Test;Berg Balance Test Berg Balance Test Sit to Stand: Able to stand  independently using hands Standing Unsupported: Able to stand 2 minutes with supervision Sitting with Back Unsupported but Feet Supported on Floor or Stool: Able to sit safely and securely 2 minutes Stand to Sit: Controls descent by using hands Transfers: Able to transfer safely, definite need of hands Standing Unsupported with Eyes Closed: Able to stand 10 seconds safely Standing Ubsupported with Feet Together: Able to place feet together independently but unable to hold for 30 seconds From Standing, Reach Forward with Outstretched Arm: Can reach forward >5 cm safely (2") From Standing Position, Pick up Object from Floor: Able to pick up shoe, needs  supervision From Standing Position, Turn to Look Behind Over each Shoulder: Turn sideways only but maintains balance Turn 360 Degrees: Able to turn 360 degrees safely but slowly Standing Unsupported, Alternately Place Feet on Step/Stool: Able to stand independently and safely and complete 8 steps in 20 seconds Standing Unsupported, One Foot in Front: Able to plae foot ahead of the other independently and hold 30 seconds Standing on One Leg: Tries to lift leg/unable to hold 3 seconds but remains standing independently Total Score: 39 Timed Up and Go Test TUG: Normal TUG Normal TUG (seconds): 20(avg 2 trials (21.50, 20.50) without AD) Static Sitting Balance Static Sitting - Balance Support: Feet supported Static Sitting - Level of Assistance: 7: Independent Dynamic Sitting Balance Dynamic Sitting - Balance Support: During functional activity Dynamic Sitting - Level of Assistance: 7: Independent Static Standing Balance Static Standing - Balance Support: During functional activity Static Standing - Level of Assistance: 5: Stand by assistance Dynamic Standing Balance Dynamic Standing - Balance Support: During functional activity Dynamic Standing - Level of Assistance: 5: Stand by assistance Extremity Assessment  RUE Assessment Active Range of Motion (AROM) Comments: shoulder flexion 0-50 degrees secondary to history of rotator cuff involvement General Strength Comments: Pt with overall strength 5/5 at the elbow flexors and extensors as well as grip.  Right shoulder flexion 2-/5 LUE Assessment Active Range of Motion (AROM) Comments: shoulder flexion 0-70 degrees with elbow extended, he was able to achieve greater AROM with elbow flexion up to 140 degrees, but unable to repeat consistently secondary to rotator cuff weakness General Strength Comments: Pt with elbow flexion/extension at 4/5, grip strength 4/5 as well. RLE Assessment RLE Assessment: Within Functional Limits LLE Assessment LLE  Assessment: Within Functional Limits    Breck Coons, PT, DPT 04/05/2020, 12:37 PM

## 2020-04-05 NOTE — Progress Notes (Signed)
Occupational Therapy Discharge Summary  Patient Details  Name: Martin Frazier. MRN: [id number] Date of Birth: [birth year]   Patient has met 12 of 12 long term goals due to improved activity tolerance, improved balance, postural control and improved coordination.  Patient to discharge at overall Supervision level.  Patient's care partner is independent to provide the necessary physical assistance at discharge.    Reasons goals not met: n/a  Recommendation:  Patient will benefit from ongoing skilled OT services in home health setting to continue to advance functional skills in the area of BADL and iADL.  Equipment: shower seat without back  Reasons for discharge: treatment goals met  Patient/family agrees with progress made and goals achieved: Yes  OT Discharge Precautions/Restrictions  Precautions Precautions: Fall Precaution Comments: L hemiparesis/impaired coordination Restrictions Weight Bearing Restrictions: No ADL ADL Eating: Independent Where Assessed-Eating: Chair Grooming: Independent Where Assessed-Grooming: Chair Upper Body Bathing: Setup Where Assessed-Upper Body Bathing: Shower Lower Body Bathing: Supervision/safety Where Assessed-Lower Body Bathing: Shower Upper Body Dressing: Independent Where Assessed-Upper Body Dressing: Edge of bed Lower Body Dressing: Supervision/safety Where Assessed-Lower Body Dressing: Edge of bed Toileting: Supervision/safety Where Assessed-Toileting: Glass blower/designer: Close supervision Toilet Transfer Method: Counselling psychologist: Energy manager: Close supervision Social research officer, government Method: Heritage manager: Grab bars, Civil engineer, contracting without back Vision Patient Visual Report: No change from baseline Visual Fields: No apparent deficits Social research officer, government: Within Functional Limits Praxis Praxis: Intact Cognition Overall Cognitive Status: Within Functional  Limits for tasks assessed Arousal/Alertness: Awake/alert Orientation Level: Oriented X4 Attention: Focused;Sustained Safety/Judgment: Appears intact Sensation Sensation Light Touch: Impaired Detail(LLE slighty impaired compared to RLE. Improved from eval, per pt report.) Hot/Cold: Appears Intact Proprioception: Appears Intact Stereognosis: Appears Intact Coordination Fine Motor Movements are Fluid and Coordinated: Yes Motor  Motor Motor: Hemiplegia;Ataxia Motor - Skilled Clinical Observations: mild left hemiparesis (UE>LE) Mobility  Bed Mobility Bed Mobility: Supine to Sit;Sit to Supine Supine to Sit: Independent Sit to Supine: Independent Transfers Sit to Stand: Supervision/Verbal cueing Stand to Sit: Supervision/Verbal cueing  Trunk/Postural Assessment  Cervical Assessment Cervical Assessment: Exceptions to WFL(forward head posture) Thoracic Assessment Thoracic Assessment: Exceptions to WFL(thoracic kyposis and scoliosis) Lumbar Assessment Lumbar Assessment: Exceptions to WFL(posterior pelvic tilt) Postural Control Postural Control: Deficits on evaluation Righting Reactions: slightly delayed righting reactions, especially with posterior LOBs  Balance Standardized Balance Assessment Standardized Balance Assessment: Timed Up and Go Test;Berg Balance Test Berg Balance Test Sit to Stand: Able to stand  independently using hands Standing Unsupported: Able to stand 2 minutes with supervision Sitting with Back Unsupported but Feet Supported on Floor or Stool: Able to sit safely and securely 2 minutes Stand to Sit: Controls descent by using hands Transfers: Able to transfer safely, definite need of hands Standing Unsupported with Eyes Closed: Able to stand 10 seconds safely Standing Ubsupported with Feet Together: Able to place feet together independently but unable to hold for 30 seconds From Standing, Reach Forward with Outstretched Arm: Can reach forward >5 cm safely  (2") From Standing Position, Pick up Object from Floor: Able to pick up shoe, needs supervision From Standing Position, Turn to Look Behind Over each Shoulder: Turn sideways only but maintains balance Turn 360 Degrees: Able to turn 360 degrees safely but slowly Standing Unsupported, Alternately Place Feet on Step/Stool: Able to stand independently and safely and complete 8 steps in 20 seconds Standing Unsupported, One Foot in Front: Able to plae foot ahead of the other independently and hold 30 seconds  Standing on One Leg: Tries to lift leg/unable to hold 3 seconds but remains standing independently Total Score: 39 Timed Up and Go Test TUG: Normal TUG Normal TUG (seconds): 20(avg 2 trials (21.50, 20.50) without AD) Static Sitting Balance Static Sitting - Balance Support: Feet supported Static Sitting - Level of Assistance: 7: Independent Dynamic Sitting Balance Dynamic Sitting - Balance Support: During functional activity Dynamic Sitting - Level of Assistance: 7: Independent Static Standing Balance Static Standing - Balance Support: During functional activity Static Standing - Level of Assistance: 5: Stand by assistance Dynamic Standing Balance Dynamic Standing - Balance Support: During functional activity Dynamic Standing - Level of Assistance: 5: Stand by assistance Extremity/Trunk Assessment RUE Assessment Active Range of Motion (AROM) Comments: shoulder flexion 0-50 degrees secondary to history of rotator cuff involvement General Strength Comments: Pt with overall strength 5/5 at the elbow flexors and extensors as well as grip.  Right shoulder flexion 2-/5 LUE Assessment Active Range of Motion (AROM) Comments: shoulder flexion 0-70 degrees with elbow extended, he was able to achieve greater AROM with elbow flexion up to 140 degrees, but unable to repeat consistently secondary to rotator cuff weakness General Strength Comments: Pt with elbow flexion/extension at 4/5, grip strength 4/5  as well.   [place] 04/05/2020, 12:56 PM

## 2020-04-05 NOTE — Plan of Care (Signed)
Pt's wife will be able to provide CGA for car transfers and stair navigation.

## 2020-04-05 NOTE — Plan of Care (Signed)
  Problem: RH BOWEL ELIMINATION Goal: RH STG MANAGE BOWEL WITH ASSISTANCE Description: STG Manage Bowel with min Assistance. Outcome: Progressing Goal: RH STG MANAGE BOWEL W/MEDICATION W/ASSISTANCE Description: STG Manage Bowel with Medication with mod I Assistance. Outcome: Progressing   Problem: RH SAFETY Goal: RH STG ADHERE TO SAFETY PRECAUTIONS W/ASSISTANCE/DEVICE Description: STG Adhere to Safety Precautions With cues/reminders Assistance/Device. Outcome: Progressing Goal: RH STG DECREASED RISK OF FALL WITH ASSISTANCE Description: STG Decreased Risk of Fall With supervision Assistance. Outcome: Progressing   Problem: RH KNOWLEDGE DEFICIT Goal: RH STG INCREASE KNOWLEDGE OF DIABETES Description: Pt will be able to manage DM with medication compliance and diet regimen and also knowing signs of hypo/hyperglycemia using handouts/booklets with mod I assist Outcome: Progressing Goal: RH STG INCREASE KNOWLEGDE OF HYPERLIPIDEMIA Description: Pt will be able to manage HLD with medication compliance and diet regimen using handouts/booklets with mod I assist Outcome: Progressing Goal: RH STG INCREASE KNOWLEDGE OF STROKE PROPHYLAXIS Description: Pt will be able to manage stroke prevention with medication compliance and diet regimen using handouts/booklets with mod I assist Outcome: Progressing

## 2020-04-05 NOTE — Progress Notes (Signed)
Occupational Therapy Session Note  Patient Details  Name: Martin Frazier. MRN: [id number] Date of Birth: [birth year]  Today's Date: 04/05/2020 OT Individual Time: 0940-7680 OT Individual Time Calculation (min): 60 min    Short Term Goals: Week 1:  OT Short Term Goal 1 (Week 1): Pt will complete toilet transfers with min assist for 3 consecutive sessions using the RW for support. OT Short Term Goal 1 - Progress (Week 1): Met OT Short Term Goal 2 (Week 1): Pt will complete walk-in shower transfers with min assist for with use of the RW for support for 2 consecutive sessions. OT Short Term Goal 2 - Progress (Week 1): Met OT Short Term Goal 3 (Week 1): Pt will complete UB dressing with supervision for a pullover or button up shirt with AE PRN. OT Short Term Goal 3 - Progress (Week 1): Met OT Short Term Goal 4 (Week 1): Pt will complete all LB dressing with min guard assist sit to stand for 3 consecutive sessions. OT Short Term Goal 4 - Progress (Week 1): Met   Week 2: STGs = LTGs  Skilled Therapeutic Interventions/Progress Updates:      Pt seen for BADL retraining of toileting, bathing/ shower, and dressing with a focus on safe mobility with Rw.  Overall pt completed all tasks with Supervision and use of his RW.  Pt moves efficiently but did need a cue when he was making a quick turn and side stepping to his R how to turn more cautiously as he almost lost his balance but was able to use the RW for support.  Pt is eager to get back on his ride on lawn mower and tractor.  Discussed waiting for clearance from the MD and working with [facility] therapies on these skills.  In the meantime he was agreeable to working on a simulated transfers to replicate what he will need to do to get into the seat.   Ambulated to the gym with RW and S, no breaks. Set up a tall curb step and a bench on top of step so pt had to step up and swivel into bench.  Pt did well with this movement.    He will benefit from more  coordination testing as he desires to get behind the wheel again.  Pt ambulated back to room with all needs met.     Therapy Documentation Precautions:  Precautions Precautions: Fall, Other (comment) Precaution Comments: L hemiparesis/impaired coordination Restrictions Weight Bearing Restrictions: No  Pain: Pain Assessment Pain Score: 0-No pain ADL: ADL Eating: Independent Where Assessed-Eating: Chair Grooming: Independent Where Assessed-Grooming: Chair Upper Body Bathing: Setup Where Assessed-Upper Body Bathing: Shower Lower Body Bathing: Supervision/safety Where Assessed-Lower Body Bathing: Shower Upper Body Dressing: Independent Where Assessed-Upper Body Dressing: Edge of bed Lower Body Dressing: Supervision/safety Where Assessed-Lower Body Dressing: Edge of bed Toileting: Supervision/safety Where Assessed-Toileting: Glass blower/designer: Close supervision Armed forces technical officer Method: Counselling psychologist: Energy manager: Close supervision Social research officer, government Method: Heritage manager: Grab bars, Shower seat without back   Therapy/Group: Individual Therapy  [place] 04/05/2020, 9:59 AM

## 2020-04-05 NOTE — Plan of Care (Signed)
  Problem: RH Balance ?Goal: LTG Patient will maintain dynamic standing with ADLs (OT) ?Description: LTG:  Patient will maintain dynamic standing balance with assist during activities of daily living (OT)  ?Outcome: Completed/Met ?  ?

## 2020-04-06 ENCOUNTER — Telehealth: Payer: Self-pay | Admitting: Family Medicine

## 2020-04-06 LAB — GLUCOSE, CAPILLARY: Glucose-Capillary: 109 mg/dL — ABNORMAL HIGH (ref 70–99)

## 2020-04-06 NOTE — Progress Notes (Signed)
Social Work Patient ID: Martin Foots., male   DOB: 03-May-1934, 86 y.o.   MRN: [id number]   Patient shower chair was ordered. Informed patient and spouse would reach out to them for insurance verification and possible payment, if applicable.  Therapist informed SW that spouse missed call and was worried she would miss it again.   Sw informed DME company that patient would like to purchase shower chair.

## 2020-04-06 NOTE — Progress Notes (Signed)
Patient received discharge instructions from Deatra Ina, PA-C with verbal understanding. Patient discharged to home with family, patient belongings, and prescriptions. All questions asked and answered.

## 2020-04-06 NOTE — Progress Notes (Signed)
Social Work Discharge Note   The overall goal for the admission was met for:   Discharge location: Yes, Home  Length of Stay: Yes, 12 Days  Discharge activity level: Yes, Supervision   Home/community participation: Yes  Services provided included: MD, RD, PT, OT, SLP, RN, CM, TR, Pharmacy, [place]: Medicare  Follow-up services arranged: Home Health: [facility])  Comments (or additional information):  Patient/Family verbalized understanding of follow-up arrangements: Yes  Individual responsible for coordination of the follow-up plan: Hovanes Hymas ([phone])  Confirmed correct DME delivered: Dyanne Iha 04/06/2020    Dyanne Iha

## 2020-04-06 NOTE — Progress Notes (Signed)
Social Work Patient ID: Westley Foots., male   DOB: [birth year], 86 y.o.   MRN: [id number]   SW received notification that shower chair order was declined/cancelled. Made 3 attempts to call spouse, left voicemail. Will continue to follow up.

## 2020-04-08 ENCOUNTER — Telehealth: Payer: Self-pay

## 2020-04-08 NOTE — Telephone Encounter (Signed)
Placed a call to Martin Frazier, no answer, left message to return the call. Discharge summary reviewed. Dr. Gerilyn Pilgrim recommended ASA 325 mg daily, will await his return call to discussed the above with him.

## 2020-04-08 NOTE — Telephone Encounter (Signed)
Transitional Care call--[name]-wife    1. Are you/is patient experiencing any problems since coming home? No  Are there any questions regarding any aspect of care? No 2. Are there any questions regarding medications administration/dosing? Concerned about taking 325 mg when [place] doctor told him not to even take 81 mg because of kidneys Are meds being taken as prescribed? Yes, not ASA until have answer Patient should review meds with caller to confirm 3. Have there been any falls? No 4. Has Home Health been to the house and/or have they contacted you? No If not, have you tried to contact them? Can we help you contact them? 5. Are bowels and bladder emptying properly? Yes Are there any unexpected incontinence issues? No If applicable, is patient following bowel/bladder programs? 6. Any fevers, problems with breathing, unexpected pain? No 7. Are there any skin problems or new areas of breakdown? No 8. Has the patient/family member arranged specialty MD follow up (ie cardiology/neurology/renal/surgical/etc)?Yes Can we help arrange? 9. Does the patient need any other services or support that we can help arrange? No 10. Are caregivers following through as expected in assisting the patient? Yes 11. Has the patient quit smoking, drinking alcohol, or using drugs as recommended? Yes  Appointment time 1:40pm, arrive time 1:20pm with [name] then Dr. [name] [address] [phone]

## 2020-04-20 ENCOUNTER — Encounter: Payer: Medicare Other | Attending: Registered Nurse | Admitting: Registered Nurse

## 2020-04-20 ENCOUNTER — Other Ambulatory Visit: Payer: Self-pay

## 2020-04-20 VITALS — BP 152/70 | HR 82 | Temp 97.7°F | Ht 68.0 in | Wt 140.0 lb

## 2020-04-20 DIAGNOSIS — E1165 Type 2 diabetes mellitus with hyperglycemia: Secondary | ICD-10-CM | POA: Diagnosis present

## 2020-04-20 DIAGNOSIS — Z8679 Personal history of other diseases of the circulatory system: Secondary | ICD-10-CM | POA: Insufficient documentation

## 2020-04-20 DIAGNOSIS — I639 Cerebral infarction, unspecified: Secondary | ICD-10-CM | POA: Diagnosis present

## 2020-04-20 DIAGNOSIS — I6381 Other cerebral infarction due to occlusion or stenosis of small artery: Secondary | ICD-10-CM

## 2020-04-20 IMAGING — DX ABDOMEN - 1 VIEW
1 series · 1 of 1 positions shown · non-contrast
Comparison: Radiograph April 23, 2019.

CLINICAL DATA: Right renal calculus.

EXAM:
ABDOMEN - 1 VIEW

[abdomen kub]
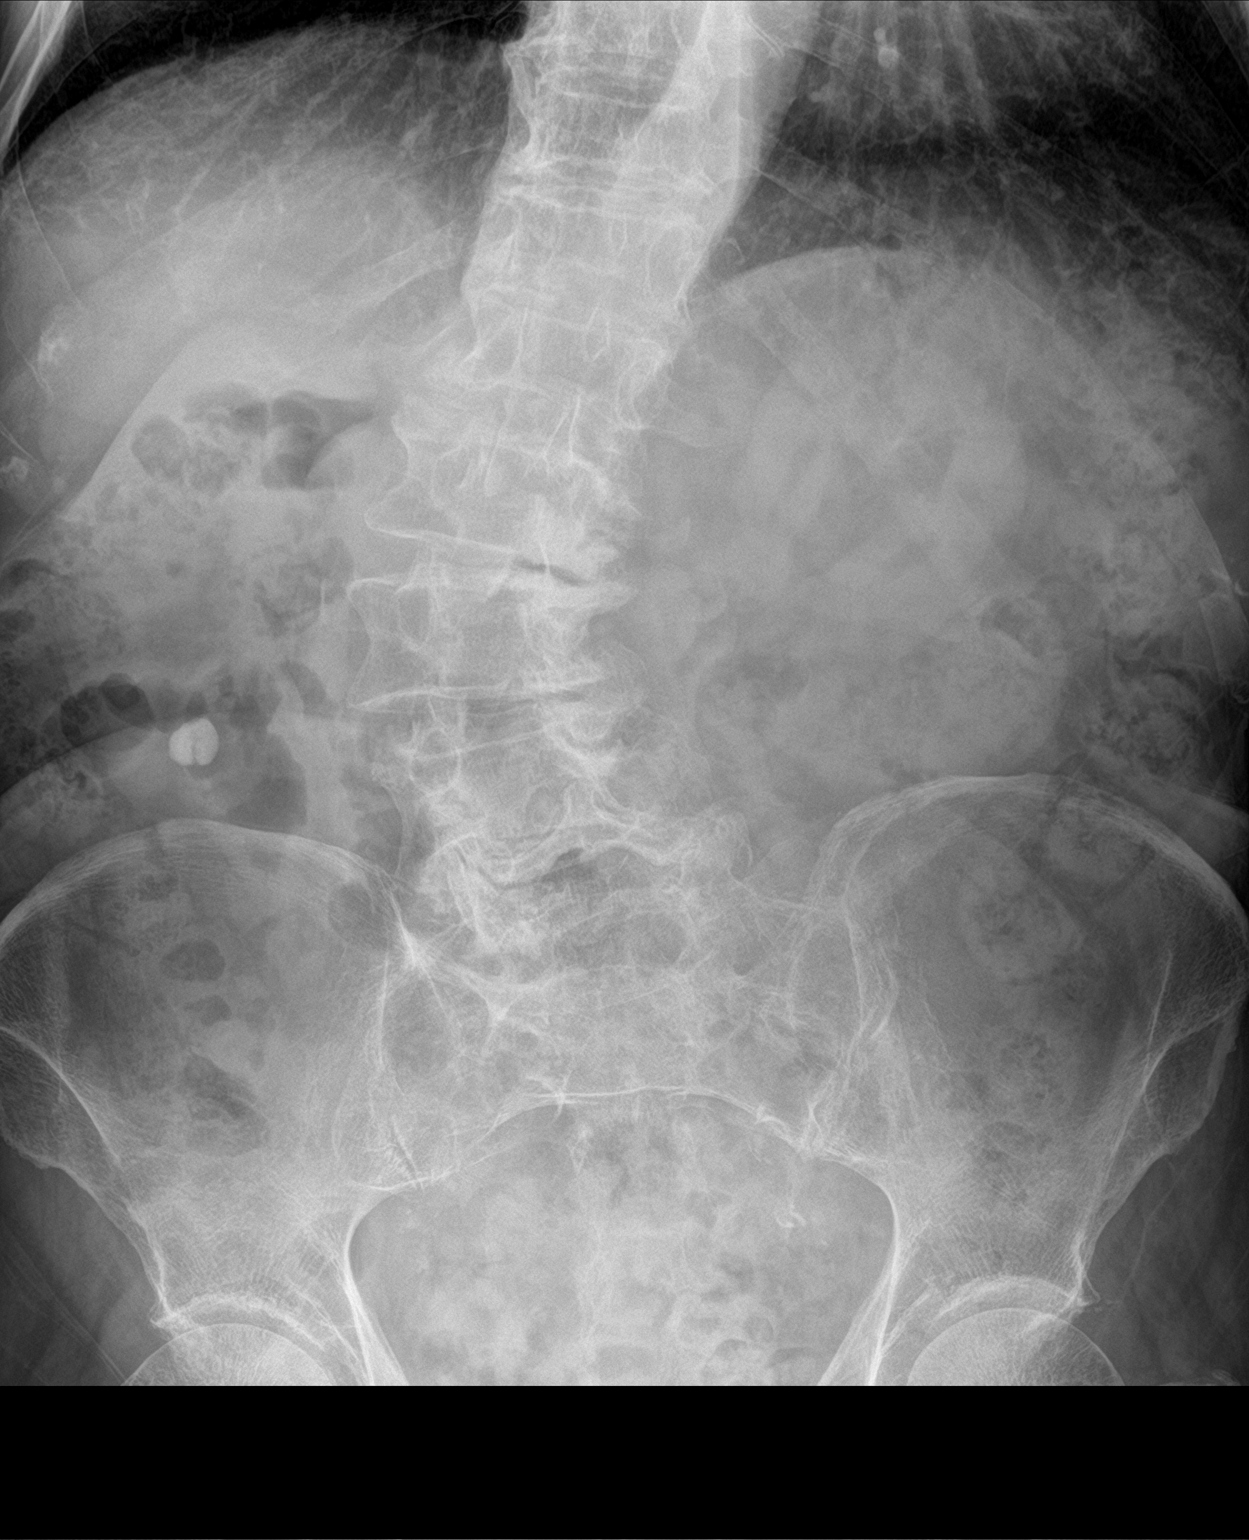

[1 of 1 positions shown; findings below may reference images not displayed]

FINDINGS: The bowel gas pattern is normal. Stable probable large right renal
calculus is noted. Right ureteral stent noted on prior exam has been
removed. Moderate dextroscoliosis of lumbar spine is noted.
IMPRESSION: Right ureteral stent noted on prior exam has been removed. Stable
large calculus seen projected over lower pole of right kidney. No
evidence of bowel obstruction or ileus.

## 2020-04-20 NOTE — Progress Notes (Signed)
Subjective:    Patient ID: Martin Frazier., male    DOB: 03-Mar-1934, 86 y.o.   MRN: [id number]  HPI: Martin Frazier. is a 86 y.o. male who is here for transitional care visit for follow up of his right thalamic infarction, history of hypertension and controlled type 2 DM with hyperglycemia without long-term current use of insulin.  [name] presented to [facility] on 03/22/2020 with left sided weakness and gait instability. Neurology was consulted.  CT Head WO Contrast:  IMPRESSION: 1. No acute intracranial findings. 2. Chronic microvascular ischemic changes and cerebral volume loss. 3. Focal encephalomalacia from remote lacunar infarct in the right thalamus. Additional small remote lacunar infarcts in the left corona radiata.  MRI Brain WO Contrast:  IMPRESSION: Acute right thalamic infarct.  Chronic microvascular ischemic changes and chronic small vessel infarcts as described.  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 Neurology advise aspirin therapy 325 mg daily.   [name] was admitted to inpatient rehabilitation on 03/26/2021and discharged home on 04/06/2020. He is receiving Home Health therapy with [facility].  He states he has pain in his left hand with tingling. He rated his pain 0.  Also reports he has a good appetite.   Wife in the room, all questions answered.     Pain Inventory Average Pain 2 Pain Right Now 0 My pain is dull  In the last 24 hours, has pain interfered with the following? General activity 5 Relation with others 1 Enjoyment of life 1 What TIME of day is your pain at its worst? daytime Sleep (in general) Fair  Pain is worse with: some activites Pain improves with: ? Relief from Meds: no pain meds  Mobility walk with assistance use a walker ability to climb steps?  no do you drive?  no  Function retired  Neuro/Psych weakness numbness tingling trouble walking  Prior  Studies transitional  Physicians involved in your care transitional   No family history on file. Social History   Socioeconomic History  . Marital status: Married    Spouse name: Not on file  . Number of children: Not on file  . Years of education: Not on file  . Highest education level: Not on file  Occupational History  . Not on file  Tobacco Use  . Smoking status: Former Smoker    Types: Cigarettes  . Smokeless tobacco: Never Used  . Tobacco comment: quit 2006  Substance and Sexual Activity  . Alcohol use: No  . Drug use: No  . Sexual activity: Not Currently  Other Topics Concern  . Not on file  Social History Narrative  . Not on file   Social Determinants of Health   Financial Resource Strain:   . Difficulty of Paying Living Expenses:   Food Insecurity:   . Worried About Programme researcher, broadcasting/film/video in the Last Year:   . Barista in the Last Year:   Transportation Needs:   . Freight forwarder (Medical):   Marland Kitchen Lack of Transportation (Non-Medical):   Physical Activity:   . Days of Exercise per Week:   . Minutes of Exercise per Session:   Stress:   . Feeling of Stress :   Social Connections:   . Frequency of Communication with Friends and Family:   . Frequency of Social Gatherings with Friends and Family:   . Attends Religious Services:   . Active Member of Clubs or Organizations:   .  Attends Archivist Meetings:   Marland Kitchen Marital Status:    Past Surgical History:  Procedure Laterality Date  . APPENDECTOMY    . BIOPSY  04/01/2019   Procedure: BIOPSY;  Surgeon: Rogene Houston, MD;  [facility];  Service: Endoscopy;;  gastric  . CYSTOSCOPY W/ URETERAL STENT PLACEMENT Right 04/03/2019   Procedure: CYSTOSCOPY WITH RETROGRADE PYELOGRAM/URETERAL STENT PLACEMENT;  Surgeon: Franchot Gallo, MD;  Location: [facility];  Service: Urology;  Laterality: Right;  . ESOPHAGOGASTRODUODENOSCOPY N/A 04/01/2019   Procedure: ESOPHAGOGASTRODUODENOSCOPY (EGD);   Surgeon: Rogene Houston, MD;  [facility];  Service: Endoscopy;  Laterality: N/A;  10:55am  . EXTRACORPOREAL SHOCK WAVE LITHOTRIPSY Right 04/16/2019   Procedure: EXTRACORPOREAL SHOCK WAVE LITHOTRIPSY (ESWL);  Surgeon: Festus Aloe, MD;  Location: [facility];  Service: Urology;  Laterality: Right;  . HEMORRHOID SURGERY    . KIDNEY STONE SURGERY    . TONSILLECTOMY     Past Medical History:  Diagnosis Date  . Collapsed lung    left  . Diabetes mellitus without complication ([place])    over 10 yrs  . GERD (gastroesophageal reflux disease)   . Hypertension   . Kidney stones    BP (!) 152/70   Pulse 82   Temp 97.7 F (36.5 C)   Ht 5\' 8"  (1.727 m)   Wt 140 lb (63.5 kg)   SpO2 95%   BMI 21.29 kg/m   Opioid Risk Score:   Fall Risk Score:  `1  Depression screen PHQ 2/9  Depression screen PHQ 2/9 04/20/2020  Decreased Interest 0  Down, Depressed, Hopeless 0  PHQ - 2 Score 0  Altered sleeping 0  Tired, decreased energy 0  Change in appetite 0  Feeling bad or failure about yourself  0  Trouble concentrating 0  Moving slowly or fidgety/restless 0  Suicidal thoughts 0  PHQ-9 Score 0    Review of Systems  Constitutional: Negative.   HENT: Negative.   Eyes: Negative.   Cardiovascular: Negative.   Gastrointestinal: Negative.   Endocrine: Negative.   Genitourinary: Negative.   Musculoskeletal: Positive for arthralgias, back pain and gait problem.  Skin: Negative.   Allergic/Immunologic: Negative.   Neurological: Positive for weakness and numbness.       Tingling   Hematological: Negative.   Psychiatric/Behavioral: Negative.   All other systems reviewed and are negative.      Objective:   Physical Exam Vitals and nursing note reviewed.  Constitutional:      Appearance: Normal appearance.  Cardiovascular:     Rate and Rhythm: Normal rate and regular rhythm.     Pulses: Normal pulses.     Heart sounds: Normal heart sounds.  Pulmonary:     Effort:  Pulmonary effort is normal.     Breath sounds: Normal breath sounds.  Musculoskeletal:     Cervical back: Normal range of motion and neck supple.     Comments: Normal Muscle Bulk and Muscle Testing Reveals:  Upper Extremities: Right: Decreased ROM 45 Degrees and Muscle Strength 5/5 Left: Decreased ROM 30 Degrees and Muscle Strength 4/5   Lower Extremities: Full ROM and Muscle Strength 5/5 Arises from Table Slowly using walker for support Narrow Based  Gait   Skin:    General: Skin is warm and dry.  Neurological:     Mental Status: He is alert and oriented to person, place, and time.  Psychiatric:        Mood and Affect: Mood normal.  Behavior: Behavior normal.           Assessment & Plan:  1. Right thalamic infarction: Continue Home Health Therapy with [facility]. He has a scheduled appointment with Neurology.  2.History of hypertension: Continue to Monitor. PCP Following.  3. Controlled type 2 DM with hyperglycemia without long-term current use of insulin: Continue to Monitor. PCP Following.    15 minutes of face to face patient care time was spent during this visit. All questions were encouraged and answered.  F/U with Dr [name] in 4- 6 weeks

## 2020-04-21 ENCOUNTER — Encounter: Payer: Self-pay | Admitting: Registered Nurse

## 2020-05-31 ENCOUNTER — Encounter: Payer: Self-pay | Admitting: Physical Medicine & Rehabilitation

## 2020-05-31 ENCOUNTER — Other Ambulatory Visit: Payer: Self-pay

## 2020-05-31 ENCOUNTER — Encounter: Payer: Medicare Other | Attending: Registered Nurse | Admitting: Physical Medicine & Rehabilitation

## 2020-05-31 VITALS — BP 155/75 | HR 71 | Temp 97.9°F | Ht 68.0 in | Wt 146.6 lb

## 2020-05-31 DIAGNOSIS — I10 Essential (primary) hypertension: Secondary | ICD-10-CM | POA: Diagnosis not present

## 2020-05-31 DIAGNOSIS — E1165 Type 2 diabetes mellitus with hyperglycemia: Secondary | ICD-10-CM | POA: Insufficient documentation

## 2020-05-31 DIAGNOSIS — Z8679 Personal history of other diseases of the circulatory system: Secondary | ICD-10-CM | POA: Insufficient documentation

## 2020-05-31 DIAGNOSIS — K5901 Slow transit constipation: Secondary | ICD-10-CM

## 2020-05-31 DIAGNOSIS — N183 Chronic kidney disease, stage 3 unspecified: Secondary | ICD-10-CM

## 2020-05-31 DIAGNOSIS — I639 Cerebral infarction, unspecified: Secondary | ICD-10-CM | POA: Insufficient documentation

## 2020-05-31 DIAGNOSIS — N1831 Chronic kidney disease, stage 3a: Secondary | ICD-10-CM | POA: Insufficient documentation

## 2020-05-31 DIAGNOSIS — I6381 Other cerebral infarction due to occlusion or stenosis of small artery: Secondary | ICD-10-CM

## 2020-05-31 NOTE — Progress Notes (Signed)
Subjective:    Patient ID: Martin Frazier., male    DOB: Nov 24, 1934, 86 y.o.   MRN: [id number]  HPI  Right-handed male with history of diabetes mellitus, hypertension and recently stopped smoking presents for follow up for right thalamic infarction.   Last clinic visit with NP on 04/20/20, notes reviewed.  Wife supplements history. Since that time, pt states he is following up with Neurology. BP is elevated, but normal most of the time per wife. He is not checking CBGs. Bowel movements are inconsistent. [facility] Released by therapies. Denies falls. Wife with questions regarding return to previous activities.   Pain Inventory Average Pain 5 Pain Right Now 5 My pain is tingling and numbness more than pain  In the last 24 hours, has pain interfered with the following? General activity 5 Relation with others 0 Enjoyment of life 5 What TIME of day is your pain at its worst? all Sleep (in general) NA  Pain is worse with: some activites Pain improves with: rest Relief from Meds: na  Mobility walk without assistance ability to climb steps?  yes do you drive?  yes  Function retired  Neuro/Psych tingling trouble walking  Prior Studies Any changes since last visit?  no  Physicians involved in your care Any changes since last visit?  no   History reviewed. No pertinent family history. Social History   Socioeconomic History  . Marital status: Married    Spouse name: Not on file  . Number of children: Not on file  . Years of education: Not on file  . Highest education level: Not on file  Occupational History  . Not on file  Tobacco Use  . Smoking status: Former Smoker    Types: Cigarettes  . Smokeless tobacco: Never Used  . Tobacco comment: quit 2006  Substance and Sexual Activity  . Alcohol use: No  . Drug use: No  . Sexual activity: Not Currently  Other Topics Concern  . Not on file  Social History Narrative  . Not on file    Social Determinants of Health   Financial Resource Strain:   . Difficulty of Paying Living Expenses:   Food Insecurity:   . Worried About Programme researcher, broadcasting/film/video in the Last Year:   . Barista in the Last Year:   Transportation Needs:   . Freight forwarder (Medical):   Marland Kitchen Lack of Transportation (Non-Medical):   Physical Activity:   . Days of Exercise per Week:   . Minutes of Exercise per Session:   Stress:   . Feeling of Stress :   Social Connections:   . Frequency of Communication with Friends and Family:   . Frequency of Social Gatherings with Friends and Family:   . Attends Religious Services:   . Active Member of Clubs or Organizations:   . Attends Banker Meetings:   Marland Kitchen Marital Status:    Past Surgical History:  Procedure Laterality Date  . APPENDECTOMY    . BIOPSY  04/01/2019   Procedure: BIOPSY;  Surgeon: Malissa Hippo, MD;  [facility];  Service: Endoscopy;;  gastric  . CYSTOSCOPY W/ URETERAL STENT PLACEMENT Right 04/03/2019   Procedure: CYSTOSCOPY WITH RETROGRADE PYELOGRAM/URETERAL STENT PLACEMENT;  Surgeon: Marcine Matar, MD;  [facility];  Service: Urology;  Laterality: Right;  . ESOPHAGOGASTRODUODENOSCOPY N/A 04/01/2019   Procedure: ESOPHAGOGASTRODUODENOSCOPY (EGD);  Surgeon: Malissa Hippo, MD;  [facility];  Service: Endoscopy;  Laterality: N/A;  10:55am  . EXTRACORPOREAL SHOCK WAVE LITHOTRIPSY Right 04/16/2019   Procedure: EXTRACORPOREAL SHOCK WAVE LITHOTRIPSY (ESWL);  Surgeon: Festus Aloe, MD;  [facility];  Service: Urology;  Laterality: Right;  . HEMORRHOID SURGERY    . KIDNEY STONE SURGERY    . TONSILLECTOMY     Past Medical History:  Diagnosis Date  . Collapsed lung    left  . Diabetes mellitus without complication ([place])    over 10 yrs  . GERD (gastroesophageal reflux disease)   . Hypertension   . Kidney stones    BP (!) 155/75   Pulse 71   Temp 97.9 F (36.6 C)   Ht 5\' 8"  (1.727  m)   Wt 146 lb 9.6 oz (66.5 kg)   SpO2 96%   BMI 22.29 kg/m   Opioid Risk Score:   Fall Risk Score:  `1  Depression screen PHQ 2/9  Depression screen [facility] 2/9 05/31/2020 04/20/2020  Decreased Interest 0 0  Down, Depressed, Hopeless 0 0  PHQ - 2 Score 0 0  Altered sleeping - 0  Tired, decreased energy - 0  Change in appetite - 0  Feeling bad or failure about yourself  - 0  Trouble concentrating - 0  Moving slowly or fidgety/restless - 0  Suicidal thoughts - 0  PHQ-9 Score - 0   Review of Systems  Constitutional: Negative.   HENT: Negative.   Eyes: Negative.   Respiratory: Negative.   Cardiovascular: Negative.   Gastrointestinal: Negative.   Endocrine: Negative.   Genitourinary: Negative.   Musculoskeletal: Positive for gait problem.  Skin: Negative.   Neurological:       Tingling  Hematological: Bruises/bleeds easily.       Plavix  Psychiatric/Behavioral: Negative.   All other systems reviewed and are negative.      Objective:   Physical Exam Constitutional: No distress . Vital signs reviewed. HENT: Normocephalic.  Atraumatic. Eyes: EOMI. No discharge. Cardiovascular: No JVD. Respiratory: Normal effort.  No stridor. GI: Non-distended. Skin: Warm and dry.  Intact. Psych: Normal mood.  Normal behavior. Musc: No edema in extremities.  No tenderness in extremities. Neuro: Alert HOH Motor: 4+-5/5 throughout, except for right shoulder, limited due to RTC injury, unchanged    Assessment & Plan:  Right-handed male with history of diabetes mellitus, hypertension and recently stopped smoking presents for follow up for right thalamic infarction.   1.  Left side weakness secondary to right thalamic infarction on 03/22/2020  Cont HEP  Cont follow up with Neuro  Discussed gradual return to driving  2.  Hypertension.    Elevated today, however, WNL per patient at other times  3.  Diabetes mellitus with hyperglycemia.    Not checking CBGs  Follow up with PCP  4.   Acute on chronic KD.   Following in up with PCP at [facility]  5. Slow transit constipation             Inconsistent  Does not want to take meds

## 2020-05-31 NOTE — Progress Notes (Deleted)
Subjective:    Patient ID: Martin Foots., male    DOB: Nov 24, 1934, 86 y.o.   MRN: [id number]  HPI  Right-handed male with history of diabetes mellitus, hypertension and recently stopped smoking presents for follow up for right thalamic infarction.   Last clinic visit with NP on 04/20/20, notes reviewed.  Wife supplements history. Since that time, pt states he is following up with Neurology. BP is elevated, but normal most of the time per wife. He is not checking CBGs. Bowel movements are inconsistent. [facility] Released by therapies. Denies falls. Wife with questions regarding return to previous activities.   Pain Inventory Average Pain 5 Pain Right Now 5 My pain is tingling and numbness more than pain  In the last 24 hours, has pain interfered with the following? General activity 5 Relation with others 0 Enjoyment of life 5 What TIME of day is your pain at its worst? all Sleep (in general) NA  Pain is worse with: some activites Pain improves with: rest Relief from Meds: na  Mobility walk without assistance ability to climb steps?  yes do you drive?  yes  Function retired  Neuro/Psych tingling trouble walking  Prior Studies Any changes since last visit?  no  Physicians involved in your care Any changes since last visit?  no   History reviewed. No pertinent family history. Social History   Socioeconomic History  . Marital status: Married    Spouse name: Not on file  . Number of children: Not on file  . Years of education: Not on file  . Highest education level: Not on file  Occupational History  . Not on file  Tobacco Use  . Smoking status: Former Smoker    Types: Cigarettes  . Smokeless tobacco: Never Used  . Tobacco comment: quit 2006  Substance and Sexual Activity  . Alcohol use: No  . Drug use: No  . Sexual activity: Not Currently  Other Topics Concern  . Not on file  Social History Narrative  . Not on file    Social Determinants of Health   Financial Resource Strain:   . Difficulty of Paying Living Expenses:   Food Insecurity:   . Worried About Programme researcher, broadcasting/film/video in the Last Year:   . Barista in the Last Year:   Transportation Needs:   . Freight forwarder (Medical):   Marland Kitchen Lack of Transportation (Non-Medical):   Physical Activity:   . Days of Exercise per Week:   . Minutes of Exercise per Session:   Stress:   . Feeling of Stress :   Social Connections:   . Frequency of Communication with Friends and Family:   . Frequency of Social Gatherings with Friends and Family:   . Attends Religious Services:   . Active Member of Clubs or Organizations:   . Attends Banker Meetings:   Marland Kitchen Marital Status:    Past Surgical History:  Procedure Laterality Date  . APPENDECTOMY    . BIOPSY  04/01/2019   Procedure: BIOPSY;  Surgeon: Malissa Hippo, MD;  [facility];  Service: Endoscopy;;  gastric  . CYSTOSCOPY W/ URETERAL STENT PLACEMENT Right 04/03/2019   Procedure: CYSTOSCOPY WITH RETROGRADE PYELOGRAM/URETERAL STENT PLACEMENT;  Surgeon: Marcine Matar, MD;  [facility];  Service: Urology;  Laterality: Right;  . ESOPHAGOGASTRODUODENOSCOPY N/A 04/01/2019   Procedure: ESOPHAGOGASTRODUODENOSCOPY (EGD);  Surgeon: Malissa Hippo, MD;  [facility];  Service: Endoscopy;  Laterality: N/A;  10:55am  . EXTRACORPOREAL SHOCK WAVE LITHOTRIPSY Right 04/16/2019   Procedure: EXTRACORPOREAL SHOCK WAVE LITHOTRIPSY (ESWL);  Surgeon: Festus Aloe, MD;  [facility];  Service: Urology;  Laterality: Right;  . HEMORRHOID SURGERY    . KIDNEY STONE SURGERY    . TONSILLECTOMY     Past Medical History:  Diagnosis Date  . Collapsed lung    left  . Diabetes mellitus without complication ([place])    over 10 yrs  . GERD (gastroesophageal reflux disease)   . Hypertension   . Kidney stones    BP (!) 155/75   Pulse 71   Temp 97.9 F (36.6 C)   Ht 5\' 8"  (1.727  m)   Wt 146 lb 9.6 oz (66.5 kg)   SpO2 96%   BMI 22.29 kg/m   Opioid Risk Score:   Fall Risk Score:  `1  Depression screen PHQ 2/9  Depression screen [facility] 2/9 05/31/2020 04/20/2020  Decreased Interest 0 0  Down, Depressed, Hopeless 0 0  PHQ - 2 Score 0 0  Altered sleeping - 0  Tired, decreased energy - 0  Change in appetite - 0  Feeling bad or failure about yourself  - 0  Trouble concentrating - 0  Moving slowly or fidgety/restless - 0  Suicidal thoughts - 0  PHQ-9 Score - 0    Pain Inventory Average Pain {NUMBERS; 0-10:5044} Pain Right Now {NUMBERS; 0-10:5044} My pain is {PAIN DESCRIPTION:21022940}  In the last 24 hours, has pain interfered with the following? General activity {NUMBERS; 0-10:5044} Relation with others {NUMBERS; 0-10:5044} Enjoyment of life {NUMBERS; 0-10:5044} What TIME of day is your pain at its worst? {TIME OF KKX:38182993} Sleep (in general) {BHH GOOD/FAIR/POOR:22877}  Pain is worse with: {ACTIVITIES:21022942} Pain improves with: {PAIN IMPROVES ZJIR:67893810} Relief from Meds: {NUMBERS; 0-10:5044}  Mobility {MOBILITY FBP:10258527}  Function {FUNCTION:21022946}  Neuro/Psych {NEURO/PSYCH:21022948}  Prior Studies {CPRM PRIOR STUDIES:21022953}  Physicians involved in your care {CPRM PHYSICIANS INVOLVED IN YOUR CARE:21022954}   History reviewed. No pertinent family history. Social History   Socioeconomic History  . Marital status: Married    Spouse name: Not on file  . Number of children: Not on file  . Years of education: Not on file  . Highest education level: Not on file  Occupational History  . Not on file  Tobacco Use  . Smoking status: Former Smoker    Types: Cigarettes  . Smokeless tobacco: Never Used  . Tobacco comment: quit 2006  Substance and Sexual Activity  . Alcohol use: No  . Drug use: No  . Sexual activity: Not Currently  Other Topics Concern  . Not on file  Social History Narrative  . Not on file   Social  Determinants of Health   Financial Resource Strain:   . Difficulty of Paying Living Expenses:   Food Insecurity:   . Worried About Charity fundraiser in the Last Year:   . Arboriculturist in the Last Year:   Transportation Needs:   . Film/video editor (Medical):   Marland Kitchen Lack of Transportation (Non-Medical):   Physical Activity:   . Days of Exercise per Week:   . Minutes of Exercise per Session:   Stress:   . Feeling of Stress :   Social Connections:   . Frequency of Communication with Friends and Family:   . Frequency of Social Gatherings with Friends and Family:   . Attends Religious Services:   . Active  Member of Clubs or Organizations:   . Attends Banker Meetings:   Marland Kitchen Marital Status:    Past Surgical History:  Procedure Laterality Date  . APPENDECTOMY    . BIOPSY  04/01/2019   Procedure: BIOPSY;  Surgeon: Malissa Hippo, MD;  [facility];  Service: Endoscopy;;  gastric  . CYSTOSCOPY W/ URETERAL STENT PLACEMENT Right 04/03/2019   Procedure: CYSTOSCOPY WITH RETROGRADE PYELOGRAM/URETERAL STENT PLACEMENT;  Surgeon: Marcine Matar, MD;  [facility];  Service: Urology;  Laterality: Right;  . ESOPHAGOGASTRODUODENOSCOPY N/A 04/01/2019   Procedure: ESOPHAGOGASTRODUODENOSCOPY (EGD);  Surgeon: Malissa Hippo, MD;  [facility];  Service: Endoscopy;  Laterality: N/A;  10:55am  . EXTRACORPOREAL SHOCK WAVE LITHOTRIPSY Right 04/16/2019   Procedure: EXTRACORPOREAL SHOCK WAVE LITHOTRIPSY (ESWL);  Surgeon: Jerilee Field, MD;  [facility];  Service: Urology;  Laterality: Right;  . HEMORRHOID SURGERY    . KIDNEY STONE SURGERY    . TONSILLECTOMY     Past Medical History:  Diagnosis Date  . Collapsed lung    left  . Diabetes mellitus without complication ([facility])    over 10 yrs  . GERD (gastroesophageal reflux disease)   . Hypertension   . Kidney stones    BP (!) 155/75   Pulse 71   Temp 97.9 F (36.6 C)   Ht 5\' 8"  (1.727 m)   Wt  146 lb 9.6 oz (66.5 kg)   SpO2 96%   BMI 22.29 kg/m   Opioid Risk Score:   Fall Risk Score:  `1  Depression screen PHQ 2/9  Depression screen [facility] 2/9 05/31/2020 04/20/2020  Decreased Interest 0 0  Down, Depressed, Hopeless 0 0  PHQ - 2 Score 0 0  Altered sleeping - 0  Tired, decreased energy - 0  Change in appetite - 0  Feeling bad or failure about yourself  - 0  Trouble concentrating - 0  Moving slowly or fidgety/restless - 0  Suicidal thoughts - 0  PHQ-9 Score - 0   Review of Systems  Constitutional: Negative.   HENT: Negative.   Eyes: Negative.   Respiratory: Negative.   Cardiovascular: Negative.   Gastrointestinal: Negative.   Endocrine: Negative.   Genitourinary: Negative.   Musculoskeletal: Positive for gait problem.  Skin: Negative.   Neurological:       Tingling  Hematological: Bruises/bleeds easily.       Plavix  Psychiatric/Behavioral: Negative.   All other systems reviewed and are negative.      Objective:   Physical Exam Constitutional: No distress . Vital signs reviewed. HENT: Normocephalic.  Atraumatic. Eyes: EOMI. No discharge. Cardiovascular: No JVD. Respiratory: Normal effort.  No stridor. GI: Non-distended. Skin: Warm and dry.  Intact. Psych: Normal mood.  Normal behavior. Musc: No edema in extremities.  No tenderness in extremities. Neuro: Alert HOH Motor: 4+-5/5 throughout, except for right shoulder, limited due to RTC injury, unchanged    Assessment & Plan:  Right-handed male with history of diabetes mellitus, hypertension and recently stopped smoking presents for follow up for right thalamic infarction.   1.  Left side weakness secondary to right thalamic infarction on 03/22/2020  Cont HEP  Cont follow up with Neuro  Discussed gradual return to driving  2.  Hypertension.    Elevated today, however, WNL per patient at other times  3.  Diabetes mellitus with hyperglycemia.    Not checking CBGs  Follow up with PCP  4.  Acute on  chronic KD.  Following in up with PCP at [facility]  5. Slow transit constipation             Inconsistent  Does not want to take meds

## 2020-08-23 ENCOUNTER — Ambulatory Visit: Payer: Medicare Other | Admitting: Physical Medicine & Rehabilitation

## 2020-08-25 ENCOUNTER — Other Ambulatory Visit: Payer: Self-pay

## 2020-08-25 ENCOUNTER — Encounter: Payer: Medicare Other | Attending: Registered Nurse | Admitting: Physical Medicine & Rehabilitation

## 2020-08-25 ENCOUNTER — Encounter: Payer: Self-pay | Admitting: Physical Medicine & Rehabilitation

## 2020-08-25 VITALS — BP 126/65 | HR 78 | Temp 98.2°F | Ht 68.0 in | Wt 139.6 lb

## 2020-08-25 DIAGNOSIS — R269 Unspecified abnormalities of gait and mobility: Secondary | ICD-10-CM | POA: Diagnosis not present

## 2020-08-25 DIAGNOSIS — I6381 Other cerebral infarction due to occlusion or stenosis of small artery: Secondary | ICD-10-CM

## 2020-08-25 DIAGNOSIS — Z8679 Personal history of other diseases of the circulatory system: Secondary | ICD-10-CM | POA: Diagnosis present

## 2020-08-25 DIAGNOSIS — S46001D Unspecified injury of muscle(s) and tendon(s) of the rotator cuff of right shoulder, subsequent encounter: Secondary | ICD-10-CM

## 2020-08-25 DIAGNOSIS — S46001A Unspecified injury of muscle(s) and tendon(s) of the rotator cuff of right shoulder, initial encounter: Secondary | ICD-10-CM | POA: Insufficient documentation

## 2020-08-25 DIAGNOSIS — E1165 Type 2 diabetes mellitus with hyperglycemia: Secondary | ICD-10-CM | POA: Insufficient documentation

## 2020-08-25 DIAGNOSIS — I639 Cerebral infarction, unspecified: Secondary | ICD-10-CM | POA: Diagnosis present

## 2020-08-25 NOTE — Progress Notes (Signed)
Subjective:    Patient ID: Martin Foots., male    DOB: [birth year], 86 y.o.   MRN: [id number]  HPI  Right-handed male with history of diabetes mellitus, hypertension and recently stopped smoking presents for follow up for right thalamic infarction.   Last clinic visit on 05/31/20.  Wife supplements history. Since that time, pt states he is doing some HEP. He has follow up with Neurology.  He is driving.  Wife says pt with concerns about numbness in his hand, which is improving, but it not prior to stroke.  BP is controlled. Denies falls. Constipation improving.   Pain Inventory Average Pain 8 Pain Right Now 0 My pain is tingling and numbness more than pain but has scoliosis in his back  In the last 24 hours, has pain interfered with the following? General activity 0 Relation with others 0 Enjoyment of life 0 What TIME of day is your pain at its worst? mostly no pain Sleep (in general) Good  Pain is worse with: some activites Pain improves with: rest Relief from Meds: na  Mobility walk without assistance ability to climb steps?  yes do you drive?  yes  Function retired  Neuro/Psych bladder control problems numbness tingling trouble walking  Prior Studies Any changes since last visit?  no  Physicians involved in your care Any changes since last visit?  no   No family history on file. Social History   Socioeconomic History   Marital status: Married    Spouse name: Not on file   Number of children: Not on file   Years of education: Not on file   Highest education level: Not on file  Occupational History   Not on file  Tobacco Use   Smoking status: Former Smoker    Types: Cigarettes   Smokeless tobacco: Never Used   Tobacco comment: quit 2006  Vaping Use   Vaping Use: Never used  Substance and Sexual Activity   Alcohol use: No   Drug use: No   Sexual activity: Not Currently  Other Topics Concern   Not on file  Social History Narrative    Not on file   Social Determinants of Health   Financial Resource Strain:    Difficulty of Paying Living Expenses: Not on file  Food Insecurity:    Worried About Running Out of Food in the Last Year: Not on file   [employer] of Food in the Last Year: Not on file  Transportation Needs:    Lack of Transportation (Medical): Not on file   Lack of Transportation (Non-Medical): Not on file  Physical Activity:    Days of Exercise per Week: Not on file   Minutes of Exercise per Session: Not on file  Stress:    Feeling of Stress : Not on file  Social Connections:    Frequency of Communication with Friends and Family: Not on file   Frequency of Social Gatherings with Friends and Family: Not on file   Attends Religious Services: Not on file   Active Member of Clubs or Organizations: Not on file   Attends Banker Meetings: Not on file   Marital Status: Not on file   Past Surgical History:  Procedure Laterality Date   APPENDECTOMY     BIOPSY  04/01/2019   Procedure: BIOPSY;  Surgeon: Malissa Hippo, MD;  [facility];  Service: Endoscopy;;  gastric   CYSTOSCOPY W/ URETERAL STENT PLACEMENT Right 04/03/2019   Procedure: CYSTOSCOPY WITH  RETROGRADE PYELOGRAM/URETERAL STENT PLACEMENT;  Surgeon: Marcine Matar, MD;  [facility];  Service: Urology;  Laterality: Right;   ESOPHAGOGASTRODUODENOSCOPY N/A 04/01/2019   Procedure: ESOPHAGOGASTRODUODENOSCOPY (EGD);  Surgeon: Malissa Hippo, MD;  [facility];  Service: Endoscopy;  Laterality: N/A;  10:55am   EXTRACORPOREAL SHOCK WAVE LITHOTRIPSY Right 04/16/2019   Procedure: EXTRACORPOREAL SHOCK WAVE LITHOTRIPSY (ESWL);  Surgeon: Jerilee Field, MD;  [facility];  Service: Urology;  Laterality: Right;   HEMORRHOID SURGERY     KIDNEY STONE SURGERY     TONSILLECTOMY     Past Medical History:  Diagnosis Date   Collapsed lung    left   Diabetes mellitus without complication ([facility])     over 10 yrs   GERD (gastroesophageal reflux disease)    Hypertension    Kidney stones    BP 126/65    Pulse 78    Temp 98.2 F (36.8 C)    Ht 5\' 8"  (1.727 m)    Wt 139 lb 9.6 oz (63.3 kg)    SpO2 95%    BMI 21.23 kg/m   Opioid Risk Score:   Fall Risk Score:  `1  Depression screen PHQ 2/9  Depression screen [facility] 2/9 08/25/2020 05/31/2020 04/20/2020  Decreased Interest 0 0 0  Down, Depressed, Hopeless 0 0 0  PHQ - 2 Score 0 0 0  Altered sleeping - - 0  Tired, decreased energy - - 0  Change in appetite - - 0  Feeling bad or failure about yourself  - - 0  Trouble concentrating - - 0  Moving slowly or fidgety/restless - - 0  Suicidal thoughts - - 0  PHQ-9 Score - - 0   Review of Systems  Constitutional: Negative.   HENT: Negative.   Eyes: Negative.   Respiratory: Negative.   Cardiovascular: Negative.   Gastrointestinal: Negative.   Endocrine: Negative.   Genitourinary: Positive for enuresis and urgency.  Musculoskeletal: Positive for gait problem.  Skin: Negative.   Neurological: Positive for numbness.       Tingling  Hematological: Bruises/bleeds easily.       Plavix  Psychiatric/Behavioral: Negative.   All other systems reviewed and are negative.      Objective:   Physical Exam Constitutional: NAD. Vital signs reviewed. HENT: Normocephalic.  Atraumatic. Eyes: EOMI. No discharge. Cardiovascular: No  JVD. Respiratory: Normal effort.  No stridor. GI: Non-distended. Skin: Warm and dry.  Intact. Psych: Normal mood.  Normal behavior. Musc: No edema in extremities.  No tenderness in extremities. Gait: Kyphotic  Neuro: Alert Very HOH Motor: 4+-5/5 throughout, except for right shoulder, limited due to RTC injury, stable    Assessment & Plan:  Right-handed male with history of diabetes mellitus, hypertension and recently stopped smoking presents for follow up for right thalamic infarction.   1.  Left side weakness and numbness secondary to right thalamic infarction  on 03/22/2020  Continue HEP  Cont follow up with Neuro  Driving again  Discussed stroke recovery  2. Slow transit constipation             Improving  3. Gait abnormality - Kyphotic  Encouraged appropriate posture  4. B/l, R>L rotator cuff injury  Does not want any intervention at this time

## 2020-10-16 IMAGING — US ULTRASOUND ABDOMEN COMPLETE
1 series · 13 of 25 positions shown · non-contrast
Comparison: Ultrasound March 06, 2018.

CLINICAL DATA: Acute generalized abdominal pain and nausea.

EXAM:
ABDOMEN ULTRASOUND COMPLETE

[Series 1: ultrasound abdomen complete · 0.19mm/px · 13 of 124 slices shown]
[im 1/124]
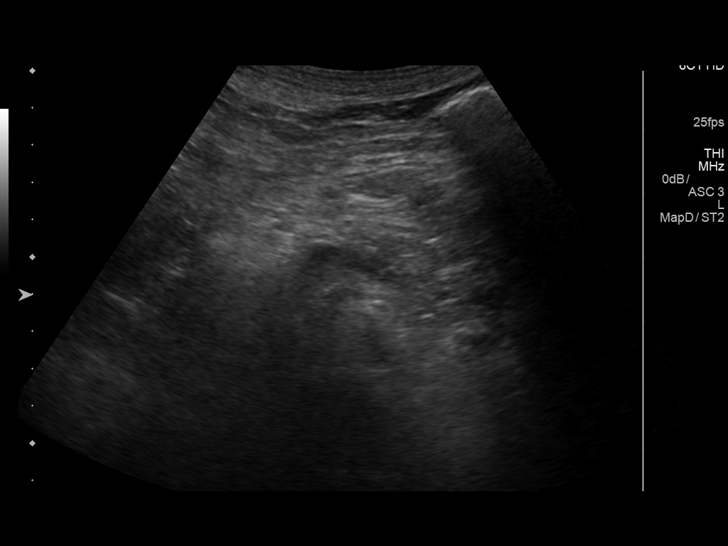
[im 11/124]
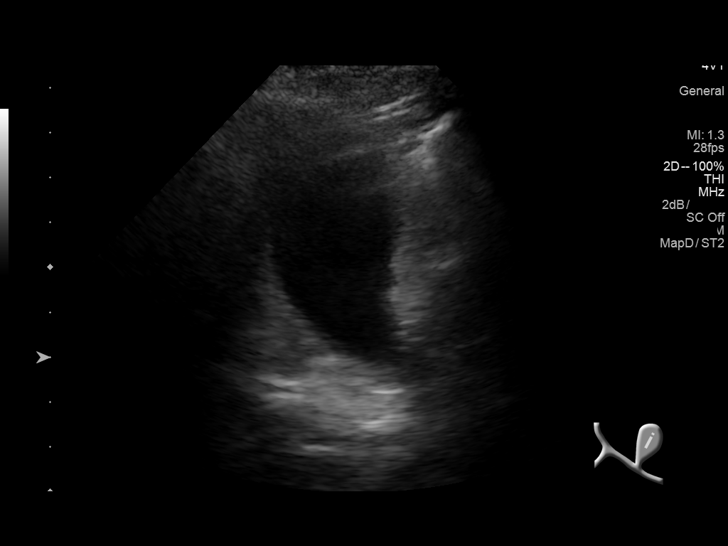
[im 21/124]
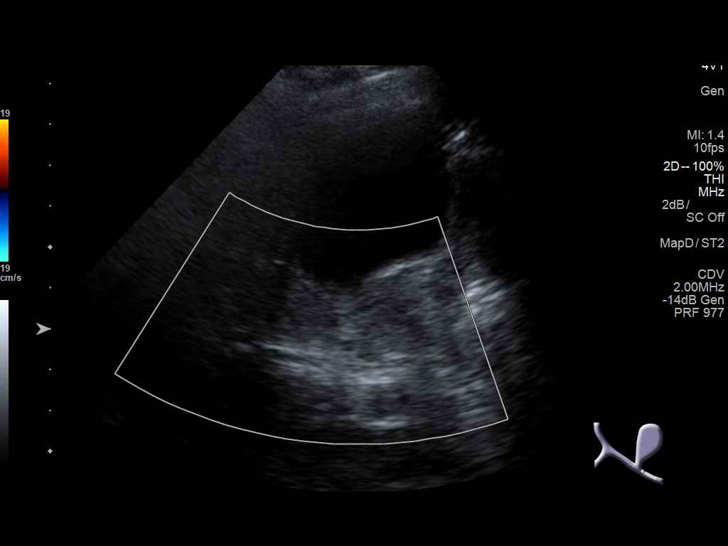
[im 31/124]
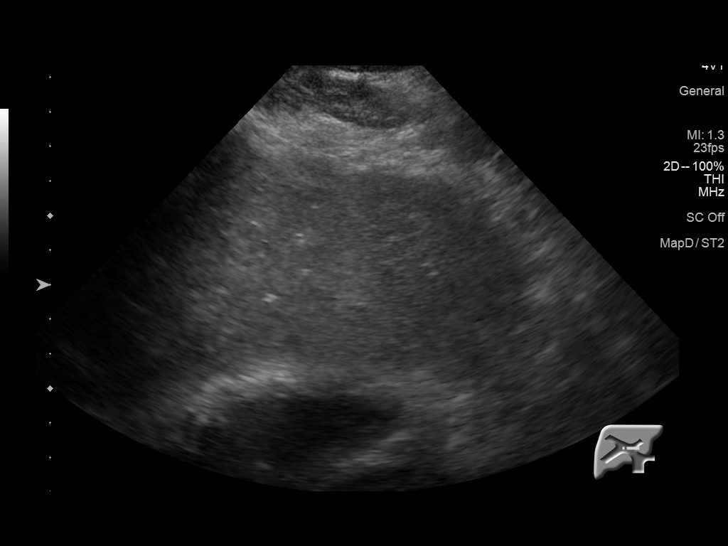
[im 42/124]
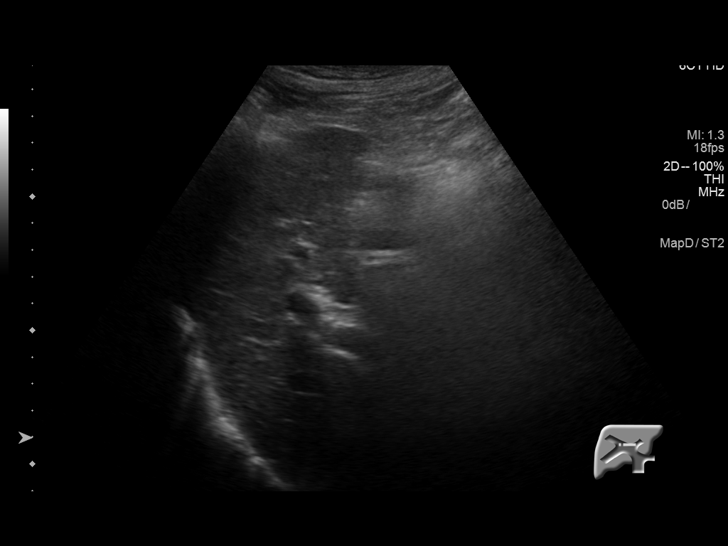
[im 52/124]
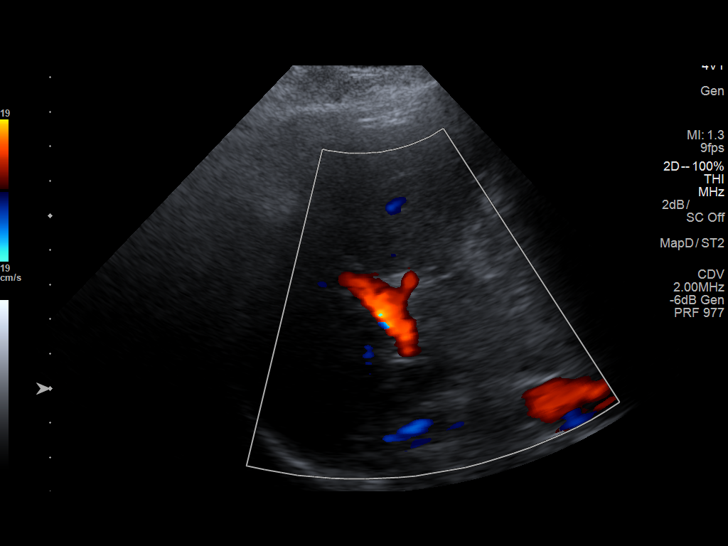
[im 62/124]
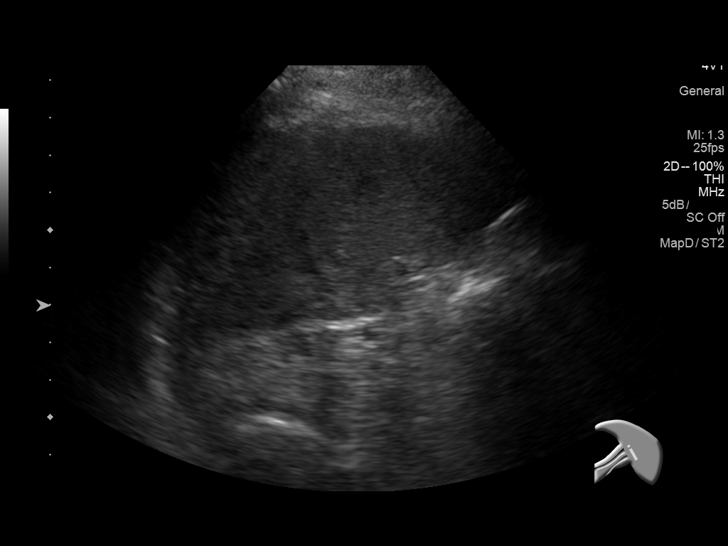
[im 72/124]
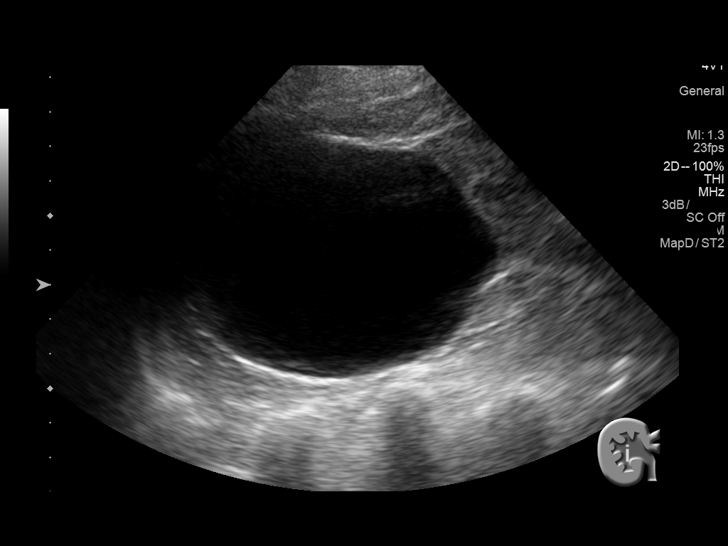
[im 83/124]
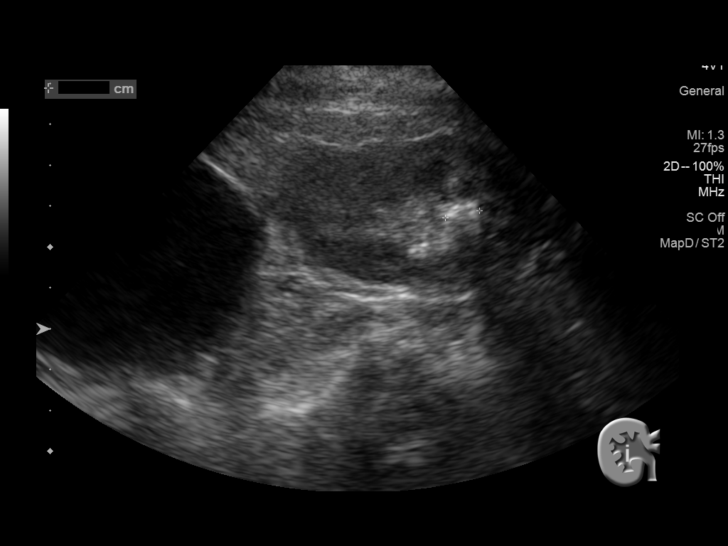
[im 93/124]
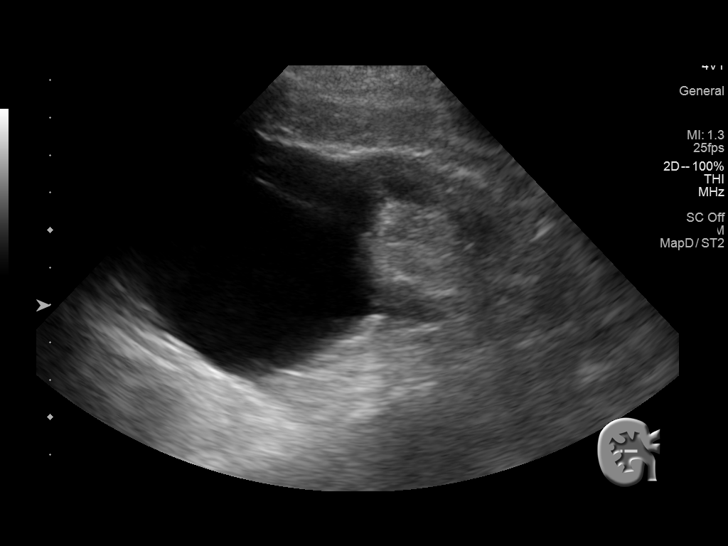
[im 103/124]
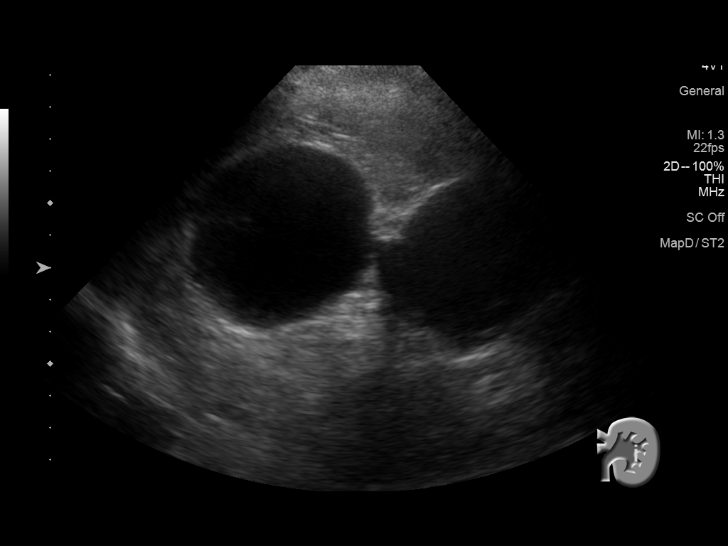
[im 113/124]
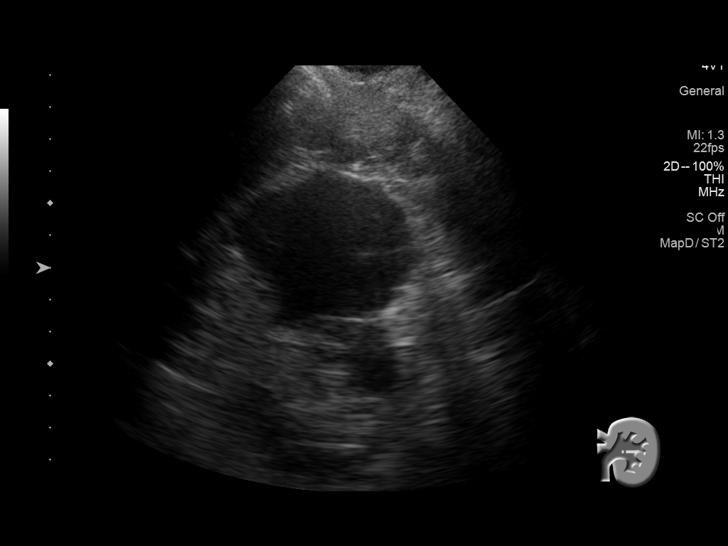
[im 124/124]
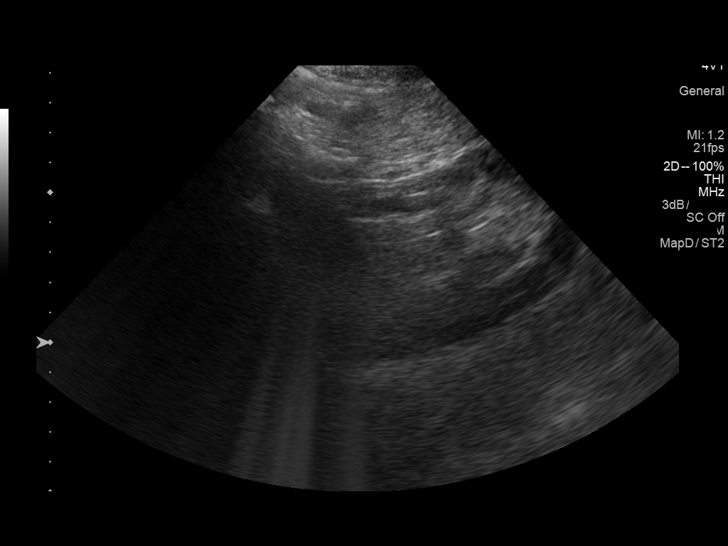

[13 of 25 positions shown; findings below may reference images not displayed]

FINDINGS: Gallbladder: No gallstones or wall thickening visualized. No
sonographic Murphy sign noted by sonographer.

Common bile duct: Diameter: 3 mm which is within normal limits.

Liver: No focal lesion identified. Within normal limits in
parenchymal echogenicity. Portal vein is patent on color Doppler
imaging with normal direction of blood flow towards the liver.

IVC: No abnormality visualized.

Pancreas: Visualized portion unremarkable.

Spleen: Size and appearance within normal limits.

Right Kidney: Length: 10.5 cm. 2.2 cm cyst is noted lower pole.
cm simple cyst is noted in upper pole. 9 mm nonobstructive calculus
is noted in lower pole. Echogenicity within normal limits. No mass
or hydronephrosis visualized.

Left Kidney: Length: 12.8 cm. 5.8 cm simple cyst is noted in upper
pole. 5 cm cyst is noted in lower pole. Echogenicity within normal
limits. No mass or hydronephrosis visualized.

Abdominal aorta: No aneurysm visualized.

Other findings: None.
IMPRESSION: Bilateral simple renal cysts. No other abnormality seen in the
abdomen.

## 2021-02-28 ENCOUNTER — Encounter: Payer: Medicare Other | Attending: Physical Medicine & Rehabilitation | Admitting: Physical Medicine & Rehabilitation

## 2021-02-28 ENCOUNTER — Encounter: Payer: Self-pay | Admitting: Physical Medicine & Rehabilitation

## 2021-02-28 ENCOUNTER — Other Ambulatory Visit: Payer: Self-pay

## 2021-02-28 VITALS — BP 152/73 | HR 80 | Temp 97.8°F | Ht 68.0 in | Wt 139.6 lb

## 2021-02-28 DIAGNOSIS — I639 Cerebral infarction, unspecified: Secondary | ICD-10-CM | POA: Insufficient documentation

## 2021-02-28 DIAGNOSIS — M12812 Other specific arthropathies, not elsewhere classified, left shoulder: Secondary | ICD-10-CM | POA: Diagnosis present

## 2021-02-28 DIAGNOSIS — R269 Unspecified abnormalities of gait and mobility: Secondary | ICD-10-CM | POA: Diagnosis present

## 2021-02-28 DIAGNOSIS — I6381 Other cerebral infarction due to occlusion or stenosis of small artery: Secondary | ICD-10-CM

## 2021-02-28 DIAGNOSIS — M12811 Other specific arthropathies, not elsewhere classified, right shoulder: Secondary | ICD-10-CM | POA: Insufficient documentation

## 2021-02-28 NOTE — Progress Notes (Signed)
Subjective:    Patient ID: Martin Foots., male    DOB: 04-Oct-1934, 87 y.o.   MRN: [id number]  HPI  Right-handed male with history of diabetes mellitus, hypertension and recently stopped smoking presents for follow up for right thalamic infarction.   Last clinic visit on 08/25/20.  Wife supplements history. Since that time, pt states he is being active, but not doing HEP. He was released from Neuro. Has had falls doing work in the yard. He complains of left rotator cuff pain.   Pain Inventory Average Pain 4 Pain Right Now 2 My pain is burning, stabbing, tingling and numbness more than pain but has scoliosis in his back  In the last 24 hours, has pain interfered with the following? General activity 4 Relation with others 3 Enjoyment of life 5 What TIME of day is your pain at its worst? morning Sleep (in general) Fair  Pain is worse with: standing and some activites Pain improves with: rest and medication Relief from Meds: 4     No family history on file. Social History   Socioeconomic History  . Marital status: Married    Spouse name: Not on file  . Number of children: Not on file  . Years of education: Not on file  . Highest education level: Not on file  Occupational History  . Not on file  Tobacco Use  . Smoking status: Former Smoker    Types: Cigarettes  . Smokeless tobacco: Never Used  . Tobacco comment: quit 2006  Vaping Use  . Vaping Use: Never used  Substance and Sexual Activity  . Alcohol use: No  . Drug use: No  . Sexual activity: Not Currently  Other Topics Concern  . Not on file  Social History Narrative  . Not on file   Social Determinants of Health   Financial Resource Strain: Not on file  Food Insecurity: Not on file  Transportation Needs: Not on file  Physical Activity: Not on file  Stress: Not on file  Social Connections: Not on file   Past Surgical History:  Procedure Laterality Date  . APPENDECTOMY    . BIOPSY  04/01/2019    Procedure: BIOPSY;  Surgeon: Malissa Hippo, MD;  [facility];  Service: Endoscopy;;  gastric  . CYSTOSCOPY W/ URETERAL STENT PLACEMENT Right 04/03/2019   Procedure: CYSTOSCOPY WITH RETROGRADE PYELOGRAM/URETERAL STENT PLACEMENT;  Surgeon: Marcine Matar, MD;  [facility];  Service: Urology;  Laterality: Right;  . ESOPHAGOGASTRODUODENOSCOPY N/A 04/01/2019   Procedure: ESOPHAGOGASTRODUODENOSCOPY (EGD);  Surgeon: Malissa Hippo, MD;  [facility];  Service: Endoscopy;  Laterality: N/A;  10:55am  . EXTRACORPOREAL SHOCK WAVE LITHOTRIPSY Right 04/16/2019   Procedure: EXTRACORPOREAL SHOCK WAVE LITHOTRIPSY (ESWL);  Surgeon: Jerilee Field, MD;  [facility];  Service: Urology;  Laterality: Right;  . HEMORRHOID SURGERY    . KIDNEY STONE SURGERY    . TONSILLECTOMY     Past Medical History:  Diagnosis Date  . Collapsed lung    left  . Diabetes mellitus without complication ([facility])    over 10 yrs  . GERD (gastroesophageal reflux disease)   . Hypertension   . Kidney stones    BP (!) 152/73   Pulse 80   Temp 97.8 F (36.6 C)   Ht 5\' 8"  (1.727 m)   Wt 139 lb 9.6 oz (63.3 kg)   SpO2 97%   BMI 21.23 kg/m   Opioid Risk Score:   Fall Risk Score:  `  1  Depression screen PHQ 2/9  Depression screen [facility] 2/9 02/28/2021 08/25/2020 05/31/2020 04/20/2020  Decreased Interest 0 0 0 0  Down, Depressed, Hopeless 0 0 0 0  PHQ - 2 Score 0 0 0 0  Altered sleeping - - - 0  Tired, decreased energy - - - 0  Change in appetite - - - 0  Feeling bad or failure about yourself  - - - 0  Trouble concentrating - - - 0  Moving slowly or fidgety/restless - - - 0  Suicidal thoughts - - - 0  PHQ-9 Score - - - 0   Review of Systems  Constitutional: Negative.   HENT: Negative.   Eyes: Negative.   Respiratory: Negative.   Cardiovascular: Negative.   Gastrointestinal: Negative.   Endocrine: Negative.   Genitourinary: Positive for enuresis and urgency.  Musculoskeletal: Positive for  arthralgias and gait problem.  Skin: Negative.   Neurological: Positive for numbness.       Tingling  Hematological: Bruises/bleeds easily.       Plavix  Psychiatric/Behavioral: Negative.   All other systems reviewed and are negative.     Objective:   Physical Exam  Constitutional: No distress . Vital signs reviewed. HENT: Normocephalic.  Atraumatic. Eyes: EOMI. No discharge. Cardiovascular: No JVD.   Respiratory: Normal effort.  No stridor.   GI: Non-distended.   Skin: Warm and dry.  Intact. Psych: Normal mood.  Normal behavior. Musc: No edema in extremities.  No tenderness in extremities. Limited ER in RUE>LUE Gait: Kyphotic Neuro: Alert Very HOH Motor: 4+-5/5 throughout, except for shoulders, limited due to RTC injury, unchanged    Assessment & Plan:  Right-handed male with history of diabetes mellitus, hypertension and recently stopped smoking presents for follow up for right thalamic infarction.   1.  Left side weakness and numbness secondary to right thalamic infarction on 03/22/2020  Encouraged HEP  Released by Neuro  Driving again  2. Slow transit constipation             Improving  3. Gait abnormality - Kyphotic  Encouraged appropriate posture again   4. B/l, R>L rotator cuff injury  Does not want any intervention at this time  Encouraged stretching/strengthening excercises  Encouraged Voltaren gel

## 2021-05-30 ENCOUNTER — Ambulatory Visit: Payer: Medicare Other | Admitting: Physical Medicine & Rehabilitation

## 2021-08-31 ENCOUNTER — Ambulatory Visit: Payer: Medicare Other | Admitting: Physical Medicine & Rehabilitation

## 2022-04-17 ENCOUNTER — Encounter (HOSPITAL_COMMUNITY): Payer: Self-pay

## 2022-04-17 ENCOUNTER — Inpatient Hospital Stay (HOSPITAL_COMMUNITY)
Admission: EM | Admit: 2022-04-17 | Discharge: 2022-04-21 | DRG: 247 | Disposition: A | Discharge status: Home / Self Care | Payer: Medicare Other | Attending: Internal Medicine | Admitting: Internal Medicine

## 2022-04-17 ENCOUNTER — Other Ambulatory Visit: Payer: Self-pay

## 2022-04-17 ENCOUNTER — Emergency Department (HOSPITAL_COMMUNITY): Payer: Medicare Other

## 2022-04-17 DIAGNOSIS — I130 Hypertensive heart and chronic kidney disease with heart failure and stage 1 through stage 4 chronic kidney disease, or unspecified chronic kidney disease: Secondary | ICD-10-CM | POA: Diagnosis present

## 2022-04-17 DIAGNOSIS — Z79899 Other long term (current) drug therapy: Secondary | ICD-10-CM

## 2022-04-17 DIAGNOSIS — Z20822 Contact with and (suspected) exposure to covid-19: Secondary | ICD-10-CM | POA: Diagnosis present

## 2022-04-17 DIAGNOSIS — I252 Old myocardial infarction: Secondary | ICD-10-CM

## 2022-04-17 DIAGNOSIS — I69328 Other speech and language deficits following cerebral infarction: Secondary | ICD-10-CM

## 2022-04-17 DIAGNOSIS — Z882 Allergy status to sulfonamides status: Secondary | ICD-10-CM

## 2022-04-17 DIAGNOSIS — I25118 Atherosclerotic heart disease of native coronary artery with other forms of angina pectoris: Principal | ICD-10-CM | POA: Diagnosis present

## 2022-04-17 DIAGNOSIS — E785 Hyperlipidemia, unspecified: Secondary | ICD-10-CM

## 2022-04-17 DIAGNOSIS — E119 Type 2 diabetes mellitus without complications: Secondary | ICD-10-CM

## 2022-04-17 DIAGNOSIS — R079 Chest pain, unspecified: Secondary | ICD-10-CM | POA: Diagnosis present

## 2022-04-17 DIAGNOSIS — R0789 Other chest pain: Principal | ICD-10-CM

## 2022-04-17 DIAGNOSIS — I255 Ischemic cardiomyopathy: Secondary | ICD-10-CM | POA: Diagnosis present

## 2022-04-17 DIAGNOSIS — Z8249 Family history of ischemic heart disease and other diseases of the circulatory system: Secondary | ICD-10-CM

## 2022-04-17 DIAGNOSIS — E1142 Type 2 diabetes mellitus with diabetic polyneuropathy: Secondary | ICD-10-CM | POA: Diagnosis present

## 2022-04-17 DIAGNOSIS — Z8673 Personal history of transient ischemic attack (TIA), and cerebral infarction without residual deficits: Secondary | ICD-10-CM

## 2022-04-17 DIAGNOSIS — R9439 Abnormal result of other cardiovascular function study: Secondary | ICD-10-CM | POA: Diagnosis present

## 2022-04-17 DIAGNOSIS — K219 Gastro-esophageal reflux disease without esophagitis: Secondary | ICD-10-CM | POA: Diagnosis present

## 2022-04-17 DIAGNOSIS — N1831 Chronic kidney disease, stage 3a: Secondary | ICD-10-CM

## 2022-04-17 DIAGNOSIS — Z7984 Long term (current) use of oral hypoglycemic drugs: Secondary | ICD-10-CM

## 2022-04-17 DIAGNOSIS — Z7902 Long term (current) use of antithrombotics/antiplatelets: Secondary | ICD-10-CM

## 2022-04-17 DIAGNOSIS — I5022 Chronic systolic (congestive) heart failure: Secondary | ICD-10-CM

## 2022-04-17 DIAGNOSIS — E1122 Type 2 diabetes mellitus with diabetic chronic kidney disease: Secondary | ICD-10-CM | POA: Diagnosis present

## 2022-04-17 DIAGNOSIS — Z87891 Personal history of nicotine dependence: Secondary | ICD-10-CM

## 2022-04-17 DIAGNOSIS — I10 Essential (primary) hypertension: Secondary | ICD-10-CM | POA: Diagnosis present

## 2022-04-17 HISTORY — DX: Personal history of transient ischemic attack (TIA), and cerebral infarction without residual deficits: Z86.73

## 2022-04-17 LAB — CBC
HCT: 37.1 % — ABNORMAL LOW (ref 39.0–52.0)
Hemoglobin: 12 g/dL — ABNORMAL LOW (ref 13.0–17.0)
MCH: 32.6 pg (ref 26.0–34.0)
MCHC: 32.3 g/dL (ref 30.0–36.0)
MCV: 100.8 fL — ABNORMAL HIGH (ref 80.0–100.0)
Platelets: 184 10*3/uL (ref 150–400)
RBC: 3.68 MIL/uL — ABNORMAL LOW (ref 4.22–5.81)
RDW: 13.2 % (ref 11.5–15.5)
WBC: 6.1 10*3/uL (ref 4.0–10.5)
nRBC: 0 % (ref 0.0–0.2)

## 2022-04-17 LAB — BASIC METABOLIC PANEL
Anion gap: 7 (ref 5–15)
BUN: 34 mg/dL — ABNORMAL HIGH (ref 8–23)
CO2: 25 mmol/L (ref 22–32)
Calcium: 9.1 mg/dL (ref 8.9–10.3)
Chloride: 110 mmol/L (ref 98–111)
Creatinine, Ser: 1.85 mg/dL — ABNORMAL HIGH (ref 0.61–1.24)
GFR, Estimated: 35 mL/min — ABNORMAL LOW (ref >60)
Glucose, Bld: 202 mg/dL — ABNORMAL HIGH (ref 70–99)
Potassium: 4.3 mmol/L (ref 3.5–5.1)
Sodium: 142 mmol/L (ref 135–145)

## 2022-04-17 LAB — RESP PANEL BY RT-PCR (FLU A&B, COVID) ARPGX2
Influenza A by PCR: NEGATIVE
Influenza B by PCR: NEGATIVE
SARS Coronavirus 2 by RT PCR: NEGATIVE

## 2022-04-17 LAB — HEPATIC FUNCTION PANEL
ALT: 19 U/L (ref 0–44)
AST: 26 U/L (ref 15–41)
Albumin: 4.1 g/dL (ref 3.5–5.0)
Alkaline Phosphatase: 140 U/L — ABNORMAL HIGH (ref 38–126)
Bilirubin, Direct: 0.1 mg/dL (ref 0.0–0.2)
Indirect Bilirubin: 0.4 mg/dL (ref 0.3–0.9)
Total Bilirubin: 0.5 mg/dL (ref 0.3–1.2)
Total Protein: 6.5 g/dL (ref 6.5–8.1)

## 2022-04-17 LAB — TROPONIN I (HIGH SENSITIVITY): Troponin I (High Sensitivity): 15 ng/L (ref <18)

## 2022-04-17 LAB — LIPASE, BLOOD: Lipase: 42 U/L (ref 11–51)

## 2022-04-17 MED ORDER — PANTOPRAZOLE SODIUM 40 MG IV SOLR
40.0000 mg | Freq: Once | INTRAVENOUS | Status: AC
  Administered 2022-04-17: 40 mg via INTRAVENOUS
  Filled 2022-04-17: qty 10

## 2022-04-17 MED ORDER — NITROGLYCERIN 2 % TD OINT
1.0000 [in_us] | TOPICAL_OINTMENT | Freq: Once | TRANSDERMAL | Status: AC
  Administered 2022-04-17: 1 [in_us] via TOPICAL
  Filled 2022-04-17: qty 1

## 2022-04-17 NOTE — ED Provider Notes (Signed)
?[facility] ?Provider Note ? ? ?CSN: [id number] ?Arrival date & time: 04/17/22  2027 ? ?  ? ?History ? ?Chief Complaint  ?Patient presents with  ? Chest Pain  ? ? ?Martin Frazier. is a 88 y.o. male. ? ?Patient complains of shortness of breath and chest pain.  When he ambulates he is having increasing chest discomfort.  Patient has a history of diabetes and hypertension ? ?The history is provided by the patient and medical records. No language interpreter was used.  ?Chest Pain ?Pain location:  L chest ?Pain quality: aching   ?Pain radiates to:  Does not radiate ?Pain severity:  Moderate ?Onset quality:  Sudden ?Timing:  Constant ?Progression:  Waxing and waning ?Chronicity:  New ?Relieved by:  Nothing ?Worsened by:  Nothing ?Ineffective treatments:  None tried ?Associated symptoms: no abdominal pain, no back pain, no cough, no fatigue and no headache   ? ?  ? ?Home Medications ?Prior to Admission medications   ?Medication Sig Start Date End Date Taking? Authorizing Provider  ?acetaminophen (TYLENOL) 325 MG tablet Take 2 tablets (650 mg total) by mouth every 4 (four) hours as needed for mild pain (or temp > 37.5 C (99.5 F)). 04/05/20  Yes Angiulli, Lavon Paganini, PA-C  ?atorvastatin (LIPITOR) 10 MG tablet Take 1 tablet (10 mg total) by mouth daily at 6 PM. ?Patient taking differently: Take 10 mg by mouth daily. 04/05/20  Yes Angiulli, Lavon Paganini, PA-C  ?Cholecalciferol (VITAMIN D) 50 MCG (2000 UT) CAPS Take 1 capsule (2,000 Units total) by mouth daily. 04/05/20  Yes Angiulli, Lavon Paganini, PA-C  ?clopidogrel (PLAVIX) 75 MG tablet Take 75 mg by mouth daily.   Yes [provider]  ?glimepiride (AMARYL) 1 MG tablet Take 1 tablet (1 mg total) by mouth daily with breakfast. 04/06/20  Yes Angiulli, Lavon Paganini, PA-C  ?latanoprost (XALATAN) 0.005 % ophthalmic solution Place 1 drop into both eyes at bedtime. 04/05/20  Yes Angiulli, Lavon Paganini, PA-C  ?Magnesium 250 MG TABS Take 1 tablet by mouth daily.   Yes [provider]  ?Multiple Vitamins-Minerals (ONE-A-DAY 50 PLUS PO) Take by mouth.   Yes [provider]  ?pantoprazole (PROTONIX) 40 MG tablet Take 40 mg by mouth daily.   Yes [provider]  ?tamsulosin (FLOMAX) 0.4 MG CAPS capsule Take 1 capsule (0.4 mg total) by mouth daily. 04/05/20  Yes Angiulli, Lavon Paganini, PA-C  ?loratadine (CLARITIN) 10 MG tablet Take 1 tablet (10 mg total) by mouth daily. ?Patient not taking: Reported on 04/17/2022 04/06/20   Cathlyn Parsons, PA-C  ?omeprazole (PRILOSEC) 40 MG capsule Take 1 capsule (40 mg total) by mouth daily. ?Patient not taking: Reported on 04/17/2022 04/05/20   Cathlyn Parsons, PA-C  ?vitamin B-12 (CYANOCOBALAMIN) 1000 MCG tablet Take 1 tablet (1,000 mcg total) by mouth daily. ?Patient not taking: Reported on 04/17/2022 04/05/20   Cathlyn Parsons, PA-C  ?   ? ?Allergies    ?Sulfa antibiotics   ? ?Review of Systems   ?Review of Systems  ?Constitutional:  Negative for appetite change and fatigue.  ?HENT:  Negative for congestion, ear discharge and sinus pressure.   ?Eyes:  Negative for discharge.  ?Respiratory:  Negative for cough.   ?Cardiovascular:  Positive for chest pain.  ?Gastrointestinal:  Negative for abdominal pain and diarrhea.  ?Genitourinary:  Negative for frequency and hematuria.  ?Musculoskeletal:  Negative for back pain.  ?Skin:  Negative for rash.  ?Neurological:  Negative for seizures and  headaches.  ?Psychiatric/Behavioral:  Negative for hallucinations.   ? ?Physical Exam ?Updated Vital Signs ?BP (!) 147/73   Pulse 70   Temp 97.9 ?F (36.6 ?C) (Oral)   Resp 14   Ht 5\' 8"  (1.727 m)   Wt 61.2 kg   SpO2 96%   BMI 20.53 kg/m?  ?Physical Exam ?Vitals and nursing note reviewed.  ?Constitutional:   ?   Appearance: He is well-developed.  ?HENT:  ?   Head: Normocephalic.  ?   Nose: Nose normal.  ?Eyes:  ?   General: No scleral icterus. ?   Conjunctiva/sclera: Conjunctivae normal.  ?Neck:  ?   Thyroid: No thyromegaly.  ?Cardiovascular:  ?    Rate and Rhythm: Normal rate and regular rhythm.  ?   Heart sounds: No murmur heard. ?  No friction rub. No gallop.  ?Pulmonary:  ?   Breath sounds: No stridor. No wheezing or rales.  ?Chest:  ?   Chest wall: No tenderness.  ?Abdominal:  ?   General: There is no distension.  ?   Tenderness: There is no abdominal tenderness. There is no rebound.  ?Musculoskeletal:     ?   General: Normal range of motion.  ?   Cervical back: Neck supple.  ?Lymphadenopathy:  ?   Cervical: No cervical adenopathy.  ?Skin: ?   Findings: No erythema or rash.  ?Neurological:  ?   Mental Status: He is alert and oriented to person, place, and time.  ?   Motor: No abnormal muscle tone.  ?   Coordination: Coordination normal.  ?Psychiatric:     ?   Behavior: Behavior normal.  ? ? ?ED Results / Procedures / Treatments   ?Labs ?(all labs ordered are listed, but only abnormal results are displayed) ?Labs Reviewed  ?BASIC METABOLIC PANEL - Abnormal; Notable for the following components:  ?    Result Value  ? Glucose, Bld 202 (*)   ? BUN 34 (*)   ? Creatinine, Ser 1.85 (*)   ? GFR, Estimated 35 (*)   ? All other components within normal limits  ?CBC - Abnormal; Notable for the following components:  ? RBC 3.68 (*)   ? Hemoglobin 12.0 (*)   ? HCT 37.1 (*)   ? MCV 100.8 (*)   ? All other components within normal limits  ?HEPATIC FUNCTION PANEL - Abnormal; Notable for the following components:  ? Alkaline Phosphatase 140 (*)   ? All other components within normal limits  ?RESP PANEL BY RT-PCR (FLU A&B, COVID) [id number]  ?LIPASE, BLOOD  ?BRAIN NATRIURETIC PEPTIDE  ?TROPONIN I (HIGH SENSITIVITY)  ?TROPONIN I (HIGH SENSITIVITY)  ? ? ?EKG ?EKG Interpretation ? ?Date/Time:  Tuesday April 17 2022 20:33:29 EDT ?Ventricular Rate:  90 ?PR Interval:  140 ?QRS Duration: 92 ?QT Interval:  356 ?QTC Calculation: 435 ?R Axis:   14 ?Text Interpretation: Normal sinus rhythm Minimal voltage criteria for LVH, may be normal variant ( R in aVL ) Borderline ECG When compared  with ECG of 22-Mar-2020 10:12, PREVIOUS ECG IS PRESENT Confirmed by Martin Frazier [id number]) on 04/17/2022 8:49:03 PM ? ?Radiology ?DG Chest Portable 1 View ? ?Result Date: 04/17/2022 ?CLINICAL DATA:  Chest pain EXAM: PORTABLE CHEST 1 VIEW COMPARISON:  03/22/2020 FINDINGS: Mild cardiomegaly. Aortic atherosclerosis. No confluent opacities, effusions or edema. No acute bony abnormality. IMPRESSION: Mild cardiomegaly.  No active disease. Electronically Signed   By: Rolm Baptise M.D.   On: 04/17/2022 21:15   ? ?Procedures ?Procedures  ? ? ?  Medications Ordered in ED ?Medications  ?nitroGLYCERIN (NITROGLYN) 2 % ointment 1 inch (1 inch Topical Given 04/17/22 2118)  ?pantoprazole (PROTONIX) injection 40 mg (40 mg Intravenous Given 04/17/22 2117)  ? ? ?ED Course/ Medical Decision Making/ A&P ?CRITICAL CARE ?Performed by: Martin Frazier ?Total critical care time: 40 minutes ?Critical care time was exclusive of separately billable procedures and treating other patients. ?Critical care was necessary to treat or prevent imminent or life-threatening deterioration. ?Critical care was time spent personally by me on the following activities: development of treatment plan with patient and/or surrogate as well as nursing, discussions with consultants, evaluation of patient's response to treatment, examination of patient, obtaining history from patient or surrogate, ordering and performing treatments and interventions, ordering and review of laboratory studies, ordering and review of radiographic studies, pulse oximetry and re-evaluation of patient's condition. ? ? ? ? ? ?Patient with exertional chest pain.  And shortness of breath.  Chest x-ray shows cardiomegaly ?                        ?Medical Decision Making ?Amount and/or Complexity of Data Reviewed ?Labs: ordered. ?Radiology: ordered. ? ?Risk ?Prescription drug management. ?Decision regarding hospitalization. ? ?This patient presents to the ED for concern of chest pain and shortness of  breath,, this involves an extensive number of treatment options, and is a complaint that carries with it a high risk of complications and morbidity.  The differential diagnosis includes MI, pneumonia ? ? ?Co morb

## 2022-04-17 NOTE — ED Triage Notes (Signed)
C/o intermittent chest pain that radiates down left arm, Pt states he has this kind of pain in his left arm frequently. Pt took tums at home and the chest pain stopped. Pt stated he wanted to be checked out because he pain was worse than usual. ? ? ?Hx of stroke, left sided weakness and numbness at baseline.  ?

## 2022-04-18 ENCOUNTER — Other Ambulatory Visit (HOSPITAL_COMMUNITY): Payer: Medicare Other

## 2022-04-18 ENCOUNTER — Other Ambulatory Visit (HOSPITAL_COMMUNITY): Payer: Self-pay | Admitting: *Deleted

## 2022-04-18 ENCOUNTER — Observation Stay (HOSPITAL_BASED_OUTPATIENT_CLINIC_OR_DEPARTMENT_OTHER): Payer: Medicare Other

## 2022-04-18 ENCOUNTER — Encounter (HOSPITAL_COMMUNITY): Payer: Self-pay | Admitting: Family Medicine

## 2022-04-18 DIAGNOSIS — I10 Essential (primary) hypertension: Secondary | ICD-10-CM | POA: Diagnosis not present

## 2022-04-18 DIAGNOSIS — E119 Type 2 diabetes mellitus without complications: Secondary | ICD-10-CM

## 2022-04-18 DIAGNOSIS — Z8673 Personal history of transient ischemic attack (TIA), and cerebral infarction without residual deficits: Secondary | ICD-10-CM

## 2022-04-18 DIAGNOSIS — K2100 Gastro-esophageal reflux disease with esophagitis, without bleeding: Secondary | ICD-10-CM | POA: Diagnosis not present

## 2022-04-18 DIAGNOSIS — E785 Hyperlipidemia, unspecified: Secondary | ICD-10-CM | POA: Diagnosis not present

## 2022-04-18 DIAGNOSIS — N183 Chronic kidney disease, stage 3 unspecified: Secondary | ICD-10-CM | POA: Diagnosis not present

## 2022-04-18 DIAGNOSIS — I428 Other cardiomyopathies: Secondary | ICD-10-CM | POA: Diagnosis not present

## 2022-04-18 DIAGNOSIS — E1169 Type 2 diabetes mellitus with other specified complication: Secondary | ICD-10-CM

## 2022-04-18 DIAGNOSIS — R079 Chest pain, unspecified: Secondary | ICD-10-CM | POA: Diagnosis not present

## 2022-04-18 DIAGNOSIS — I169 Hypertensive crisis, unspecified: Secondary | ICD-10-CM | POA: Insufficient documentation

## 2022-04-18 LAB — GLUCOSE, CAPILLARY
Glucose-Capillary: 99 mg/dL (ref 70–99)
Glucose-Capillary: 163 mg/dL — ABNORMAL HIGH (ref 70–99)
Glucose-Capillary: 117 mg/dL — ABNORMAL HIGH (ref 70–99)
Glucose-Capillary: 143 mg/dL — ABNORMAL HIGH (ref 70–99)

## 2022-04-18 LAB — BASIC METABOLIC PANEL
Anion gap: 7 (ref 5–15)
BUN: 34 mg/dL — ABNORMAL HIGH (ref 8–23)
CO2: 25 mmol/L (ref 22–32)
Calcium: 8.9 mg/dL (ref 8.9–10.3)
Chloride: 109 mmol/L (ref 98–111)
Creatinine, Ser: 1.7 mg/dL — ABNORMAL HIGH (ref 0.61–1.24)
GFR, Estimated: 39 mL/min — ABNORMAL LOW (ref >60)
Glucose, Bld: 86 mg/dL (ref 70–99)
Potassium: 4 mmol/L (ref 3.5–5.1)
Sodium: 141 mmol/L (ref 135–145)

## 2022-04-18 LAB — ECHOCARDIOGRAM COMPLETE
AR max vel: 2.89 cm2
AV Area VTI: 3.11 cm2
AV Area mean vel: 2.5 cm2
AV Mean grad: 2 mmHg
AV Peak grad: 3.6 mmHg
Ao pk vel: 0.95 m/s
Area-P 1/2: 3.91 cm2
Calc EF: 57.4 %
Height: 68 in
MV VTI: 2.76 cm2
S' Lateral: 3.5 cm
Single Plane A2C EF: 45.5 %
Single Plane A4C EF: 67.4 %
Weight: 2100.8 oz

## 2022-04-18 LAB — TROPONIN I (HIGH SENSITIVITY)
Troponin I (High Sensitivity): 28 ng/L — ABNORMAL HIGH (ref <18)
Troponin I (High Sensitivity): 46 ng/L — ABNORMAL HIGH (ref <18)
Troponin I (High Sensitivity): 27 ng/L — ABNORMAL HIGH (ref <18)

## 2022-04-18 LAB — HEMOGLOBIN A1C
Hgb A1c MFr Bld: 6.3 % — ABNORMAL HIGH (ref 4.8–5.6)
Mean Plasma Glucose: 134.11 mg/dL

## 2022-04-18 LAB — BRAIN NATRIURETIC PEPTIDE: B Natriuretic Peptide: 177 pg/mL — ABNORMAL HIGH (ref 0.0–100.0)

## 2022-04-18 MED ORDER — TAMSULOSIN HCL 0.4 MG PO CAPS
0.4000 mg | ORAL_CAPSULE | Freq: Every day | ORAL | Status: DC
  Administered 2022-04-18 – 2022-04-20 (×3): 0.4 mg via ORAL
  Filled 2022-04-18 (×3): qty 1

## 2022-04-18 MED ORDER — ONDANSETRON HCL 4 MG/2ML IJ SOLN: 4.0000 mg | Freq: Four times a day (QID) | INTRAMUSCULAR | Status: DC | PRN

## 2022-04-18 MED ORDER — INSULIN ASPART 100 UNIT/ML IJ SOLN
0.0000 [IU] | Freq: Three times a day (TID) | INTRAMUSCULAR | Status: DC
  Administered 2022-04-18: 3 [IU] via SUBCUTANEOUS
  Administered 2022-04-19: 2 [IU] via SUBCUTANEOUS
  Administered 2022-04-19: 3 [IU] via SUBCUTANEOUS
  Administered 2022-04-20: 2 [IU] via SUBCUTANEOUS

## 2022-04-18 MED ORDER — ATORVASTATIN CALCIUM 10 MG PO TABS
10.0000 mg | ORAL_TABLET | Freq: Every day | ORAL | Status: DC
  Administered 2022-04-18 – 2022-04-20 (×3): 10 mg via ORAL
  Filled 2022-04-18 (×3): qty 1

## 2022-04-18 MED ORDER — CLOPIDOGREL BISULFATE 75 MG PO TABS
75.0000 mg | ORAL_TABLET | Freq: Every day | ORAL | Status: DC
  Administered 2022-04-18 – 2022-04-21 (×4): 75 mg via ORAL
  Filled 2022-04-18 (×4): qty 1

## 2022-04-18 MED ORDER — ENSURE ENLIVE PO LIQD
237.0000 mL | Freq: Two times a day (BID) | ORAL | Status: DC
  Administered 2022-04-18 – 2022-04-21 (×4): 237 mL via ORAL

## 2022-04-18 MED ORDER — OXYCODONE HCL 5 MG PO TABS: 5.0000 mg | ORAL_TABLET | Freq: Four times a day (QID) | ORAL | Status: DC | PRN

## 2022-04-18 MED ORDER — HEPARIN SODIUM (PORCINE) 5000 UNIT/ML IJ SOLN
5000.0000 [IU] | Freq: Three times a day (TID) | INTRAMUSCULAR | Status: DC
  Administered 2022-04-18 – 2022-04-21 (×9): 5000 [IU] via SUBCUTANEOUS
  Filled 2022-04-18 (×9): qty 1

## 2022-04-18 MED ORDER — PANTOPRAZOLE SODIUM 40 MG PO TBEC
40.0000 mg | DELAYED_RELEASE_TABLET | Freq: Every day | ORAL | Status: DC
  Administered 2022-04-18 – 2022-04-21 (×4): 40 mg via ORAL
  Filled 2022-04-18 (×4): qty 1

## 2022-04-18 MED ORDER — INSULIN ASPART 100 UNIT/ML IJ SOLN: 0.0000 [IU] | Freq: Every day | INTRAMUSCULAR | Status: DC

## 2022-04-18 MED ORDER — VITAMIN B-12 1000 MCG PO TABS
1000.0000 ug | ORAL_TABLET | Freq: Every day | ORAL | Status: DC
  Administered 2022-04-18 – 2022-04-21 (×4): 1000 ug via ORAL
  Filled 2022-04-18 (×4): qty 1

## 2022-04-18 MED ORDER — ACETAMINOPHEN 325 MG PO TABS
650.0000 mg | ORAL_TABLET | ORAL | Status: DC | PRN
  Administered 2022-04-19: 650 mg via ORAL
  Filled 2022-04-18: qty 2

## 2022-04-18 NOTE — Assessment & Plan Note (Signed)
-   Hold glimepiride ?-Sliding scale coverage ?-Hemoglobin A1c in the a.m. ?-Monitor CBGs ?

## 2022-04-18 NOTE — Consult Note (Addendum)
?Cardiology Consultation:  ? ?Patient ID: Martin Frazier. ?MRN: [id number]; DOB: 1934/07/30 ? ?Admit date: 04/17/2022 ?Date of Consult: 04/18/2022 ? ?PCP:  Leeanne Rio, MD ?  ?[facility] Providers ?Cardiologist:  New to [facility] - Dr. Harl Bowie ? ?Patient Profile:  ? ?Martin Frazier. is a 88 y.o. male with past medical history of HLD, Type 2 DM, GERD, Stage 3 CKD, prior tobacco use and prior CVA (right thalamic infarction in 02/2020) who is being seen 04/18/2022 for the evaluation of chest pain at the request of Dr. Clearence Ped. ? ?History of Present Illness:  ? ?[name] presented to [facility] ED on 04/17/2022 for evaluation of chest pain occurring the day of admission. In talking with the patient today, he reports having acid reflux for 70+ years. He is on PPI therapy and uses TUMS as needed and reports consuming vinegar and water after meals to help with digestion. Previously followed by Dr. Laural Golden and reports he required esophageal dilation in the past. Most episodes occur after food consumption and he describes a burning pain along his epigastric region. Says he is not active at baseline since his CVA but has experienced occasional burning pain when walking to his mailbox as well. Reports occasional numbness/tingling down his left arm and this has occurred since his CVA. He denies any specific dyspnea on exertion, orthopnea, PND or pitting edema. Uses a cane to assist with ambulation.  ? ?No known personal history of CAD, CHF or cardiac arrhythmias. Was told he had a murmur as a child but has not been told about a murmur recently. Reports his father did pass away from an MI at 45 yo.  ? ?Initial labs showing WBC 6.1, Hgb 12.0, platelets 184, Na+ 142, K+ 4.3, Na+ 142, K+ 4.3 and creatinine 1.85 (baseline 1.5 - 1.6). Negative for COVID and Influenza. Initial Hs Troponin 15 with repeat values of 28 and 46. CXR showing mild cardiomegaly with no active disease. EKG shows NSR, HR 90 with slight ST depression along  Lead III which is similar to prior tracings.  ? ?Repeat labs this AM show his creatinine has slightly improved to 1.70. ? ? ?Past Medical History:  ?Diagnosis Date  ? Collapsed lung   ? left  ? Diabetes mellitus without complication ([place])   ? over 10 yrs  ? GERD (gastroesophageal reflux disease)   ? History of CVA (cerebrovascular accident)   ? Hypertension   ? Kidney stones   ? ? ?Past Surgical History:  ?Procedure Laterality Date  ? APPENDECTOMY    ? BIOPSY  04/01/2019  ? Procedure: BIOPSY;  Surgeon: Rogene Houston, MD;  Location: [facility];  Service: Endoscopy;;  gastric  ? CYSTOSCOPY W/ URETERAL STENT PLACEMENT Right 04/03/2019  ? Procedure: CYSTOSCOPY WITH RETROGRADE PYELOGRAM/URETERAL STENT PLACEMENT;  Surgeon: Franchot Gallo, MD;  Location: [facility];  Service: Urology;  Laterality: Right;  ? ESOPHAGOGASTRODUODENOSCOPY N/A 04/01/2019  ? Procedure: ESOPHAGOGASTRODUODENOSCOPY (EGD);  Surgeon: Rogene Houston, MD;  Location: [facility];  Service: Endoscopy;  Laterality: N/A;  10:55am  ? EXTRACORPOREAL SHOCK WAVE LITHOTRIPSY Right 04/16/2019  ? Procedure: EXTRACORPOREAL SHOCK WAVE LITHOTRIPSY (ESWL);  Surgeon: Festus Aloe, MD;  Location: [facility] OR[facility];  Service: Urology;  Laterality: Right;  ? HEMORRHOID SURGERY    ? KIDNEY STONE SURGERY    ? TONSILLECTOMY    ?  ? ?Home Medications:  ?Prior to Admission medications   ?Medication Sig Start Date End Date Taking? Authorizing Provider  ?acetaminophen (TYLENOL) 325  MG tablet Take 2 tablets (650 mg total) by mouth every 4 (four) hours as needed for mild pain (or temp > 37.5 C (99.5 F)). 04/05/20  Yes Angiulli, Lavon Paganini, PA-C  ?atorvastatin (LIPITOR) 10 MG tablet Take 1 tablet (10 mg total) by mouth daily at 6 PM. ?Patient taking differently: Take 10 mg by mouth daily. 04/05/20  Yes Angiulli, Lavon Paganini, PA-C  ?Cholecalciferol (VITAMIN D) 50 MCG (2000 UT) CAPS Take 1 capsule (2,000 Units total) by mouth daily. 04/05/20  Yes Angiulli, Lavon Paganini, PA-C  ?clopidogrel (PLAVIX) 75  MG tablet Take 75 mg by mouth daily.   Yes [provider]  ?glimepiride (AMARYL) 1 MG tablet Take 1 tablet (1 mg total) by mouth daily with breakfast. 04/06/20  Yes Angiulli, Lavon Paganini, PA-C  ?latanoprost (XALATAN) 0.005 % ophthalmic solution Place 1 drop into both eyes at bedtime. 04/05/20  Yes Angiulli, Lavon Paganini, PA-C  ?Magnesium 250 MG TABS Take 1 tablet by mouth daily.   Yes [provider]  ?Multiple Vitamins-Minerals (ONE-A-DAY 50 PLUS PO) Take by mouth.   Yes [provider]  ?pantoprazole (PROTONIX) 40 MG tablet Take 40 mg by mouth daily.   Yes [provider]  ?tamsulosin (FLOMAX) 0.4 MG CAPS capsule Take 1 capsule (0.4 mg total) by mouth daily. 04/05/20  Yes Angiulli, Lavon Paganini, PA-C  ?loratadine (CLARITIN) 10 MG tablet Take 1 tablet (10 mg total) by mouth daily. ?Patient not taking: Reported on 04/17/2022 04/06/20   Cathlyn Parsons, PA-C  ?omeprazole (PRILOSEC) 40 MG capsule Take 1 capsule (40 mg total) by mouth daily. ?Patient not taking: Reported on 04/17/2022 04/05/20   Cathlyn Parsons, PA-C  ?vitamin B-12 (CYANOCOBALAMIN) 1000 MCG tablet Take 1 tablet (1,000 mcg total) by mouth daily. ?Patient not taking: Reported on 04/17/2022 04/05/20   Cathlyn Parsons, PA-C  ? ? ?Inpatient Medications: ?Scheduled Meds: ? atorvastatin  10 mg Oral Daily  ? clopidogrel  75 mg Oral Daily  ? feeding supplement  237 mL Oral BID BM  ? heparin  5,000 Units Subcutaneous Q8H  ? insulin aspart  0-15 Units Subcutaneous TID WC  ? insulin aspart  0-5 Units Subcutaneous QHS  ? pantoprazole  40 mg Oral Daily  ? tamsulosin  0.4 mg Oral Daily  ? vitamin B-12  1,000 mcg Oral Daily  ? ?Continuous Infusions: ? ?PRN Meds: ?acetaminophen, ondansetron (ZOFRAN) IV, oxyCODONE ? ?Allergies:    ?Allergies  ?Allergen Reactions  ? Sulfa Antibiotics Nausea And Vomiting  ? ? ?Social History:   ?Social History  ? ?Socioeconomic History  ? Marital status: Married  ?  Spouse name: Not on file  ? Number of children: Not  on file  ? Years of education: Not on file  ? Highest education level: Not on file  ?Occupational History  ? Not on file  ?Tobacco Use  ? Smoking status: Former  ?  Types: Cigarettes  ? Smokeless tobacco: Never  ? Tobacco comments:  ?  quit 2006  ?Vaping Use  ? Vaping Use: Never used  ?Substance and Sexual Activity  ? Alcohol use: No  ? Drug use: No  ? Sexual activity: Not Currently  ?Other Topics Concern  ? Not on file  ?Social History Narrative  ? Not on file  ? ?Social Determinants of Health  ? ?Financial Resource Strain: Not on file  ?Food Insecurity: Not on file  ?Transportation Needs: Not on file  ?Physical Activity: Not on file  ?Stress: Not on file  ?  Social Connections: Not on file  ?Intimate Partner Violence: Not on file  ?  ?Family History:   ? ?Family History  ?Problem Relation Age of Onset  ? Heart attack Father   ?  ? ?ROS:  ?Please see the history of present illness.  ? ?All other ROS reviewed and negative.    ? ?Physical Exam/Data:  ? ?Vitals:  ? 04/17/22 2330 04/18/22 0030 04/18/22 0110 04/18/22 0529  ?BP: 124/69 (!) 96/59 (!) 143/60 122/72  ?Pulse: 76 72 64 74  ?Resp: 18 12 18 20   ?Temp:   97.7 ?F (36.5 ?C) 98 ?F (36.7 ?C)  ?TempSrc:   Oral   ?SpO2: 97% 96% 98% 100%  ?Weight:   59.6 kg   ?Height:   5\' 8"  (1.727 m)   ? ?No intake or output data in the 24 hours ending 04/18/22 0905 ? ?  04/18/2022  ?  1:10 AM 04/17/2022  ?  8:39 PM 02/28/2021  ?  9:42 AM  ?Last 3 Weights  ?Weight (lbs) 131 lb 4.8 oz 135 lb 139 lb 9.6 oz  ?Weight (kg) 59.557 kg 61.236 kg 63.322 kg  ?   ?Body mass index is 19.96 kg/m?.  ?General: Pleasant elderly male appearing in no acute distress. Hard of hearing even with hearing aids in place. ?HEENT: normal ?Neck: no JVD ?Vascular: No carotid bruits; Distal pulses 2+ bilaterally ?Cardiac:  normal S1, S2; RRR; no murmur  ?Lungs:  clear to auscultation bilaterally, no wheezing, rhonchi or rales  ?Abd: soft, nontender, no hepatomegaly  ?Ext: no pitting edema ?Musculoskeletal:  No  deformities, BUE and BLE strength normal and equal ?Skin: warm and dry  ?Neuro:  CNs 2-12 intact, no focal abnormalities noted ?Psych:  Normal affect  ? ?EKG:  The EKG was personally reviewed and demonstrates:

## 2022-04-18 NOTE — Progress Notes (Signed)
?  PROGRESS NOTE ? ?Patient admitted earlier this morning. See H&P.  ? ?Martin Frazier. is a 88 y.o. male with medical history significant of hypertension, history of stroke, GERD, diabetes mellitus type 2, and more presents ED with a chief complaint of chest pain.  He describes chest pain that starts after eating, pain has been intermittent and chronic for about 2 months. ? ?Troponin has trended upward slightly and 15, 28, 46. Repeat lab this afternoon and tomorrow AM. ?Creatinine remains stable around his baseline of 1.6 ?Cardiology following, echocardiogram is pending, may possibly pursue ischemic work up in hospital  ? ? ?Status is: Observation ?The patient will require care spanning > 2 midnights and should be moved to inpatient because: CP work up pending  ? ? ?Noralee Stain, DO ?[facility] ?04/18/2022, 12:42 PM ? ?Available via Epic secure chat 7am-7pm ?After these hours, please refer to coverage provider listed on [email or web address]  ? ?

## 2022-04-18 NOTE — Assessment & Plan Note (Signed)
-   Not currently requiring pharmaceutical control ?-BP well controlled at presentation 147/73 ?-Continue to monitor ?

## 2022-04-18 NOTE — H&P (Signed)
?History and Physical  ? ? ?Patient: Martin Frazier. [id number] DOB: 1934-08-15 ?DOA: 04/17/2022 ?DOS: the patient was seen and examined on 04/18/2022 ?PCP: Leeanne Rio, MD  ?Patient coming from: Home ? ?Chief Complaint:  ?Chief Complaint  ?Patient presents with  ? Chest Pain  ? ?HPI: Martin Frazier. is a 88 y.o. male with medical history significant of hypertension, history of stroke, GERD, diabetes mellitus type 2, and more presents ED with a chief complaint of chest pain radiating to left arm and hand.  Patient reports the pain started about 11 PM last night.  He is pretty sure it is indigestion.  He said he had just had dinner and was watching the news.  The pain starts just to the right of the sternum and radiates across his chest to the left and down his arm.  Left hand has numbness associated.  This has been going on intermittently for 2 months.  Patient has been drinking vinegar and water, which usually helps.  When that does not help he takes Tums.  That usually helps as well. The pain has been getting more and more intense.  It lasts for about 25-30 minutes.  He notes that its exertional, and can predictably happen when he is walking to his mailbox.  Rest makes it go away, but he is convinced that vinegar makes it go away as well.  He denies dyspnea, palpitations, nausea.  He admits to dizziness and weakness that are at least concurrent if not associated.  Patient is convinced that this pain comes from his diaphragm rubbing against his esophagus when he has a history of GERD.  He notes that he did have esophagitis in the past.  Patient has no other complaints at this time. ? ?Patient does not smoke, does not drink.  He is vaccinated for COVID.  Patient is very hard of hearing and reports that he is told so there is some things he just cannot hear me say.  For this reason I did not cover CODE STATUS because they would not want to cover something if he cannot hear the entire discussion. ?Review of  Systems: As mentioned in the history of present illness. All other systems reviewed and are negative. ?Past Medical History:  ?Diagnosis Date  ? Collapsed lung   ? left  ? Diabetes mellitus without complication ([place])   ? over 10 yrs  ? GERD (gastroesophageal reflux disease)   ? Hypertension   ? Kidney stones   ? ?Past Surgical History:  ?Procedure Laterality Date  ? APPENDECTOMY    ? BIOPSY  04/01/2019  ? Procedure: BIOPSY;  Surgeon: Rogene Houston, MD;  Location: [facility];  Service: Endoscopy;;  gastric  ? CYSTOSCOPY W/ URETERAL STENT PLACEMENT Right 04/03/2019  ? Procedure: CYSTOSCOPY WITH RETROGRADE PYELOGRAM/URETERAL STENT PLACEMENT;  Surgeon: Franchot Gallo, MD;  Location: [facility];  Service: Urology;  Laterality: Right;  ? ESOPHAGOGASTRODUODENOSCOPY N/A 04/01/2019  ? Procedure: ESOPHAGOGASTRODUODENOSCOPY (EGD);  Surgeon: Rogene Houston, MD;  Location: [facility];  Service: Endoscopy;  Laterality: N/A;  10:55am  ? EXTRACORPOREAL SHOCK WAVE LITHOTRIPSY Right 04/16/2019  ? Procedure: EXTRACORPOREAL SHOCK WAVE LITHOTRIPSY (ESWL);  Surgeon: Festus Aloe, MD;  Location: [facility];  Service: Urology;  Laterality: Right;  ? HEMORRHOID SURGERY    ? KIDNEY STONE SURGERY    ? TONSILLECTOMY    ? ?Social History:  reports that he has quit smoking. His smoking use included cigarettes. He has never used smokeless tobacco.  He reports that he does not drink alcohol and does not use drugs. ? ?Allergies  ?Allergen Reactions  ? Sulfa Antibiotics Nausea And Vomiting  ? ? ?No family history on file. ? ?Prior to Admission medications   ?Medication Sig Start Date End Date Taking? Authorizing Provider  ?acetaminophen (TYLENOL) 325 MG tablet Take 2 tablets (650 mg total) by mouth every 4 (four) hours as needed for mild pain (or temp > 37.5 C (99.5 F)). 04/05/20  Yes Angiulli, Lavon Paganini, PA-C  ?atorvastatin (LIPITOR) 10 MG tablet Take 1 tablet (10 mg total) by mouth daily at 6 PM. ?Patient taking differently: Take 10 mg by mouth  daily. 04/05/20  Yes Angiulli, Lavon Paganini, PA-C  ?Cholecalciferol (VITAMIN D) 50 MCG (2000 UT) CAPS Take 1 capsule (2,000 Units total) by mouth daily. 04/05/20  Yes Angiulli, Lavon Paganini, PA-C  ?clopidogrel (PLAVIX) 75 MG tablet Take 75 mg by mouth daily.   Yes [provider]  ?glimepiride (AMARYL) 1 MG tablet Take 1 tablet (1 mg total) by mouth daily with breakfast. 04/06/20  Yes Angiulli, Lavon Paganini, PA-C  ?latanoprost (XALATAN) 0.005 % ophthalmic solution Place 1 drop into both eyes at bedtime. 04/05/20  Yes Angiulli, Lavon Paganini, PA-C  ?Magnesium 250 MG TABS Take 1 tablet by mouth daily.   Yes [provider]  ?Multiple Vitamins-Minerals (ONE-A-DAY 50 PLUS PO) Take by mouth.   Yes [provider]  ?pantoprazole (PROTONIX) 40 MG tablet Take 40 mg by mouth daily.   Yes [provider]  ?tamsulosin (FLOMAX) 0.4 MG CAPS capsule Take 1 capsule (0.4 mg total) by mouth daily. 04/05/20  Yes Angiulli, Lavon Paganini, PA-C  ?loratadine (CLARITIN) 10 MG tablet Take 1 tablet (10 mg total) by mouth daily. ?Patient not taking: Reported on 04/17/2022 04/06/20   Cathlyn Parsons, PA-C  ?omeprazole (PRILOSEC) 40 MG capsule Take 1 capsule (40 mg total) by mouth daily. ?Patient not taking: Reported on 04/17/2022 04/05/20   Cathlyn Parsons, PA-C  ?vitamin B-12 (CYANOCOBALAMIN) 1000 MCG tablet Take 1 tablet (1,000 mcg total) by mouth daily. ?Patient not taking: Reported on 04/17/2022 04/05/20   Cathlyn Parsons, PA-C  ? ? ?Physical Exam: ?Vitals:  ? 04/17/22 2235 04/17/22 2330 04/18/22 0030 04/18/22 0110  ?BP: (!) 147/73 124/69 (!) 96/59 (!) 143/60  ?Pulse: 70 76 72 64  ?Resp: 14 18 12 18   ?Temp:    97.7 ?F (36.5 ?C)  ?TempSrc:    Oral  ?SpO2: 96% 97% 96% 98%  ?Weight:    59.6 kg  ?Height:    5\' 8"  (1.727 m)  ? ?1.  General: ?Patient lying supine in bed,  no acute distress ?  ?2. Psychiatric: ?Alert and oriented x 3, mood and behavior normal for situation, pleasant and cooperative with exam ?  ?3. Neurologic: ?Speech is at  baseline and language is normal, face is symmetric, moves all 4 extremities voluntarily, at baseline without acute deficits on limited exam ?  ?4. HEENMT:  ?Head is atraumatic, normocephalic, pupils reactive to light, neck is supple, trachea is midline, mucous membranes are moist ?  ?5. Respiratory : ?Lungs are clear to auscultation bilaterally without wheezing, rhonchi, rales, no cyanosis, no increase in work of breathing or accessory muscle use ?  ?6. Cardiovascular : ?Heart rate normal, rhythm is regular, no murmurs, rubs or gallops, no peripheral edema, peripheral pulses palpated ?  ?7. Gastrointestinal:  ?Abdomen is soft, nondistended, nontender to palpation bowel sounds active, no masses or organomegaly palpated ?  ?  8. Skin:  ?Skin is warm, dry and intact without rashes, acute lesions, or ulcers on limited exam ?  ?9.Musculoskeletal:  ?No acute deformities or trauma, no asymmetry in tone, no peripheral edema, peripheral pulses palpated, no tenderness to palpation in the extremities ? ?Data Reviewed: ?In the ED ?Temp 97.9, heart rate 70-94, respiratory rate 14-22, blood pressure 147/73, satting at 96% ?No leukocytosis with a white blood cell count of 6.1, hemoglobin 12.0, platelets 184 ?Chemistry panel shows a BUN 34, creatinine 1.85, glucose 202 ?Chest x-ray shows cardiomegaly ?EKG shows a heart rate of 90, sinus rhythm, QTc 4 and 35 ?Nitropatch ordered in the ED ?There is a bump in creatinine from 1.64 in 20 21-1.85 today, patient does have history of CKD stage III, likely this is his new baseline or close to it ?Admission requested for chest pain work-up ? ?Assessment and Plan: ?* Chest pain ?- For cardiac pain ?-Monitor on telemetry ?-Initial troponin 15, repeat 28 ?-EKG shows a heart rate of 90, sinus rhythm, QTc 435 without acute changes ?-Chest x-ray shows cardiomegaly ?-Consult cardiology ?-Cycle troponin with a.m. labs ?-Patient is currently chest pain-free ?-Continue to monitor ? ?History of stroke ?-  With residual slurred speech ?-Continue Plavix and statin ?-No acute deficits on exam today ? ?Diabetes mellitus type 2 in nonobese [facility]) ?- Hold glimepiride ?-Sliding scale coverage ?-Hemoglobin A1c in

## 2022-04-18 NOTE — Assessment & Plan Note (Signed)
-   With residual slurred speech ?-Continue Plavix and statin ?-No acute deficits on exam today ?

## 2022-04-18 NOTE — Progress Notes (Signed)
Initial Nutrition Assessment ? ?DOCUMENTATION CODES:  ? ?  ? ?INTERVENTION:  ?Ensure Enlive po BID  ? ?Continue Ensure BID for at least 1 month after discharge.  ? ?NUTRITION DIAGNOSIS:  ? ?Inadequate oral intake related to chronic illness (indigestion per pt) as evidenced by per patient/family report, meal completion < 50%. ? ? ?GOAL:  ?Patient will meet greater than or equal to 90% of their needs ? ? ?MONITOR:  ?PO intake, Labs, Weight trends, Supplement acceptance ? ?REASON FOR ASSESSMENT:  ? ?Malnutrition Screening Tool ?  ? ?ASSESSMENT: Patient is an 88 yo male from home with wife. History includes- DM2, GERD, HTN and stroke. Presents with chest pain. Hard of hearing- wears hearing aid.  ? ?No family bedside. Per chart -Patient completing ~50% of meals. Complains of problem with indigestion limiting food intake at times. Also says that he has scoliosis which "compacts" his stomach. Independent with feeding. His wife prepares their meals. He is NPO currently for test.  ? ?Patient says he may discharge after test if cleared. Patient likes Ensure (if it cold). Encouraged him to continue to drink 2 Ensure daily between meals for at least 1 month. ? ?Limited weight history. Patient wt 08/25/20- 63.3 kg. Currently (131.12 lb) 59.6 kg. Suspect malnutrition but unable to clearly define at present. ? ?Medications: insulin, protonix, lipitor, Vit B-12.  ? ?Labs: ? ?  Latest Ref Rng & Units 04/19/2022  ?  4:51 AM 04/18/2022  ?  5:09 AM 04/17/2022  ?  8:53 PM  ?BMP  ?Glucose 70 - 99 mg/dL 503   86   546    ?BUN 8 - 23 mg/dL 33   34   34    ?Creatinine 0.61 - 1.24 mg/dL 5.68   1.27   5.17    ?Sodium 135 - 145 mmol/L 139   141   142    ?Potassium 3.5 - 5.1 mmol/L 3.7   4.0   4.3    ?Chloride 98 - 111 mmol/L 108   109   110    ?CO2 22 - 32 mmol/L 23   25   25     ?Calcium 8.9 - 10.3 mg/dL 9.0   8.9   9.1    ?   ? ?NUTRITION - FOCUSED PHYSICAL EXAM: ? ?Flowsheet Row Most Recent Value  ?Orbital Region Severe depletion  ?Upper Arm  Region Severe depletion  ?Thoracic and Lumbar Region Moderate depletion  ?Buccal Region Moderate depletion  ?Temple Region Moderate depletion  ?Clavicle Bone Region Severe depletion  ?Clavicle and Acromion Bone Region Moderate depletion  ?Dorsal Hand Moderate depletion  ?Edema (RD Assessment) Mild  ?Hair Reviewed  ?Eyes Reviewed  ?Mouth Reviewed  ?Skin Reviewed  ?Nails Reviewed  ? ?  ? ?Diet Order:   ?Diet Order   ? ?       ?  Diet NPO time specified  Diet effective midnight       ?  ? ?  ?  ? ?  ? ? ?EDUCATION NEEDS:  ?Education needs have been addressed ? ?Skin:  Skin Assessment: Reviewed RN Assessment ? ?Last BM:  4/19 ? ?Height:  ? ?Ht Readings from Last 1 Encounters:  ?04/18/22 5\' 8"  (1.727 m)  ? ? ?Weight:  ? ?Wt Readings from Last 1 Encounters:  ?04/18/22 59.6 kg  ? ? ?Ideal Body Weight:   64 kg  ? ?BMI:  Body mass index is 19.96 kg/m?. ? ?Estimated Nutritional Needs:  ? ?Kcal:  1900-2100 ? ?Protein:  80-90 gr ? ?Fluid:  2 liters daily ? ?Royann Shivers MS,RD,CSG,LDN ?Contact: AMION  ? ?

## 2022-04-18 NOTE — Assessment & Plan Note (Signed)
-   Continue Protonix - Continue to monitor  

## 2022-04-18 NOTE — Progress Notes (Signed)
*  PRELIMINARY RESULTS* ?Echocardiogram ?2D Echocardiogram has been performed. ? ?Martin Frazier ?04/18/2022, 12:29 PM ?

## 2022-04-18 NOTE — Assessment & Plan Note (Signed)
-   For cardiac pain ?-Monitor on telemetry ?-Initial troponin 15, repeat 28 ?-EKG shows a heart rate of 90, sinus rhythm, QTc 435 without acute changes ?-Chest x-ray shows cardiomegaly ?-Consult cardiology ?-Cycle troponin with a.m. labs ?-Patient is currently chest pain-free ?-Continue to monitor ?

## 2022-04-18 NOTE — Progress Notes (Signed)
Pt has been up OOB to bathroom with one assist and FWW, tolerated well. Pt sat up in recliner for over an hour then back to bed for echocardiogram. Echocardiogram now completed, pt has had no complaints of chest pain so far this am. ?

## 2022-04-19 ENCOUNTER — Observation Stay (HOSPITAL_COMMUNITY): Payer: Medicare Other

## 2022-04-19 ENCOUNTER — Encounter (HOSPITAL_COMMUNITY): Payer: Self-pay | Admitting: Family Medicine

## 2022-04-19 DIAGNOSIS — I5022 Chronic systolic (congestive) heart failure: Secondary | ICD-10-CM

## 2022-04-19 DIAGNOSIS — R0789 Other chest pain: Secondary | ICD-10-CM

## 2022-04-19 DIAGNOSIS — E785 Hyperlipidemia, unspecified: Secondary | ICD-10-CM

## 2022-04-19 DIAGNOSIS — I208 Other forms of angina pectoris: Secondary | ICD-10-CM | POA: Diagnosis not present

## 2022-04-19 LAB — GLUCOSE, CAPILLARY
Glucose-Capillary: 98 mg/dL (ref 70–99)
Glucose-Capillary: 168 mg/dL — ABNORMAL HIGH (ref 70–99)
Glucose-Capillary: 131 mg/dL — ABNORMAL HIGH (ref 70–99)
Glucose-Capillary: 117 mg/dL — ABNORMAL HIGH (ref 70–99)

## 2022-04-19 LAB — BASIC METABOLIC PANEL
Anion gap: 8 (ref 5–15)
BUN: 33 mg/dL — ABNORMAL HIGH (ref 8–23)
CO2: 23 mmol/L (ref 22–32)
Calcium: 9 mg/dL (ref 8.9–10.3)
Chloride: 108 mmol/L (ref 98–111)
Creatinine, Ser: 1.64 mg/dL — ABNORMAL HIGH (ref 0.61–1.24)
GFR, Estimated: 40 mL/min — ABNORMAL LOW (ref >60)
Glucose, Bld: 108 mg/dL — ABNORMAL HIGH (ref 70–99)
Potassium: 3.7 mmol/L (ref 3.5–5.1)
Sodium: 139 mmol/L (ref 135–145)

## 2022-04-19 LAB — CBC
HCT: 35.2 % — ABNORMAL LOW (ref 39.0–52.0)
Hemoglobin: 11.5 g/dL — ABNORMAL LOW (ref 13.0–17.0)
MCH: 32.8 pg (ref 26.0–34.0)
MCHC: 32.7 g/dL (ref 30.0–36.0)
MCV: 100.3 fL — ABNORMAL HIGH (ref 80.0–100.0)
Platelets: 168 10*3/uL (ref 150–400)
RBC: 3.51 MIL/uL — ABNORMAL LOW (ref 4.22–5.81)
RDW: 13.3 % (ref 11.5–15.5)
WBC: 6.8 10*3/uL (ref 4.0–10.5)
nRBC: 0 % (ref 0.0–0.2)

## 2022-04-19 LAB — NM MYOCAR MULTI W/SPECT W/WALL MOTION / EF
LV dias vol: 87 mL (ref 62–150)
LV sys vol: 53 mL
Nuc Stress EF: 38 %
Peak HR: 103 {beats}/min
RATE: 0.3
Rest HR: 75 {beats}/min
Rest Nuclear Isotope Dose: 11 mCi
SDS: 6
SRS: 2
SSS: 8
ST Depression (mm): 0 mm
Stress Nuclear Isotope Dose: 33 mCi
TID: 1.25

## 2022-04-19 MED ORDER — TECHNETIUM TC 99M TETROFOSMIN IV KIT
30.0000 | PACK | Freq: Once | INTRAVENOUS | Status: AC | PRN
  Administered 2022-04-19: 33 via INTRAVENOUS

## 2022-04-19 MED ORDER — REGADENOSON 0.4 MG/5ML IV SOLN
INTRAVENOUS | Status: AC
  Administered 2022-04-19: 0.4 mg via INTRAVENOUS
  Filled 2022-04-19: qty 5

## 2022-04-19 MED ORDER — TECHNETIUM TC 99M TETROFOSMIN IV KIT
10.0000 | PACK | Freq: Once | INTRAVENOUS | Status: AC | PRN
  Administered 2022-04-19: 11 via INTRAVENOUS

## 2022-04-19 MED ORDER — SODIUM CHLORIDE FLUSH 0.9 % IV SOLN
INTRAVENOUS | Status: AC
  Administered 2022-04-19: 10 mL via INTRAVENOUS
  Filled 2022-04-19: qty 10

## 2022-04-19 MED ORDER — SODIUM CHLORIDE 0.9 % IV SOLN: INTRAVENOUS | Status: AC

## 2022-04-19 NOTE — Progress Notes (Addendum)
?PROGRESS NOTE ? ? ? ?[place]  [id number] DOB: [birth year] DOA: 04/17/2022 ?PCP: Leeanne Rio, MD  ? ?  ?Brief Narrative:  ?Martin Hoeg. is a 88 y.o. male with medical history significant of hypertension, history of stroke, GERD, diabetes mellitus type 2, and more presents ED with a chief complaint of chest pain.  He describes chest pain that starts after eating, pain has been intermittent and chronic for about 2 months.  He thought his pain was secondary to indigestion.  Pain starts on the right side of the sternum and radiates across his chest to the left and down his arm.  Has been drinking vinegar, water or taking Tums at home.  Patient admitted to the hospital for chest pain work-up.  Cardiology consulted. ? ?New events last 24 hours / Subjective: ?Feeling well overall, just got back from his stress test this morning.  States that he continues to have pain with eating.  Denies shortness of breath. ? ?Assessment & Plan: ?  ?Principal Problem: ?  Chest pain ?Active Problems: ?  Essential hypertension, benign ?  GERD (gastroesophageal reflux disease) ?  Diabetes mellitus type 2 in nonobese [facility]) ?  Chronic kidney disease, stage 3a ([place]) ?  History of stroke ?  Chronic systolic CHF (congestive heart failure) ([place]) ?  HLD (hyperlipidemia) ? ? ?Chest pain ?-Appreciate cardiology ?-Stress test consistent with prior inferior/inferoseptal myocardial infarction with moderate peri-infarct ischemia. Moderate apical ischemia The study is high risk. LV perfusion is abnormal. There is a large moderate intensity inferior/inferoseptal/ defect with moderate reversibilty. There is a moderate size apical defect that is completely reversible ?-Transfer to [facility] for heart cath tomorrow. Discussed with Dr. Harl Bowie.  ? ?Chronic systolic HF ?-EF [id number] ?-Does not appear fluid overloaded by exam  ? ?History of stroke ?-With residual slurred speech ?-Continue Plavix, lipitor  ? ?Diabetes mellitus ?-A1c 6.3 ?-Sliding scale  insulin ? ?CKD stage IIIa ?-Baseline creatinine 1.5-1.6 ?-Stable ? ?Hyperlipidemia ?-Lipitor ? ?GERD ?-Protonix ? ?DVT prophylaxis:  ?heparin injection 5,000 Units Start: 04/18/22 0600 ?SCDs Start: 04/18/22 0107 ? ?Code Status: Full ?Family Communication: None at bedside ?Disposition Plan:  ?Status is: Observation ?The patient will require care spanning > 2 midnights and should be moved to inpatient because: pending further cardiac work up  ? ?Antimicrobials:  ?Anti-infectives (From admission, onward)  ? ? None  ? ?  ? ? ? ?Objective: ?Vitals:  ? 04/18/22 0529 04/18/22 1031 04/18/22 2116 04/19/22 0354  ?BP: 122/72 (!) 125/93 (!) 148/63 132/78  ?Pulse: 74 (!) 103 69 79  ?Resp: 20 20 20 18   ?Temp: 98 ?F (36.7 ?C) 98.7 ?F (37.1 ?C) 98.3 ?F (36.8 ?C) 98.6 ?F (37 ?C)  ?TempSrc:  Oral Oral   ?SpO2: 100% 96% 100% 98%  ?Weight:      ?Height:      ? ? ?Intake/Output Summary (Last 24 hours) at 04/19/2022 1303 ?Last data filed at 04/19/2022 0900 ?Gross per 24 hour  ?Intake 720 ml  ?Output 900 ml  ?Net -180 ml  ? ?Filed Weights  ? 04/17/22 2039 04/18/22 0110  ?Weight: 61.2 kg 59.6 kg  ? ? ?Examination:  ?General exam: Appears calm and comfortable, hard of hearing  ?Respiratory system: Clear to auscultation. Respiratory effort normal. No respiratory distress. No conversational dyspnea.  ?Cardiovascular system: S1 & S2 heard, RRR. No murmurs. No pedal edema. ?Gastrointestinal system: Abdomen is nondistended, soft and nontender. Normal bowel sounds heard. ?Central nervous system: Alert and oriented. No focal neurological  deficits. Speech clear.  ?Extremities: Symmetric in appearance  ?Skin: No rashes, lesions or ulcers on exposed skin  ?Psychiatry: Judgement and insight appear normal. Mood & affect appropriate.  ? ?Data Reviewed: I have personally reviewed following labs and imaging studies ? ?CBC: ?Recent Labs  ?Lab 04/17/22 ?2053 04/19/22 ?0451  ?WBC 6.1 6.8  ?HGB 12.0* 11.5*  ?HCT 37.1* 35.2*  ?MCV 100.8* 100.3*  ?PLT 184 168   ? ?Basic Metabolic Panel: ?Recent Labs  ?Lab 04/17/22 ?2053 04/18/22 ?[id number] 04/19/22 ?0451  ?NA 142 141 139  ?K 4.3 4.0 3.7  ?CL 110 109 108  ?CO2 25 25 23   ?GLUCOSE 202* 86 108*  ?BUN 34* 34* 33*  ?CREATININE 1.85* 1.70* 1.64*  ?CALCIUM 9.1 8.9 9.0  ? ?GFR: ?Estimated Creatinine Clearance: 27.3 mL/min (A) (by C-G formula based on SCr of 1.64 mg/dL (H)). ?Liver Function Tests: ?Recent Labs  ?Lab 04/17/22 ?2053  ?AST 26  ?ALT 19  ?ALKPHOS 140*  ?BILITOT 0.5  ?PROT 6.5  ?ALBUMIN 4.1  ? ?Recent Labs  ?Lab 04/17/22 ?2053  ?LIPASE 42  ? ?No results for input(s): AMMONIA in the last 168 hours. ?Coagulation Profile: ?No results for input(s): INR, PROTIME in the last 168 hours. ?Cardiac Enzymes: ?No results for input(s): CKTOTAL, CKMB, CKMBINDEX, TROPONINI in the last 168 hours. ?BNP (last 3 results) ?No results for input(s): PROBNP in the last 8760 hours. ?HbA1C: ?Recent Labs  ?  04/18/22 ?[id number]  ?HGBA1C 6.3*  ? ?CBG: ?Recent Labs  ?Lab 04/18/22 ?1200 04/18/22 ?1613 04/18/22 ?2122 04/19/22 ?0730 04/19/22 ?1118  ?GLUCAP 163* 117* 143* 98 168*  ? ?Lipid Profile: ?No results for input(s): CHOL, HDL, LDLCALC, TRIG, CHOLHDL, LDLDIRECT in the last 72 hours. ?Thyroid Function Tests: ?No results for input(s): TSH, T4TOTAL, FREET4, T3FREE, THYROIDAB in the last 72 hours. ?Anemia Panel: ?No results for input(s): VITAMINB12, FOLATE, FERRITIN, TIBC, IRON, RETICCTPCT in the last 72 hours. ?Sepsis Labs: ?No results for input(s): PROCALCITON, LATICACIDVEN in the last 168 hours. ? ?Recent Results (from the past 240 hour(s))  ?Resp Panel by RT-PCR (Flu A&B, Covid) Nasopharyngeal Swab     Status: None  ? Collection Time: 04/17/22 10:31 PM  ? Specimen: Nasopharyngeal Swab; Nasopharyngeal(NP) swabs in vial transport medium  ?Result Value Ref Range Status  ? SARS Coronavirus 2 by RT PCR NEGATIVE NEGATIVE Final  ?  Comment: (NOTE) ?SARS-CoV-2 target nucleic acids are NOT DETECTED. ? ?The SARS-CoV-2 RNA is generally detectable in upper  respiratory ?specimens during the acute phase of infection. The lowest ?concentration of SARS-CoV-2 viral copies this assay can detect is ?138 copies/mL. A negative result does not preclude SARS-Cov-2 ?infection and should not be used as the sole basis for treatment or ?other patient management decisions. A negative result may occur with  ?improper specimen collection/handling, submission of specimen other ?than nasopharyngeal swab, presence of viral mutation(s) within the ?areas targeted by this assay, and inadequate number of viral ?copies(<138 copies/mL). A negative result must be combined with ?clinical observations, patient history, and epidemiological ?information. The expected result is Negative. ? ?Fact Sheet for Patients:  ?EntrepreneurPulse.com.au ? ?Fact Sheet for Healthcare Providers:  ?IncredibleEmployment.be ? ?This test is no t yet approved or cleared by the [place] FDA and  ?has been authorized for detection and/or diagnosis of SARS-CoV-2 by ?FDA under an Emergency Use Authorization (EUA). This EUA will remain  ?in effect (meaning this test can be used) for the duration of the ?COVID-19 declaration under Section 564(b)(1) of the Act, 21 ?U.S.C.section 360bbb-3(b)(1), unless  the authorization is terminated  ?or revoked sooner.  ? ? ?  ? Influenza A by PCR NEGATIVE NEGATIVE Final  ? Influenza B by PCR NEGATIVE NEGATIVE Final  ?  Comment: (NOTE) ?The Xpert Xpress SARS-CoV-2/FLU/RSV plus assay is intended as an aid ?in the diagnosis of influenza from Nasopharyngeal swab specimens and ?should not be used as a sole basis for treatment. Nasal washings and ?aspirates are unacceptable for Xpert Xpress SARS-CoV-2/FLU/RSV ?testing. ? ?Fact Sheet for Patients: ?EntrepreneurPulse.com.au ? ?Fact Sheet for Healthcare Providers: ?IncredibleEmployment.be ? ?This test is not yet approved or cleared by the [place] FDA and ?has been  authorized for detection and/or diagnosis of SARS-CoV-2 by ?FDA under an Emergency Use Authorization (EUA). This EUA will remain ?in effect (meaning this test can be used) for the duration of the ?COVID-19 declaration unde

## 2022-04-19 NOTE — Progress Notes (Addendum)
? ?Progress Note ? ?Patient Name: Martin Frazier. ?Date of Encounter: 04/19/2022 ? ?[facility] Cardiologist: Carlyle Dolly, MD  ? ?Subjective  ? ?Evaluated at the time of his stress test. Denies any recurrent chest pain overnight. Occasional epigastric pain with food consumption. Breathing has been stable.  ? ?Inpatient Medications  ?  ?Scheduled Meds: ? atorvastatin  10 mg Oral Daily  ? clopidogrel  75 mg Oral Daily  ? feeding supplement  237 mL Oral BID BM  ? heparin  5,000 Units Subcutaneous Q8H  ? insulin aspart  0-15 Units Subcutaneous TID WC  ? insulin aspart  0-5 Units Subcutaneous QHS  ? pantoprazole  40 mg Oral Daily  ? tamsulosin  0.4 mg Oral Daily  ? vitamin B-12  1,000 mcg Oral Daily  ? ?Continuous Infusions: ? ?PRN Meds: ?acetaminophen, ondansetron (ZOFRAN) IV, oxyCODONE, technetium tetrofosmin  ? ?Vital Signs  ?  ?Vitals:  ? 04/18/22 0529 04/18/22 1031 04/18/22 2116 04/19/22 0354  ?BP: 122/72 (!) 125/93 (!) 148/63 132/78  ?Pulse: 74 (!) 103 69 79  ?Resp: 20 20 20 18   ?Temp: 98 ?F (36.7 ?C) 98.7 ?F (37.1 ?C) 98.3 ?F (36.8 ?C) 98.6 ?F (37 ?C)  ?TempSrc:  Oral Oral   ?SpO2: 100% 96% 100% 98%  ?Weight:      ?Height:      ? ? ?Intake/Output Summary (Last 24 hours) at 04/19/2022 0755 ?Last data filed at 04/19/2022 0437 ?Gross per 24 hour  ?Intake 600 ml  ?Output 900 ml  ?Net -300 ml  ? ? ?  04/18/2022  ?  1:10 AM 04/17/2022  ?  8:39 PM 02/28/2021  ?  9:42 AM  ?Last 3 Weights  ?Weight (lbs) 131 lb 4.8 oz 135 lb 139 lb 9.6 oz  ?Weight (kg) 59.557 kg 61.236 kg 63.322 kg  ?   ? ?Telemetry  ?  ?NSR, HR in 60's to 70's with occasional PVC's.  - Personally Reviewed ? ?ECG  ?  ?No new tracings.  ? ?Physical Exam  ? ?GEN: Pleasant elderly male appearing in no acute distress.  Hard of hearing.  ?Neck: No JVD ?Cardiac: RRR, no murmurs, rubs, or gallops.  ?Respiratory: Clear to auscultation bilaterally without wheezing or rales. ?GI: Soft, nontender, non-distended  ?MS: No pitting edema; No deformity. ?Neuro:  Nonfocal   ?Psych: Normal affect  ? ?Labs  ?  ?High Sensitivity Troponin:   ?Recent Labs  ?Lab 04/17/22 ?2053 04/17/22 ?2313 04/18/22 ?[id number] 04/18/22 ?1317  ?TROPONINIHS 15 28* 46* 27*  ?   ?Chemistry ?Recent Labs  ?Lab 04/17/22 ?2053 04/18/22 ?[id number] 04/19/22 ?0451  ?NA 142 141 139  ?K 4.3 4.0 3.7  ?CL 110 109 108  ?CO2 25 25 23   ?GLUCOSE 202* 86 108*  ?BUN 34* 34* 33*  ?CREATININE 1.85* 1.70* 1.64*  ?CALCIUM 9.1 8.9 9.0  ?PROT 6.5  --   --   ?ALBUMIN 4.1  --   --   ?AST 26  --   --   ?ALT 19  --   --   ?ALKPHOS 140*  --   --   ?BILITOT 0.5  --   --   ?GFRNONAA 35* 39* 40*  ?ANIONGAP 7 7 8   ?  ?Lipids No results for input(s): CHOL, TRIG, HDL, LABVLDL, LDLCALC, CHOLHDL in the last 168 hours.  ?Hematology ?Recent Labs  ?Lab 04/17/22 ?2053 04/19/22 ?0451  ?WBC 6.1 6.8  ?RBC 3.68* 3.51*  ?HGB 12.0* 11.5*  ?HCT 37.1* 35.2*  ?MCV 100.8* 100.3*  ?  MCH 32.6 32.8  ?MCHC 32.3 32.7  ?RDW 13.2 13.3  ?PLT 184 168  ? ?Thyroid No results for input(s): TSH, FREET4 in the last 168 hours.  ?BNP ?Recent Labs  ?Lab 04/17/22 ?2313  ?BNP 177.0*  ?  ?DDimer No results for input(s): DDIMER in the last 168 hours.  ? ?Radiology  ?  ?DG Chest Portable 1 View ? ?Result Date: 04/17/2022 ?CLINICAL DATA:  Chest pain EXAM: PORTABLE CHEST 1 VIEW COMPARISON:  03/22/2020 FINDINGS: Mild cardiomegaly. Aortic atherosclerosis. No confluent opacities, effusions or edema. No acute bony abnormality. IMPRESSION: Mild cardiomegaly.  No active disease. Electronically Signed   By: Rolm Baptise M.D.   On: 04/17/2022 21:15  ? ? ?Cardiac Studies  ? ?Echocardiogram: 04/18/2022 ?IMPRESSIONS  ? ? ? 1. Left ventricular ejection fraction, by estimation, is 55 to 60%. The  ?left ventricle has normal function. The left ventricle has no regional  ?wall motion abnormalities. There is mild left ventricular hypertrophy.  ?Left ventricular diastolic parameters  ?are consistent with Grade I diastolic dysfunction (impaired relaxation).  ? 2. Right ventricular systolic function is normal. The  right ventricular  ?size is normal. There is normal pulmonary artery systolic pressure.  ? 3. The mitral valve is abnormal. Mild mitral valve regurgitation. No  ?evidence of mitral stenosis.  ? 4. The aortic valve is tricuspid. There is mild calcification of the  ?aortic valve. There is mild thickening of the aortic valve. Aortic valve  ?regurgitation is mild. No aortic stenosis is present.  ? 5. The inferior vena cava is normal in size with greater than 50%  ?respiratory variability, suggesting right atrial pressure of 3 mmHg.  ? ? ?Patient Profile  ?   ?88 y.o. male w/ PMH of HLD, Type 2 DM, GERD, Stage 3 CKD, prior tobacco use and prior CVA (right thalamic infarction in 02/2020) who is currently admitted for evaluation of chest pain.  ? ?Assessment & Plan  ?  ?1. Chest Pain with Mixed Features ?- Presented with episodes of chest pain which had mixed qualities as some episodes resemble his prior GERD and improve with TUMS and vinegar/water while other episodes have occurred with exertion such as walking to his mailbox.  ?- Hs troponin values have overall been flat at 15, 28, 46 and 27. EKG shows NSR, HR 90 with slight ST depression along Lead III which is similar to prior tracings. Echo yesterday showed a preserved EF of 55-60% with no regional wall motion abnormalities. A Lexiscan Myoview was recommended for further evaluation. Performed this morning with the results pending following stress images.  ? ?2. HLD ?- Followed by his PCP as an outpatient and he has been continued on Atorvastatin 10mg  daily.  ?  ?3. Type 2 DM ?- Hgb A1c at 6.3 this admission. Management per the admitting team.  ?  ?4. Stage 3 CKD ?- Baseline creatinine 1.5 - 1.6. Elevated to 1.85 at the time of admission, improved to 1.64 today.  ?  ?5. 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 Continue Plavix and  Atorvastatin.  ? ? ?For questions or updates, please contact [place] ?Please consult [email or web address] for contact info under  ? ?  ?   ?Signed, ?Erma Heritage, PA-C  ?04/19/2022, 7:55 AM   ? ?Attending note ? ?Patient seen and discussed with PA Ahmed Prima, I agree with her documentation.  Admitted with chest pains of mixed description, some occurring after meals better with antacids but also some pains with exertion. Mild peak trop of 46 but did have a crescendo/decrescendo pattern suggesting possible ischemia. EKG without specific acute ischemic changes, LVEF 55-60% no WMAs. Lexiscan this AM moderate infero/inferoseptal infarct with moderate peri-infarct inschemia, moderate apical ischemia as apical defect is completely reversible, borderline TID 1.25. Together would qualify as high risk. Has continued to have intermittent nonspecific chest pains throughout admission. Patient agrees to cath, will ask medicine team to arrange transfer and we will arrange cath ? ?Shared Decision Making/Informed Consent ?The risks [stroke (1 in 1000), death (1 in 49), kidney failure [usually temporary] (1 in 500), bleeding (1 in 200), allergic reaction [possibly serious] (1 in 200)], benefits (diagnostic support and management of coronary artery disease) and alternatives of a cardiac catheterization were discussed in detail with Mr. Knoell and he is willing to proceed.  ? ?Carlyle Dolly MD ?

## 2022-04-19 NOTE — Plan of Care (Signed)

## 2022-04-19 NOTE — Care Management Obs Status (Signed)
MEDICARE OBSERVATION STATUS NOTIFICATION ? ? ?Patient Details  ?Name: Martin Frazier. ?MRN: [id number] ?Date of Birth: 09/06/1934 ? ? ?Medicare Observation Status Notification Given:  Yes ? ? ? ?Tommy Medal ?04/19/2022, 9:28 AM ?

## 2022-04-19 NOTE — Discharge Instructions (Signed)
Nutrition Post Hospital Stay ?Proper nutrition can help your body recover from illness and injury.   ?Foods and beverages high in protein, vitamins, and minerals help rebuild muscle loss, promote healing, & reduce fall risk.  ? ?In addition to eating healthy foods, a nutrition shake is an easy, delicious way to get the nutrition you need during and after your hospital stay ? ?It is recommended that you continue to drink 2 bottles per day of:  Ensure or Boost High Protein for at least 1 month (30 days) after your hospital stay  ? ?Tips for adding a nutrition shake into your routine: ?As allowed, drink one with vitamins or medications instead of water or juice ?Enjoy one as a tasty mid-morning or afternoon snack ?Drink cold or make a milkshake out of it ?Drink one instead of milk with cereal or snacks ?Use as a coffee creamer ?  ?Available at the following grocery stores and pharmacies:           ?* Karin Golden * [employer] * [employer]  ?* [employer]          * [employer] * [employer]  ?* [facility]      * Target  * [employer]   ?* [facility]  * [employer]   ?[facility] [phone]  ?          ?For COUPONS visit: www.ensure.com/join or [email or web address].com.br  ? ?Suggested Substitutions ?Ensure Plus = Boost Plus = [employer] Breakfast Essentials = Boost Compact ?Ensure Active Clear = Boost Breeze ?Glucerna Shake = Boost Glucose Control = [employer] Breakfast Essentials SUGAR FREE ? ?   ?

## 2022-04-20 ENCOUNTER — Encounter (HOSPITAL_COMMUNITY)
Admission: EM | Disposition: A | Discharge status: Home / Self Care | Payer: Self-pay | Source: Home / Self Care | Attending: Internal Medicine

## 2022-04-20 DIAGNOSIS — I2510 Atherosclerotic heart disease of native coronary artery without angina pectoris: Secondary | ICD-10-CM | POA: Diagnosis not present

## 2022-04-20 DIAGNOSIS — R9439 Abnormal result of other cardiovascular function study: Secondary | ICD-10-CM

## 2022-04-20 DIAGNOSIS — Z882 Allergy status to sulfonamides status: Secondary | ICD-10-CM | POA: Diagnosis not present

## 2022-04-20 DIAGNOSIS — I200 Unstable angina: Secondary | ICD-10-CM | POA: Diagnosis not present

## 2022-04-20 DIAGNOSIS — E1122 Type 2 diabetes mellitus with diabetic chronic kidney disease: Secondary | ICD-10-CM | POA: Diagnosis present

## 2022-04-20 DIAGNOSIS — Z7902 Long term (current) use of antithrombotics/antiplatelets: Secondary | ICD-10-CM | POA: Diagnosis not present

## 2022-04-20 DIAGNOSIS — I25118 Atherosclerotic heart disease of native coronary artery with other forms of angina pectoris: Secondary | ICD-10-CM | POA: Diagnosis present

## 2022-04-20 DIAGNOSIS — Z87891 Personal history of nicotine dependence: Secondary | ICD-10-CM | POA: Diagnosis not present

## 2022-04-20 DIAGNOSIS — Z8249 Family history of ischemic heart disease and other diseases of the circulatory system: Secondary | ICD-10-CM | POA: Diagnosis not present

## 2022-04-20 DIAGNOSIS — Z7984 Long term (current) use of oral hypoglycemic drugs: Secondary | ICD-10-CM | POA: Diagnosis not present

## 2022-04-20 DIAGNOSIS — N1831 Chronic kidney disease, stage 3a: Secondary | ICD-10-CM | POA: Diagnosis present

## 2022-04-20 DIAGNOSIS — I69328 Other speech and language deficits following cerebral infarction: Secondary | ICD-10-CM | POA: Diagnosis not present

## 2022-04-20 DIAGNOSIS — E785 Hyperlipidemia, unspecified: Secondary | ICD-10-CM | POA: Diagnosis present

## 2022-04-20 DIAGNOSIS — Z79899 Other long term (current) drug therapy: Secondary | ICD-10-CM | POA: Diagnosis not present

## 2022-04-20 DIAGNOSIS — K219 Gastro-esophageal reflux disease without esophagitis: Secondary | ICD-10-CM | POA: Diagnosis present

## 2022-04-20 DIAGNOSIS — I5022 Chronic systolic (congestive) heart failure: Secondary | ICD-10-CM | POA: Diagnosis present

## 2022-04-20 DIAGNOSIS — Z20822 Contact with and (suspected) exposure to covid-19: Secondary | ICD-10-CM | POA: Diagnosis present

## 2022-04-20 DIAGNOSIS — I208 Other forms of angina pectoris: Secondary | ICD-10-CM | POA: Diagnosis not present

## 2022-04-20 DIAGNOSIS — I252 Old myocardial infarction: Secondary | ICD-10-CM | POA: Diagnosis not present

## 2022-04-20 DIAGNOSIS — E1142 Type 2 diabetes mellitus with diabetic polyneuropathy: Secondary | ICD-10-CM | POA: Diagnosis present

## 2022-04-20 DIAGNOSIS — I255 Ischemic cardiomyopathy: Secondary | ICD-10-CM | POA: Diagnosis present

## 2022-04-20 DIAGNOSIS — I130 Hypertensive heart and chronic kidney disease with heart failure and stage 1 through stage 4 chronic kidney disease, or unspecified chronic kidney disease: Secondary | ICD-10-CM | POA: Diagnosis present

## 2022-04-20 DIAGNOSIS — R0789 Other chest pain: Secondary | ICD-10-CM | POA: Diagnosis present

## 2022-04-20 HISTORY — PX: CORONARY STENT INTERVENTION: CATH118234

## 2022-04-20 HISTORY — PX: LEFT HEART CATH AND CORONARY ANGIOGRAPHY: CATH118249

## 2022-04-20 LAB — BASIC METABOLIC PANEL
Anion gap: 7 (ref 5–15)
BUN: 37 mg/dL — ABNORMAL HIGH (ref 8–23)
CO2: 26 mmol/L (ref 22–32)
Calcium: 9 mg/dL (ref 8.9–10.3)
Chloride: 106 mmol/L (ref 98–111)
Creatinine, Ser: 1.69 mg/dL — ABNORMAL HIGH (ref 0.61–1.24)
GFR, Estimated: 39 mL/min — ABNORMAL LOW (ref >60)
Glucose, Bld: 102 mg/dL — ABNORMAL HIGH (ref 70–99)
Potassium: 3.9 mmol/L (ref 3.5–5.1)
Sodium: 139 mmol/L (ref 135–145)

## 2022-04-20 LAB — GLUCOSE, CAPILLARY
Glucose-Capillary: 103 mg/dL — ABNORMAL HIGH (ref 70–99)
Glucose-Capillary: 144 mg/dL — ABNORMAL HIGH (ref 70–99)
Glucose-Capillary: 180 mg/dL — ABNORMAL HIGH (ref 70–99)

## 2022-04-20 LAB — POCT ACTIVATED CLOTTING TIME: Activated Clotting Time: 329 seconds

## 2022-04-20 SURGERY — LEFT HEART CATH AND CORONARY ANGIOGRAPHY
Anesthesia: LOCAL

## 2022-04-20 MED ORDER — LIDOCAINE HCL (PF) 1 % IJ SOLN
INTRAMUSCULAR | Status: AC
  Filled 2022-04-20: qty 30

## 2022-04-20 MED ORDER — SODIUM CHLORIDE 0.9 % IV SOLN
INTRAVENOUS | Status: AC | PRN
  Administered 2022-04-20: 75 mL/h via INTRAVENOUS

## 2022-04-20 MED ORDER — HEPARIN SODIUM (PORCINE) 1000 UNIT/ML IJ SOLN
INTRAMUSCULAR | Status: AC
  Filled 2022-04-20: qty 10

## 2022-04-20 MED ORDER — SODIUM CHLORIDE 0.9 % WEIGHT BASED INFUSION: 3.0000 mL/kg/h | INTRAVENOUS | Status: DC

## 2022-04-20 MED ORDER — HYDRALAZINE HCL 20 MG/ML IJ SOLN: 10.0000 mg | INTRAMUSCULAR | Status: DC | PRN

## 2022-04-20 MED ORDER — FENTANYL CITRATE (PF) 100 MCG/2ML IJ SOLN
INTRAMUSCULAR | Status: AC
Start: 2022-04-20 — End: ?
  Filled 2022-04-20: qty 2

## 2022-04-20 MED ORDER — SODIUM CHLORIDE 0.9% FLUSH: 3.0000 mL | Freq: Two times a day (BID) | INTRAVENOUS | Status: DC

## 2022-04-20 MED ORDER — SODIUM CHLORIDE 0.9 % WEIGHT BASED INFUSION: 1.0000 mL/kg/h | INTRAVENOUS | Status: DC

## 2022-04-20 MED ORDER — LABETALOL HCL 5 MG/ML IV SOLN: 10.0000 mg | INTRAVENOUS | Status: DC | PRN

## 2022-04-20 MED ORDER — SODIUM CHLORIDE 0.9% FLUSH
3.0000 mL | Freq: Two times a day (BID) | INTRAVENOUS | Status: DC
  Administered 2022-04-20 – 2022-04-21 (×2): 3 mL via INTRAVENOUS

## 2022-04-20 MED ORDER — SODIUM CHLORIDE 0.9 % WEIGHT BASED INFUSION
1.0000 mL/kg/h | INTRAVENOUS | Status: DC
  Administered 2022-04-20: 1 mL/kg/h via INTRAVENOUS

## 2022-04-20 MED ORDER — LIDOCAINE HCL (PF) 1 % IJ SOLN
INTRAMUSCULAR | Status: DC | PRN
  Administered 2022-04-20: 3 mL

## 2022-04-20 MED ORDER — SODIUM CHLORIDE 0.9 % IV SOLN: 250.0000 mL | INTRAVENOUS | Status: DC | PRN

## 2022-04-20 MED ORDER — MORPHINE SULFATE (PF) 2 MG/ML IV SOLN: 1.0000 mg | INTRAVENOUS | Status: DC | PRN

## 2022-04-20 MED ORDER — HEPARIN (PORCINE) IN NACL 1000-0.9 UT/500ML-% IV SOLN
INTRAVENOUS | Status: AC
Start: 2022-04-20 — End: ?
  Filled 2022-04-20: qty 1000

## 2022-04-20 MED ORDER — HEPARIN (PORCINE) IN NACL 1000-0.9 UT/500ML-% IV SOLN
INTRAVENOUS | Status: DC | PRN
Start: 2022-04-20 — End: 2022-04-20
  Administered 2022-04-20 (×2): 500 mL

## 2022-04-20 MED ORDER — HEPARIN SODIUM (PORCINE) 1000 UNIT/ML IJ SOLN
INTRAMUSCULAR | Status: DC | PRN
  Administered 2022-04-20: 3500 [IU] via INTRAVENOUS
  Administered 2022-04-20: 3000 [IU] via INTRAVENOUS

## 2022-04-20 MED ORDER — ASPIRIN EC 81 MG PO TBEC
81.0000 mg | DELAYED_RELEASE_TABLET | Freq: Every day | ORAL | Status: DC
  Administered 2022-04-20 – 2022-04-21 (×2): 81 mg via ORAL
  Filled 2022-04-20 (×2): qty 1

## 2022-04-20 MED ORDER — SODIUM CHLORIDE 0.9% FLUSH: 3.0000 mL | INTRAVENOUS | Status: DC | PRN

## 2022-04-20 MED ORDER — FENTANYL CITRATE (PF) 100 MCG/2ML IJ SOLN
INTRAMUSCULAR | Status: DC | PRN
Start: 2022-04-20 — End: 2022-04-20
  Administered 2022-04-20: 25 ug via INTRAVENOUS

## 2022-04-20 MED ORDER — VERAPAMIL HCL 2.5 MG/ML IV SOLN
INTRAVENOUS | Status: AC
  Filled 2022-04-20: qty 2

## 2022-04-20 MED ORDER — VERAPAMIL HCL 2.5 MG/ML IV SOLN
INTRAVENOUS | Status: DC | PRN
  Administered 2022-04-20: 10 mL via INTRA_ARTERIAL

## 2022-04-20 MED ORDER — NITROGLYCERIN 1 MG/10 ML FOR IR/CATH LAB
INTRA_ARTERIAL | Status: AC
  Filled 2022-04-20: qty 10

## 2022-04-20 MED ORDER — NITROGLYCERIN 1 MG/10 ML FOR IR/CATH LAB
INTRA_ARTERIAL | Status: DC | PRN
Start: 2022-04-20 — End: 2022-04-20
  Administered 2022-04-20: 200 ug via INTRACORONARY

## 2022-04-20 SURGICAL SUPPLY — 17 items
BALL SAPPHIRE NC24 3.0X15 (BALLOONS) ×2
BALLN SAPPHIRE 2.5X15 (BALLOONS) ×2
BALLOON SAPPHIRE 2.5X15 (BALLOONS) IMPLANT
BALLOON SAPPHIRE NC24 3.0X15 (BALLOONS) IMPLANT
CATH OPTITORQUE TIG 4.0 5F (CATHETERS) ×1 IMPLANT
CATH VISTA GUIDE 6FR XBLAD3.5 (CATHETERS) ×1 IMPLANT
DEVICE RAD COMP TR BAND LRG (VASCULAR PRODUCTS) ×1 IMPLANT
GLIDESHEATH SLEND SS 6F .021 (SHEATH) ×1 IMPLANT
GUIDEWIRE INQWIRE 1.5J.035X260 (WIRE) IMPLANT
INQWIRE 1.5J .035X260CM (WIRE) ×2
KIT ENCORE 26 ADVANTAGE (KITS) ×1 IMPLANT
KIT HEART LEFT (KITS) ×3 IMPLANT
PACK CARDIAC CATHETERIZATION (CUSTOM PROCEDURE TRAY) ×3 IMPLANT
STENT ONYX FRONTIER 2.75X30 (Permanent Stent) ×1 IMPLANT
TRANSDUCER W/STOPCOCK (MISCELLANEOUS) ×3 IMPLANT
TUBING CIL FLEX 10 FLL-RA (TUBING) ×3 IMPLANT
WIRE ASAHI PROWATER 180CM (WIRE) ×1 IMPLANT

## 2022-04-20 NOTE — Progress Notes (Signed)
Received patient from [facility]  via [facility].  Patient alert and oriented X4, skin warm and dry, resp even and unlabored, denies chest pain at this time.  Pt placed on bedside monitor, IVF infusing in the left forearm with NS , and consent signed.  Call bell in reach and patient waiting for cath procedure. ?

## 2022-04-20 NOTE — H&P (View-Only) (Signed)
? ?Progress Note ? ?Patient Name: Martin Frazier. ?Date of Encounter: 04/20/2022 ? ?[facility] Cardiologist: Carlyle Dolly, MD  ? ?Subjective  ? ?Denies any pain this morning. Has been NPO except for some clear liquids this AM. Breathing at baseline. No additional questions regarding his upcoming catheterization which is scheduled for later this afternoon.  ? ?Inpatient Medications  ?  ?Scheduled Meds: ? atorvastatin  10 mg Oral Daily  ? clopidogrel  75 mg Oral Daily  ? feeding supplement  237 mL Oral BID BM  ? heparin  5,000 Units Subcutaneous Q8H  ? insulin aspart  0-15 Units Subcutaneous TID WC  ? insulin aspart  0-5 Units Subcutaneous QHS  ? pantoprazole  40 mg Oral Daily  ? tamsulosin  0.4 mg Oral Daily  ? vitamin B-12  1,000 mcg Oral Daily  ? ?Continuous Infusions: ? ?PRN Meds: ?acetaminophen, ondansetron (ZOFRAN) IV, oxyCODONE  ? ?Vital Signs  ?  ?Vitals:  ? 04/19/22 0354 04/19/22 1352 04/19/22 2113 04/20/22 0359  ?BP: 132/78 (!) 114/56 (!) 123/96 127/69  ?Pulse: 79 87 78 79  ?Resp: 18 18 17 18   ?Temp: 98.6 ?F (37 ?C) (!) 97.5 ?F (36.4 ?C) 98 ?F (36.7 ?C) 98.3 ?F (36.8 ?C)  ?TempSrc:  Oral    ?SpO2: 98% 97% 99% 99%  ?Weight:      ?Height:      ? ? ?Intake/Output Summary (Last 24 hours) at 04/20/2022 0930 ?Last data filed at 04/20/2022 0900 ?Gross per 24 hour  ?Intake 722.05 ml  ?Output 300 ml  ?Net 422.05 ml  ? ? ?  04/18/2022  ?  1:10 AM 04/17/2022  ?  8:39 PM 02/28/2021  ?  9:42 AM  ?Last 3 Weights  ?Weight (lbs) 131 lb 4.8 oz 135 lb 139 lb 9.6 oz  ?Weight (kg) 59.557 kg 61.236 kg 63.322 kg  ?   ? ?Telemetry  ?  ?NSR, HR in 70's to 80's. Occasional PVC's and rare couplets.  - Personally Reviewed ? ?ECG  ?  ?No new tracings.  ? ?Physical Exam  ? ?GEN: Pleasant elderly male appearing in no acute distress. Hard of hearing. ?Neck: No JVD ?Cardiac: RRR, no murmurs, rubs, or gallops.  ?Respiratory: Clear to auscultation bilaterally. ?GI: Soft, nontender, non-distended  ?MS: No pitting edema; No  deformity. ?Neuro:  Nonfocal  ?Psych: Normal affect  ? ?Labs  ?  ?High Sensitivity Troponin:   ?Recent Labs  ?Lab 04/17/22 ?2053 04/17/22 ?2313 04/18/22 ?[id number] 04/18/22 ?1317  ?TROPONINIHS 15 28* 46* 27*  ?   ?Chemistry ?Recent Labs  ?Lab 04/17/22 ?2053 04/18/22 ?[id number] 04/19/22 ?0451 04/20/22 ?[id number]  ?NA 142 141 139 139  ?K 4.3 4.0 3.7 3.9  ?CL 110 109 108 106  ?CO2 25 25 23 26   ?GLUCOSE 202* 86 108* 102*  ?BUN 34* 34* 33* 37*  ?CREATININE 1.85* 1.70* 1.64* 1.69*  ?CALCIUM 9.1 8.9 9.0 9.0  ?PROT 6.5  --   --   --   ?ALBUMIN 4.1  --   --   --   ?AST 26  --   --   --   ?ALT 19  --   --   --   ?ALKPHOS 140*  --   --   --   ?BILITOT 0.5  --   --   --   ?GFRNONAA 35* 39* 40* 39*  ?ANIONGAP 7 7 8 7   ?  ?Lipids No results for input(s): CHOL, TRIG, HDL, LABVLDL, LDLCALC, CHOLHDL in the last 168  hours.  ?Hematology ?Recent Labs  ?Lab 04/17/22 ?2053 04/19/22 ?0451  ?WBC 6.1 6.8  ?RBC 3.68* 3.51*  ?HGB 12.0* 11.5*  ?HCT 37.1* 35.2*  ?MCV 100.8* 100.3*  ?MCH 32.6 32.8  ?MCHC 32.3 32.7  ?RDW 13.2 13.3  ?PLT 184 168  ? ?Thyroid No results for input(s): TSH, FREET4 in the last 168 hours.  ?BNP ?Recent Labs  ?Lab 04/17/22 ?2313  ?BNP 177.0*  ?  ?DDimer No results for input(s): DDIMER in the last 168 hours.  ? ?Radiology  ?  ? ?Cardiac Studies  ? ?Echocardiogram: 04/18/2022 ?IMPRESSIONS  ? ? ? 1. Left ventricular ejection fraction, by estimation, is 55 to 60%. The  ?left ventricle has normal function. The left ventricle has no regional  ?wall motion abnormalities. There is mild left ventricular hypertrophy.  ?Left ventricular diastolic parameters  ?are consistent with Grade I diastolic dysfunction (impaired relaxation).  ? 2. Right ventricular systolic function is normal. The right ventricular  ?size is normal. There is normal pulmonary artery systolic pressure.  ? 3. The mitral valve is abnormal. Mild mitral valve regurgitation. No  ?evidence of mitral stenosis.  ? 4. The aortic valve is tricuspid. There is mild calcification of the   ?aortic valve. There is mild thickening of the aortic valve. Aortic valve  ?regurgitation is mild. No aortic stenosis is present.  ? 5. The inferior vena cava is normal in size with greater than 50%  ?respiratory variability, suggesting right atrial pressure of 3 mmHg.  ? ?NST: 04/19/2022 ?Findings are consistent with prior inferior/inferoseptal myocardial infarction with moderate peri-infarct ischemia. Moderate apical ischemia The study is high risk. High risk based on at least moderate ischemia in multiple areas. Borderline TID 1.25 may suggest component of balanced ischemia ?  No ST deviation was noted. ?  LV perfusion is abnormal. There is a large moderate intensity inferior/inferoseptal/ defect with moderate reversibilty. There is a moderate size apical defect that is completely reversible. ?  Left ventricular function is abnormal. Nuclear stress EF: 38 %. The left ventricular ejection fraction is moderately decreased (30-44%). End diastolic cavity size is normal. ?  ?Patient Profile  ?   ?88 y.o. male w/ PMH of HLD, Type 2 DM, GERD, Stage 3 CKD, prior tobacco use and prior CVA (right thalamic infarction in 02/2020) who is currently admitted for evaluation of chest pain.  ? ?Assessment & Plan  ?  ?1. Chest Pain with Mixed Features/Abnormal Stress Test ?- His episodes of pain on admission overall had mixed qualities as some episodes resembled his prior GERD and would improve with TUMS and a vinegar and water mixture while other episodes would occur with exertion. Hs Troponin values peaked at 46 and were trending down to 27 on most recent check. Echo showed a preserved EF of 55-60% with no regional WMA.  ?- Lexiscan Myoview performed yesterday and showed evidence of prior inferior infarct with moderate peri-infarct ischemia and moderate apical ischemia with borderline TID of 1.25 and overall a high-risk study. Dr. Harl Bowie reviewed findings with the patient and he was in agreement with a cardiac catheterization  for definitive evaluation. Risks and benefits previously reviewed and he is in agreement to proceed. Scheduled for later this afternoon. Will remain on the Hospitalist service at [facility].  ?- Remains on Plavix and statin therapy. Will add ASA 81mg  daily.  ? ?2. HLD ?- Followed by his PCP as an outpatient and he has been continued on Atorvastatin 10mg  daily. Will recheck FLP with AM labs  as he may require further titration of this pending his catheterization results.  ?  ?3. Type 2 DM ?- Hgb A1c at 6.3 this admission. Management per the admitting team.  ?  ?4. Stage 3 CKD ?- Baseline creatinine 1.5 - 1.6. Peaked at 1.85 this admission and at 1.69 today. He received IV fluids yesterday but his AM cath fluids have not yet been released and were still under signed and held orders. Will release now. No LV gram at the time of his catheterization.  ?  ?5. 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 No recent progression of symptoms. Remains on Plavix and statin therapy.  ?  ? ?For questions or updates, please contact [place] ?Please consult [email or web address] for contact info under  ? ?  ?   ?Signed, ?Erma Heritage, PA-C  ?04/20/2022, 9:30 AM   ? ? ?Patient seen and examined   I agree with findings as noted above by B STrader   Pt is comfortable    Denies CP   ?ON exam ?Pt in in NAD ?Lungs are CTA   ?Cardiac RRR  No S3  ?Abd is supple  ?Ext are without edema ? ?Plan for L heart cath this afternoon   Tx to [facility]    ? ?Dorris Carnes MD  ?

## 2022-04-20 NOTE — Progress Notes (Addendum)
? ?Progress Note ? ?Patient Name: Martin Frazier. ?Date of Encounter: 04/20/2022 ? ?[facility] Cardiologist: Carlyle Dolly, MD  ? ?Subjective  ? ?Denies any pain this morning. Has been NPO except for some clear liquids this AM. Breathing at baseline. No additional questions regarding his upcoming catheterization which is scheduled for later this afternoon.  ? ?Inpatient Medications  ?  ?Scheduled Meds: ? atorvastatin  10 mg Oral Daily  ? clopidogrel  75 mg Oral Daily  ? feeding supplement  237 mL Oral BID BM  ? heparin  5,000 Units Subcutaneous Q8H  ? insulin aspart  0-15 Units Subcutaneous TID WC  ? insulin aspart  0-5 Units Subcutaneous QHS  ? pantoprazole  40 mg Oral Daily  ? tamsulosin  0.4 mg Oral Daily  ? vitamin B-12  1,000 mcg Oral Daily  ? ?Continuous Infusions: ? ?PRN Meds: ?acetaminophen, ondansetron (ZOFRAN) IV, oxyCODONE  ? ?Vital Signs  ?  ?Vitals:  ? 04/19/22 0354 04/19/22 1352 04/19/22 2113 04/20/22 0359  ?BP: 132/78 (!) 114/56 (!) 123/96 127/69  ?Pulse: 79 87 78 79  ?Resp: 18 18 17 18   ?Temp: 98.6 ?F (37 ?C) (!) 97.5 ?F (36.4 ?C) 98 ?F (36.7 ?C) 98.3 ?F (36.8 ?C)  ?TempSrc:  Oral    ?SpO2: 98% 97% 99% 99%  ?Weight:      ?Height:      ? ? ?Intake/Output Summary (Last 24 hours) at 04/20/2022 0930 ?Last data filed at 04/20/2022 0900 ?Gross per 24 hour  ?Intake 722.05 ml  ?Output 300 ml  ?Net 422.05 ml  ? ? ?  04/18/2022  ?  1:10 AM 04/17/2022  ?  8:39 PM 02/28/2021  ?  9:42 AM  ?Last 3 Weights  ?Weight (lbs) 131 lb 4.8 oz 135 lb 139 lb 9.6 oz  ?Weight (kg) 59.557 kg 61.236 kg 63.322 kg  ?   ? ?Telemetry  ?  ?NSR, HR in 70's to 80's. Occasional PVC's and rare couplets.  - Personally Reviewed ? ?ECG  ?  ?No new tracings.  ? ?Physical Exam  ? ?GEN: Pleasant elderly male appearing in no acute distress. Hard of hearing. ?Neck: No JVD ?Cardiac: RRR, no murmurs, rubs, or gallops.  ?Respiratory: Clear to auscultation bilaterally. ?GI: Soft, nontender, non-distended  ?MS: No pitting edema; No  deformity. ?Neuro:  Nonfocal  ?Psych: Normal affect  ? ?Labs  ?  ?High Sensitivity Troponin:   ?Recent Labs  ?Lab 04/17/22 ?2053 04/17/22 ?2313 04/18/22 ?[id number] 04/18/22 ?1317  ?TROPONINIHS 15 28* 46* 27*  ?   ?Chemistry ?Recent Labs  ?Lab 04/17/22 ?2053 04/18/22 ?[id number] 04/19/22 ?0451 04/20/22 ?[id number]  ?NA 142 141 139 139  ?K 4.3 4.0 3.7 3.9  ?CL 110 109 108 106  ?CO2 25 25 23 26   ?GLUCOSE 202* 86 108* 102*  ?BUN 34* 34* 33* 37*  ?CREATININE 1.85* 1.70* 1.64* 1.69*  ?CALCIUM 9.1 8.9 9.0 9.0  ?PROT 6.5  --   --   --   ?ALBUMIN 4.1  --   --   --   ?AST 26  --   --   --   ?ALT 19  --   --   --   ?ALKPHOS 140*  --   --   --   ?BILITOT 0.5  --   --   --   ?GFRNONAA 35* 39* 40* 39*  ?ANIONGAP 7 7 8 7   ?  ?Lipids No results for input(s): CHOL, TRIG, HDL, LABVLDL, LDLCALC, CHOLHDL in the last 168  hours.  ?Hematology ?Recent Labs  ?Lab 04/17/22 ?2053 04/19/22 ?0451  ?WBC 6.1 6.8  ?RBC 3.68* 3.51*  ?HGB 12.0* 11.5*  ?HCT 37.1* 35.2*  ?MCV 100.8* 100.3*  ?MCH 32.6 32.8  ?MCHC 32.3 32.7  ?RDW 13.2 13.3  ?PLT 184 168  ? ?Thyroid No results for input(s): TSH, FREET4 in the last 168 hours.  ?BNP ?Recent Labs  ?Lab 04/17/22 ?2313  ?BNP 177.0*  ?  ?DDimer No results for input(s): DDIMER in the last 168 hours.  ? ?Radiology  ?  ? ?Cardiac Studies  ? ?Echocardiogram: 04/18/2022 ?IMPRESSIONS  ? ? ? 1. Left ventricular ejection fraction, by estimation, is 55 to 60%. The  ?left ventricle has normal function. The left ventricle has no regional  ?wall motion abnormalities. There is mild left ventricular hypertrophy.  ?Left ventricular diastolic parameters  ?are consistent with Grade I diastolic dysfunction (impaired relaxation).  ? 2. Right ventricular systolic function is normal. The right ventricular  ?size is normal. There is normal pulmonary artery systolic pressure.  ? 3. The mitral valve is abnormal. Mild mitral valve regurgitation. No  ?evidence of mitral stenosis.  ? 4. The aortic valve is tricuspid. There is mild calcification of the   ?aortic valve. There is mild thickening of the aortic valve. Aortic valve  ?regurgitation is mild. No aortic stenosis is present.  ? 5. The inferior vena cava is normal in size with greater than 50%  ?respiratory variability, suggesting right atrial pressure of 3 mmHg.  ? ?NST: 04/19/2022 ?Findings are consistent with prior inferior/inferoseptal myocardial infarction with moderate peri-infarct ischemia. Moderate apical ischemia The study is high risk. High risk based on at least moderate ischemia in multiple areas. Borderline TID 1.25 may suggest component of balanced ischemia ?  No ST deviation was noted. ?  LV perfusion is abnormal. There is a large moderate intensity inferior/inferoseptal/ defect with moderate reversibilty. There is a moderate size apical defect that is completely reversible. ?  Left ventricular function is abnormal. Nuclear stress EF: 38 %. The left ventricular ejection fraction is moderately decreased (30-44%). End diastolic cavity size is normal. ?  ?Patient Profile  ?   ?88 y.o. male w/ PMH of HLD, Type 2 DM, GERD, Stage 3 CKD, prior tobacco use and prior CVA (right thalamic infarction in 02/2020) who is currently admitted for evaluation of chest pain.  ? ?Assessment & Plan  ?  ?1. Chest Pain with Mixed Features/Abnormal Stress Test ?- His episodes of pain on admission overall had mixed qualities as some episodes resembled his prior GERD and would improve with TUMS and a vinegar and water mixture while other episodes would occur with exertion. Hs Troponin values peaked at 46 and were trending down to 27 on most recent check. Echo showed a preserved EF of 55-60% with no regional WMA.  ?- Lexiscan Myoview performed yesterday and showed evidence of prior inferior infarct with moderate peri-infarct ischemia and moderate apical ischemia with borderline TID of 1.25 and overall a high-risk study. Dr. Harl Bowie reviewed findings with the patient and he was in agreement with a cardiac catheterization  for definitive evaluation. Risks and benefits previously reviewed and he is in agreement to proceed. Scheduled for later this afternoon. Will remain on the Hospitalist service at [facility]  ?- Remains on Plavix and statin therapy. Will add ASA 81mg  daily.  ? ?2. HLD ?- Followed by his PCP as an outpatient and he has been continued on Atorvastatin 10mg  daily. Will recheck FLP with AM labs  as he may require further titration of this pending his catheterization results.  ?  ?3. Type 2 DM ?- Hgb A1c at 6.3 this admission. Management per the admitting team.  ?  ?4. Stage 3 CKD ?- Baseline creatinine 1.5 - 1.6. Peaked at 1.85 this admission and at 1.69 today. He received IV fluids yesterday but his AM cath fluids have not yet been released and were still under signed and held orders. Will release now. No LV gram at the time of his catheterization.  ?  ?5. 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 No recent progression of symptoms. Remains on Plavix and statin therapy.  ?  ? ?For questions or updates, please contact [facility] ?Please consult [email or web address] for contact info under  ? ?  ?   ?Signed, ?Erma Heritage, PA-C  ?04/20/2022, 9:30 AM   ? ? ?Patient seen and examined   I agree with findings as noted above by B STrader   Pt is comfortable    Denies CP   ?ON exam ?Pt in in NAD ?Lungs are CTA   ?Cardiac RRR  No S3  ?Abd is supple  ?Ext are without edema ? ?Plan for L heart cath this afternoon   Tx to [facility]    ? ?Dorris Carnes MD  ?

## 2022-04-20 NOTE — Progress Notes (Signed)
?PROGRESS NOTE ? ? ? ?Martin Frazier.  YKZ:[id number] DOB: November 30, 1934 DOA: 04/17/2022 ?PCP: Suzan Slick, MD  ? ?  ?Brief Narrative:  ?Martin Frazier. is a 88 y.o. male with medical history significant of hypertension, history of stroke, GERD, diabetes mellitus type 2, and more presents ED with a chief complaint of chest pain.  He describes chest pain that starts after eating, pain has been intermittent and chronic for about 2 months.  He thought his pain was secondary to indigestion.  Pain starts on the right side of the sternum and radiates across his chest to the left and down his arm.  Has been drinking vinegar, water or taking Tums at home.  Patient admitted to the hospital for chest pain work-up.  Cardiology consulted. Stress test consistent with prior inferior/inferoseptal myocardial infarction with moderate peri-infarct ischemia. Moderate apical ischemia The study is high risk. LV perfusion is abnormal. There is a large moderate intensity inferior/inferoseptal/ defect with moderate reversibilty. There is a moderate size apical defect that is completely reversible. Recommended to transfer to [facility] for heart cath.  ? ?New events last 24 hours / Subjective: ?Patient awaiting transfer to [facility].  Feeling about the same.  No worsening chest pain. ? ?Assessment & Plan: ?  ?Principal Problem: ?  Chest pain ?Active Problems: ?  Essential hypertension, benign ?  GERD (gastroesophageal reflux disease) ?  Diabetes mellitus type 2 in nonobese [facility]) ?  Chronic kidney disease, stage 3a (HCC) ?  History of stroke ?  Chronic systolic CHF (congestive heart failure) (HCC) ?  HLD (hyperlipidemia) ?  Abnormal cardiovascular stress test ? ? ?Chest pain ?-Appreciate cardiology ?-Stress test consistent with prior inferior/inferoseptal myocardial infarction with moderate peri-infarct ischemia. Moderate apical ischemia The study is high risk. LV perfusion is abnormal. There is a large moderate intensity  inferior/inferoseptal/ defect with moderate reversibilty. There is a moderate size apical defect that is completely reversible ?-Transfer to [facility] for heart cath this afternoon ? ?Chronic systolic HF ?-EF 93% ?-Does not appear fluid overloaded by exam  ? ?History of stroke ?-With residual slurred speech ?-Continue Plavix, lipitor  ? ?Diabetes mellitus ?-A1c 6.3 ?-Sliding scale insulin ? ?CKD stage IIIa ?-Baseline creatinine 1.5-1.6 ?-Stable ? ?Hyperlipidemia ?-Lipitor ? ?GERD ?-Protonix ? ?DVT prophylaxis:  ?heparin injection 5,000 Units Start: 04/18/22 0600 ?SCDs Start: 04/18/22 0107 ? ?Code Status: Full ?Family Communication: None at bedside ?Disposition Plan:  ?Status is: Inpatient ?Remains inpatient appropriate because: Heart cath today ? ? ? ?Antimicrobials:  ?Anti-infectives (From admission, onward)  ? ? None  ? ?  ? ? ? ?Objective: ?Vitals:  ? 04/19/22 0354 04/19/22 1352 04/19/22 2113 04/20/22 0359  ?BP: 132/78 (!) 114/56 (!) 123/96 127/69  ?Pulse: 79 87 78 79  ?Resp: 18 18 17 18   ?Temp: 98.6 ?F (37 ?C) (!) 97.5 ?F (36.4 ?C) 98 ?F (36.7 ?C) 98.3 ?F (36.8 ?C)  ?TempSrc:  Oral    ?SpO2: 98% 97% 99% 99%  ?Weight:      ?Height:      ? ? ?Intake/Output Summary (Last 24 hours) at 04/20/2022 0944 ?Last data filed at 04/20/2022 0900 ?Gross per 24 hour  ?Intake 722.05 ml  ?Output 300 ml  ?Net 422.05 ml  ? ? ?Filed Weights  ? 04/17/22 2039 04/18/22 0110  ?Weight: 61.2 kg 59.6 kg  ? ? ?Examination:  ?General exam: Appears calm and comfortable, hard of hearing  ?Respiratory system: Clear to auscultation. Respiratory effort normal. No respiratory distress. No conversational dyspnea.  ?  Cardiovascular system: S1 & S2 heard, RRR. No murmurs. No pedal edema. ?Gastrointestinal system: Abdomen is nondistended, soft and nontender. Normal bowel sounds heard. ?Central nervous system: Alert and oriented. No focal neurological deficits. Speech clear.  ?Extremities: Symmetric in appearance  ?Skin: No rashes, lesions or ulcers on exposed  skin  ?Psychiatry: Judgement and insight appear normal. Mood & affect appropriate.  ? ?Data Reviewed: I have personally reviewed following labs and imaging studies ? ?CBC: ?Recent Labs  ?Lab 04/17/22 ?2053 04/19/22 ?0451  ?WBC 6.1 6.8  ?HGB 12.0* 11.5*  ?HCT 37.1* 35.2*  ?MCV 100.8* 100.3*  ?PLT 184 168  ? ? ?Basic Metabolic Panel: ?Recent Labs  ?Lab 04/17/22 ?2053 04/18/22 ?[id number] 04/19/22 ?0451 04/20/22 ?[id number]  ?NA 142 141 139 139  ?K 4.3 4.0 3.7 3.9  ?CL 110 109 108 106  ?CO2 25 25 23 26   ?GLUCOSE 202* 86 108* 102*  ?BUN 34* 34* 33* 37*  ?CREATININE 1.85* 1.70* 1.64* 1.69*  ?CALCIUM 9.1 8.9 9.0 9.0  ? ? ?GFR: ?Estimated Creatinine Clearance: 26.4 mL/min (A) (by C-G formula based on SCr of 1.69 mg/dL (H)). ?Liver Function Tests: ?Recent Labs  ?Lab 04/17/22 ?2053  ?AST 26  ?ALT 19  ?ALKPHOS 140*  ?BILITOT 0.5  ?PROT 6.5  ?ALBUMIN 4.1  ? ? ?Recent Labs  ?Lab 04/17/22 ?2053  ?LIPASE 42  ? ? ?No results for input(s): AMMONIA in the last 168 hours. ?Coagulation Profile: ?No results for input(s): INR, PROTIME in the last 168 hours. ?Cardiac Enzymes: ?No results for input(s): CKTOTAL, CKMB, CKMBINDEX, TROPONINI in the last 168 hours. ?BNP (last 3 results) ?No results for input(s): PROBNP in the last 8760 hours. ?HbA1C: ?Recent Labs  ?  04/18/22 ?[id number]  ?HGBA1C 6.3*  ? ? ?CBG: ?Recent Labs  ?Lab 04/19/22 ?0730 04/19/22 ?1118 04/19/22 ?1702 04/19/22 ?2209 04/20/22 ?[id number]  ?GLUCAP 98 168* 131* 117* 103*  ? ? ?Lipid Profile: ?No results for input(s): CHOL, HDL, LDLCALC, TRIG, CHOLHDL, LDLDIRECT in the last 72 hours. ?Thyroid Function Tests: ?No results for input(s): TSH, T4TOTAL, FREET4, T3FREE, THYROIDAB in the last 72 hours. ?Anemia Panel: ?No results for input(s): VITAMINB12, FOLATE, FERRITIN, TIBC, IRON, RETICCTPCT in the last 72 hours. ?Sepsis Labs: ?No results for input(s): PROCALCITON, LATICACIDVEN in the last 168 hours. ? ?Recent Results (from the past 240 hour(s))  ?Resp Panel by RT-PCR (Flu A&B, Covid) Nasopharyngeal  Swab     Status: None  ? Collection Time: 04/17/22 10:31 PM  ? Specimen: Nasopharyngeal Swab; Nasopharyngeal(NP) swabs in vial transport medium  ?Result Value Ref Range Status  ? SARS Coronavirus 2 by RT PCR NEGATIVE NEGATIVE Final  ?  Comment: (NOTE) ?SARS-CoV-2 target nucleic acids are NOT DETECTED. ? ?The SARS-CoV-2 RNA is generally detectable in upper respiratory ?specimens during the acute phase of infection. The lowest ?concentration of SARS-CoV-2 viral copies this assay can detect is ?138 copies/mL. A negative result does not preclude SARS-Cov-2 ?infection and should not be used as the sole basis for treatment or ?other patient management decisions. A negative result may occur with  ?improper specimen collection/handling, submission of specimen other ?than nasopharyngeal swab, presence of viral mutation(s) within the ?areas targeted by this assay, and inadequate number of viral ?copies(<138 copies/mL). A negative result must be combined with ?clinical observations, patient history, and epidemiological ?information. The expected result is Negative. ? ?Fact Sheet for Patients:  ?EntrepreneurPulse.com.au ? ?Fact Sheet for Healthcare Providers:  ?IncredibleEmployment.be ? ?This test is no t yet approved or cleared by the [place]  and  ?has been authorized for detection and/or diagnosis of SARS-CoV-2 by ?FDA under an Emergency Use Authorization (EUA). This EUA will remain  ?in effect (meaning this test can be used) for the duration of the ?COVID-19 declaration under Section 564(b)(1) of the Act, 21 ?U.S.C.section 360bbb-3(b)(1), unless the authorization is terminated  ?or revoked sooner.  ? ? ?  ? Influenza A by PCR NEGATIVE NEGATIVE Final  ? Influenza B by PCR NEGATIVE NEGATIVE Final  ?  Comment: (NOTE) ?The Xpert Xpress SARS-CoV-2/FLU/RSV plus assay is intended as an aid ?in the diagnosis of influenza from Nasopharyngeal swab specimens and ?should not be used as a sole  basis for treatment. Nasal washings and ?aspirates are unacceptable for Xpert Xpress SARS-CoV-2/FLU/RSV ?testing. ? ?Fact Sheet for Patients: ?EntrepreneurPulse.com.au ? ?Fact Sheet for Healthcare Provid

## 2022-04-20 NOTE — Interval H&P Note (Signed)
History and Physical Interval Note: ? ?04/20/2022 ?5:11 PM ? ?Martin Boortz Bowron Jr.  has presented today for surgery, with the diagnosis of CHEST PAIN / & abnormal stress test.  The various methods of treatment have been discussed with the patient and family. After consideration of risks, benefits and other options for treatment, the patient has consented to  Procedure(s): ?LEFT HEART CATH AND CORONARY ANGIOGRAPHY (N/A)  ?PERCUTANEOUS CORONARY INTERVENTION ? ?as a surgical intervention.  The patient's history has been reviewed, patient examined, no change in status, stable for surgery.  I have reviewed the patient's chart and labs.  Questions were answered to the patient's satisfaction.   ? ?Cath Lab Visit (complete for each Cath Lab visit) ? ?Clinical Evaluation Leading to the Procedure:  ? ?ACS: No. ? ?Non-ACS:   ? ?Anginal Classification: CCS III ? ?Anti-ischemic medical therapy: No Therapy ? ?Non-Invasive Test Results: High-risk stress test findings: cardiac mortality >3%/year ? ?Prior CABG: No previous CABG ? ? ?Glenetta Hew ? ? ?

## 2022-04-20 NOTE — Progress Notes (Signed)
Pt admitted to unit from cath lab. Alert and oriented . HOH. Reviewed plan of care, VSS. Right radial site level 0. Patient denies chest pain or left arm pain . Resting quietly ?

## 2022-04-20 NOTE — Care Management Important Message (Signed)
Important Message ? ?Patient Details  ?Name: Martin Frazier. ?MRN: [id number] ?Date of Birth: 03-Jul-1934 ? ? ?Medicare Important Message Given:  Yes ? ? ? ? ?Corey Harold ?04/20/2022, 11:52 AM ?

## 2022-04-21 DIAGNOSIS — I200 Unstable angina: Secondary | ICD-10-CM

## 2022-04-21 LAB — LIPID PANEL
Cholesterol: 110 mg/dL (ref 0–200)
HDL: 50 mg/dL (ref >40)
LDL Cholesterol: 48 mg/dL (ref 0–99)
Total CHOL/HDL Ratio: 2.2 RATIO
Triglycerides: 60 mg/dL (ref <150)
VLDL: 12 mg/dL (ref 0–40)

## 2022-04-21 LAB — CBC
HCT: 33.4 % — ABNORMAL LOW (ref 39.0–52.0)
Hemoglobin: 11.3 g/dL — ABNORMAL LOW (ref 13.0–17.0)
MCH: 33.6 pg (ref 26.0–34.0)
MCHC: 33.8 g/dL (ref 30.0–36.0)
MCV: 99.4 fL (ref 80.0–100.0)
Platelets: 159 10*3/uL (ref 150–400)
RBC: 3.36 MIL/uL — ABNORMAL LOW (ref 4.22–5.81)
RDW: 13.1 % (ref 11.5–15.5)
WBC: 5.6 10*3/uL (ref 4.0–10.5)
nRBC: 0 % (ref 0.0–0.2)

## 2022-04-21 LAB — BASIC METABOLIC PANEL
Anion gap: 8 (ref 5–15)
BUN: 29 mg/dL — ABNORMAL HIGH (ref 8–23)
CO2: 22 mmol/L (ref 22–32)
Calcium: 8.8 mg/dL — ABNORMAL LOW (ref 8.9–10.3)
Chloride: 107 mmol/L (ref 98–111)
Creatinine, Ser: 1.72 mg/dL — ABNORMAL HIGH (ref 0.61–1.24)
GFR, Estimated: 38 mL/min — ABNORMAL LOW (ref >60)
Glucose, Bld: 95 mg/dL (ref 70–99)
Potassium: 3.8 mmol/L (ref 3.5–5.1)
Sodium: 137 mmol/L (ref 135–145)

## 2022-04-21 LAB — GLUCOSE, CAPILLARY: Glucose-Capillary: 94 mg/dL (ref 70–99)

## 2022-04-21 MED ORDER — CLOPIDOGREL BISULFATE 75 MG PO TABS: 75.0000 mg | ORAL_TABLET | Freq: Every day | ORAL | 1 refills | Status: DC

## 2022-04-21 MED ORDER — NITROGLYCERIN 0.4 MG SL SUBL: 0.4000 mg | SUBLINGUAL_TABLET | SUBLINGUAL | 0 refills | Status: DC | PRN

## 2022-04-21 MED ORDER — ATORVASTATIN CALCIUM 80 MG PO TABS: 80.0000 mg | ORAL_TABLET | Freq: Every day | ORAL | 1 refills | Status: DC

## 2022-04-21 MED ORDER — ASPIRIN 81 MG PO TBEC
81.0000 mg | DELAYED_RELEASE_TABLET | Freq: Every day | ORAL | 1 refills | Status: DC
Start: 2022-04-22 — End: 2023-04-13

## 2022-04-21 NOTE — Discharge Summary (Addendum)
Physician Discharge Summary  ?[place] [id number] DOB: Nov 17, 1934 DOA: 04/17/2022 ? ?PCP: Leeanne Rio, MD ? ?Admit date: 04/17/2022 ?Discharge date: 04/21/2022 ? ?Admitted From: Home ?Disposition:  Home ? ?Discharge Condition:Stable ?CODE STATUS:FULL ?Diet recommendation: Heart Healthy   ? ?Brief/Interim Summary: ? ?Eldwin Doran. is a 88 y.o. male with medical history significant of hypertension, history of stroke, GERD, diabetes mellitus type 2, and more presents ED with a chief complaint of chest pain.  Patient admitted to the hospital for chest pain work-up.  Cardiology consulted. Stress test consistent with prior inferior/inferoseptal myocardial infarction with moderate peri-infarct ischemia.  There was a large moderate intensity inferior/inferoseptal/ defect with moderate reversibilty. There was a moderate size apical defect that is completely reversible. Recommended to transfer to [facility] for heart cath.  Underwent cardiac cath on 4/21 which showed 90% stenosis of proximal LAD, 80% stenosis of mid LAD.  Underwent placement of drug-eluting stent.  Cardiology recommended aspirin and Plavix and follow-up as an outpatient.  He is chest pain-free today.  Medically stable for discharge. ? ?Following problems were addressed during his hospitalization: ? ?Chest pain ?--Stress test was consistent with reversible ischemia ?-Transferred to [facility] for heart cath, management as above ?-He will follow-up with cardiology in 2 weeks ?  ?Chronic systolic HF ?-Echo done on 04/18/2022 showed EF of 55 to 123456, grade 1 diastolic dysfunction.  But nuclear stress showed EF of 38% ?-Appears euvolemic ? ?History of stroke ?-With residual slurred speech ?-Continue aspirin, Plavix, Lipitor.  Dose of Lipitor increased to 80 mg daily ? ?Diabetes mellitus ?-A1c 6.3 ?-Continue home regimen ?  ?CKD stage IIIa ?-Baseline creatinine 1.5-1.6 ?-Stable ?  ?Hyperlipidemia ?-Lipitor ? ?GERD ?-Protonix ?  ? ? ? ?Discharge Diagnoses:   ?Principal Problem: ?  Chest pain ?Active Problems: ?  Essential hypertension, benign ?  GERD (gastroesophageal reflux disease) ?  Diabetes mellitus type 2 in nonobese [facility]) ?  Chronic kidney disease, stage 3a ([place]) ?  History of stroke ?  Chronic systolic CHF (congestive heart failure) ([place]) ?  HLD (hyperlipidemia) ?  Abnormal cardiovascular stress test ? ? ? ?Discharge Instructions ? ?Discharge Instructions   ? ? Amb Referral to Cardiac Rehabilitation   Complete by: As directed ?  ? Diagnosis: Coronary Stents  ? After initial evaluation and assessments completed: Virtual Based Care may be provided alone or in conjunction with Phase 2 Cardiac Rehab based on patient barriers.: Yes  ? Diet - low sodium heart healthy   Complete by: As directed ?  ? Discharge instructions   Complete by: As directed ?  ? 1)Please take prescribed medications as instructed ?2)Follow up with cardiology, Dr. Harl Bowie, in 2 weeks ?3)Follow up with your PCP in a week  ? Increase activity slowly   Complete by: As directed ?  ? ?  ? ?Allergies as of 04/21/2022   ? ?   Reactions  ? Sulfa Antibiotics Nausea And Vomiting  ? ?  ? ?  ?Medication List  ?  ? ?STOP taking these medications   ? ?loratadine 10 MG tablet ?Commonly known as: CLARITIN ?  ?omeprazole 40 MG capsule ?Commonly known as: PRILOSEC ?  ? ?  ? ?TAKE these medications   ? ?acetaminophen 325 MG tablet ?Commonly known as: TYLENOL ?Take 2 tablets (650 mg total) by mouth every 4 (four) hours as needed for mild pain (or temp > 37.5 C (99.5 F)). ?  ?aspirin 81 MG EC tablet ?Take 1 tablet (81 mg total)  by mouth daily. Swallow whole. ?Start taking on: April 22, 2022 ?  ?atorvastatin 80 MG tablet ?Commonly known as: LIPITOR ?Take 1 tablet (80 mg total) by mouth daily. ?Start taking on: April 22, 2022 ?What changed:  ?medication strength ?how much to take ?when to take this ?  ?clopidogrel 75 MG tablet ?Commonly known as: PLAVIX ?Take 1 tablet (75 mg total) by mouth daily. ?  ?glimepiride 1 MG  tablet ?Commonly known as: AMARYL ?Take 1 tablet (1 mg total) by mouth daily with breakfast. ?  ?latanoprost 0.005 % ophthalmic solution ?Commonly known as: XALATAN ?Place 1 drop into both eyes at bedtime. ?  ?Magnesium 250 MG Tabs ?Take 1 tablet by mouth daily. ?  ?nitroGLYCERIN 0.4 MG SL tablet ?Commonly known as: Nitrostat ?Place 1 tablet (0.4 mg total) under the tongue every 5 (five) minutes as needed for chest pain. ?  ?ONE-A-DAY 50 PLUS PO ?Take by mouth. ?  ?pantoprazole 40 MG tablet ?Commonly known as: PROTONIX ?Take 40 mg by mouth daily. ?  ?tamsulosin 0.4 MG Caps capsule ?Commonly known as: FLOMAX ?Take 1 capsule (0.4 mg total) by mouth daily. ?  ?vitamin B-12 1000 MCG tablet ?Commonly known as: CYANOCOBALAMIN ?Take 1 tablet (1,000 mcg total) by mouth daily. ?  ?Vitamin D 50 MCG (2000 UT) Caps ?Take 1 capsule (2,000 Units total) by mouth daily. ?  ? ?  ? ? Follow-up Information   ? ? Leeanne Rio, MD. Schedule an appointment as soon as possible for a visit in 1 week(s).   ?Specialty: Family Medicine ?Contact information: ?[address] ?[address] ?[place] [address] ?[phone] ? ? ?  ?  ? ? Arnoldo Lenis, MD. Schedule an appointment as soon as possible for a visit in 2 week(s).   ?Specialty: Cardiology ?Contact information: ?[address] ?[place] [address] ?[phone] ? ? ?  ?  ? ?  ?  ? ?  ? ?Allergies  ?Allergen Reactions  ? Sulfa Antibiotics Nausea And Vomiting  ? ? ?Consultations: ?Cardiology ? ? ?Procedures/Studies: ?CARDIAC CATHETERIZATION ? ?Result Date: 04/20/2022 ?  CULPRIT LESION: Prox LAD lesion is 90% stenosed (ulcerated).  Mid LAD lesion is 80% stenosed.   A drug-eluting stent was successfully placed covering both lesions, using a STENT ONYX FRONTIER 2.75X30.  Stent was postdilated to 3.1 mm proximally tapered to 2.9 mm distally   Post intervention, there is a 0% residual stenosis of the stent.Marland Kitchen   -------------------------------------------------   Ramus lesion is 80%  stenosed. -Would be PCI amenable, but may not be causing symptoms.  Would recommend medical management at this time.  If symptoms warrant, can consider staged PCI.   Small caliber (<2 mm ) RCA: Prox RCA to Mid RCA lesion is 90% stenosed. RPAV lesion is 100% stenosed (fills via left to right collaterals)   Mid LAD lesion is 80% stenosed.   -------------------------------------------------   LV end diastolic pressure is moderately elevated.   There is no aortic valve stenosis. SUMMARY Severe Three-Vessel CAD: Very small caliber (however dominant) RCA with mid 90% stenosis followed by CTO (flush occlusion) of RPA V with 2 PL branches filling via collaterals from the LCx (PDA also has retrograde filling from septal perforator branches) Normal caliber LAD with ulcerated 90% proximal LAD (before SP1) followed by sequential 80% lesion just beyond SP1 with then mild diffuse disease distally.  (Likely culprit lesion) Successful DES PCI of proximal LAD covering both 90 and 80% lesions using Onyx Frontier DES 2.75 mm x  30 mm postdilated to 3.1 mm 80% Focal Ramus Intermedius stenosis (small-moderate caliber vessel, would consider medical therapy following LAD PCI, however if he were to have symptoms either tomorrow or in the outpatient setting would consider PCI) Relatively normal LCx Moderately elevated LVEDP RECOMMENDATIONS Transferred to 6 E. cardiac telemetry unit for overnight monitoring post PCI.  Expect discharge tomorrow. For now would recommend medical therapy for the Ramus Intermediate lesion, as there is no evidence of ischemia in that distribution on Myoview.  However if he were to have concerning symptoms either prior to discharge or in the outpatient setting, would be a potential target for PCI. Continue aggressive Cardiovascular Risk Reduction with Guideline Directed Medical Therapy per primary team.. Glenetta Hew, MD ? ?NM Myocar Multi W/Spect W/Wall Motion / EF ? ?Result Date: 04/19/2022 ?  Findings are  consistent with prior inferior/inferoseptal myocardial infarction with moderate peri-infarct ischemia. Moderate apical ischemia The study is high risk. High risk based on at least moderate ischemia in multiple areas. Border

## 2022-04-21 NOTE — TOC Transition Note (Signed)
Transition of Care (TOC) - CM/SW Discharge Note ? ? ?Patient Details  ?Name: Martin Frazier. ?MRN: [id number] ?Date of Birth: 1934-02-04 ? ?Transition of Care (TOC) CM/SW Contact:  ?Konrad Penta, RN ?Phone Number: [phone] ?04/21/2022, 11:27 AM ? ? ?Clinical Narrative:   Spoke with patient's son Martin Frazier prior to [name] transition home. Martin Frazier reports patient lives at home with spouse. Has DME at home such as walker and cane. Martin Frazier reports patient also has [employer] who provides additional benefits and equipment if needed. Denies having any TOC needs at this time. Martin Frazier will provide transportation home today.  ? ?No TOC needs identified. Please consult if TOC needs arise.  ? ? ? ?Final next level of care: Home/Self Care ?Barriers to Discharge: No Barriers Identified ? ? ?Patient Goals and CMS Choice ?Patient states their goals for this hospitalization and ongoing recovery are:: return home ?  ?  ? ?Discharge Placement ?  ?           ?  ?  ?  ?  ? ?Discharge Plan and Services ?  ?  ?           ?  ?  ?  ?  ?  ?  ?  ?  ?  ?  ? ?Social Determinants of Health (SDOH) Interventions ?  ? ? ?Readmission Risk Interventions ?   ? View : No data to display.  ?  ?  ?  ? ? ? ? ? ?

## 2022-04-21 NOTE — Progress Notes (Signed)
? ?Progress Note ? ?Patient Name: Martin Frazier. ?Date of Encounter: 04/21/2022 ? ?Primary Cardiologist: Dina Rich, MD  ? ?Subjective  ? ?No chest pain or sob.  ? ?Inpatient Medications  ?  ?Scheduled Meds: ? aspirin EC  81 mg Oral Daily  ? atorvastatin  10 mg Oral Daily  ? clopidogrel  75 mg Oral Daily  ? feeding supplement  237 mL Oral BID BM  ? heparin  5,000 Units Subcutaneous Q8H  ? insulin aspart  0-15 Units Subcutaneous TID WC  ? insulin aspart  0-5 Units Subcutaneous QHS  ? pantoprazole  40 mg Oral Daily  ? sodium chloride flush  3 mL Intravenous Q12H  ? sodium chloride flush  3 mL Intravenous Q12H  ? tamsulosin  0.4 mg Oral Daily  ? vitamin B-12  1,000 mcg Oral Daily  ? ?Continuous Infusions: ? sodium chloride    ? ?PRN Meds: ?sodium chloride, acetaminophen, morphine injection, ondansetron (ZOFRAN) IV, oxyCODONE, sodium chloride flush  ? ?Vital Signs  ?  ?Vitals:  ? 04/21/22 0537 04/21/22 0715 04/21/22 0815 04/21/22 0845  ?BP: 117/90 (!) 157/67 (!) 121/58 123/65  ?Pulse: 85 85 85 83  ?Resp:   16   ?Temp: 97.6 ?F (36.4 ?C)  98 ?F (36.7 ?C)   ?TempSrc: Oral  Oral   ?SpO2: 96% 96% 95% 94%  ?Weight:      ?Height:      ? ? ?Intake/Output Summary (Last 24 hours) at 04/21/2022 0949 ?Last data filed at 04/21/2022 9728 ?Gross per 24 hour  ?Intake 240 ml  ?Output 400 ml  ?Net -160 ml  ? ?Filed Weights  ? 04/17/22 2039 04/18/22 0110  ?Weight: 61.2 kg 59.6 kg  ? ? ?Telemetry  ?  ?nsr - Personally Reviewed ? ?ECG  ?  ?noen - Personally Reviewed ? ?Physical Exam  ? ?GEN: No acute distress.   ?Neck: No JVD ?Cardiac: RRR, no murmurs, rubs, or gallops.  ?Respiratory: Clear to auscultation bilaterally. ?GI: Soft, nontender, non-distended  ?MS: No edema; No deformity. ?Neuro:  Nonfocal  ?Psych: Normal affect  ? ?Labs  ?  ?Chemistry ?Recent Labs  ?Lab 04/17/22 ?2053 04/18/22 ?2060 04/19/22 ?0451 04/20/22 ?1561 04/21/22 ?0325  ?NA 142   < > 139 139 137  ?K 4.3   < > 3.7 3.9 3.8  ?CL 110   < > 108 106 107  ?CO2 25   < > 23  26 22   ?GLUCOSE 202*   < > 108* 102* 95  ?BUN 34*   < > 33* 37* 29*  ?CREATININE 1.85*   < > 1.64* 1.69* 1.72*  ?CALCIUM 9.1   < > 9.0 9.0 8.8*  ?PROT 6.5  --   --   --   --   ?ALBUMIN 4.1  --   --   --   --   ?AST 26  --   --   --   --   ?ALT 19  --   --   --   --   ?ALKPHOS 140*  --   --   --   --   ?BILITOT 0.5  --   --   --   --   ?GFRNONAA 35*   < > 40* 39* 38*  ?ANIONGAP 7   < > 8 7 8   ? < > = values in this interval not displayed.  ?  ? ?Hematology ?Recent Labs  ?Lab 04/17/22 ?2053 04/19/22 ?0451 04/21/22 ?0325  ?WBC 6.1 6.8 5.6  ?  RBC 3.68* 3.51* 3.36*  ?HGB 12.0* 11.5* 11.3*  ?HCT 37.1* 35.2* 33.4*  ?MCV 100.8* 100.3* 99.4  ?MCH 32.6 32.8 33.6  ?MCHC 32.3 32.7 33.8  ?RDW 13.2 13.3 13.1  ?PLT 184 168 159  ? ? ?Cardiac EnzymesNo results for input(s): TROPONINI in the last 168 hours. No results for input(s): TROPIPOC in the last 168 hours.  ? ?BNP ?Recent Labs  ?Lab 04/17/22 ?2313  ?BNP 177.0*  ?  ? ?DDimer No results for input(s): DDIMER in the last 168 hours.  ? ?Radiology  ?  ?CARDIAC CATHETERIZATION ? ?Result Date: 04/20/2022 ?  CULPRIT LESION: Prox LAD lesion is 90% stenosed (ulcerated).  Mid LAD lesion is 80% stenosed.   A drug-eluting stent was successfully placed covering both lesions, using a STENT ONYX FRONTIER 2.75X30.  Stent was postdilated to 3.1 mm proximally tapered to 2.9 mm distally   Post intervention, there is a 0% residual stenosis of the stent.Marland Kitchen   -------------------------------------------------   Ramus lesion is 80% stenosed. -Would be PCI amenable, but may not be causing symptoms.  Would recommend medical management at this time.  If symptoms warrant, can consider staged PCI.   Small caliber (<2 mm ) RCA: Prox RCA to Mid RCA lesion is 90% stenosed. RPAV lesion is 100% stenosed (fills via left to right collaterals)   Mid LAD lesion is 80% stenosed.   -------------------------------------------------   LV end diastolic pressure is moderately elevated.   There is no aortic valve stenosis.  SUMMARY Severe Three-Vessel CAD: Very small caliber (however dominant) RCA with mid 90% stenosis followed by CTO (flush occlusion) of RPA V with 2 PL branches filling via collaterals from the LCx (PDA also has retrograde filling from septal perforator branches) Normal caliber LAD with ulcerated 90% proximal LAD (before SP1) followed by sequential 80% lesion just beyond SP1 with then mild diffuse disease distally.  (Likely culprit lesion) Successful DES PCI of proximal LAD covering both 90 and 80% lesions using Onyx Frontier DES 2.75 mm x 30 mm postdilated to 3.1 mm 80% Focal Ramus Intermedius stenosis (small-moderate caliber vessel, would consider medical therapy following LAD PCI, however if he were to have symptoms either tomorrow or in the outpatient setting would consider PCI) Relatively normal LCx Moderately elevated LVEDP RECOMMENDATIONS Transferred to 6 E. cardiac telemetry unit for overnight monitoring post PCI.  Expect discharge tomorrow. For now would recommend medical therapy for the Ramus Intermediate lesion, as there is no evidence of ischemia in that distribution on Myoview.  However if he were to have concerning symptoms either prior to discharge or in the outpatient setting, would be a potential target for PCI. Continue aggressive Cardiovascular Risk Reduction with Guideline Directed Medical Therapy per primary team.. Bryan Lemma, MD ? ?NM Myocar Multi W/Spect W/Wall Motion / EF ? ?Result Date: 04/19/2022 ?  Findings are consistent with prior inferior/inferoseptal myocardial infarction with moderate peri-infarct ischemia. Moderate apical ischemia The study is high risk. High risk based on at least moderate ischemia in multiple areas. Borderline TID 1.25 may suggest component of balanced ischemia   No ST deviation was noted.   LV perfusion is abnormal. There is a large moderate intensity inferior/inferoseptal/ defect with moderate reversibilty. There is a moderate size apical defect that is  completely reversible.   Left ventricular function is abnormal. Nuclear stress EF: 38 %. The left ventricular ejection fraction is moderately decreased (30-44%). End diastolic cavity size is normal.   ? ?Cardiac Studies  ? ?See above ? ?Patient Profile  ?   ?  88 y.o. male admitted with [place] ? ?Assessment & Plan  ?  ?[place] - he is doing well after PCI of the LAD. Continue medical therapy with ASA, Plavix and high internsity statin therapy ?Dyslipidemia - was on 10 mg of lipitor. Increase to 80 mg at discharge ?[facility] will sign off.   ?Medication Recommendations:  increase lipitor to 80 mg daily ?Other recommendations (labs, testing, etc):  check bmp in a week ?Follow up as an outpatient:  Dr. Wyline Mood or Randall An, PA in 2-3 weeks ? ?For questions or updates, please contact [facility] ?Please consult [email or web address] for contact info under Cardiology/STEMI. ?  ?   ?Signed, ?Lewayne Bunting, MD  ?04/21/2022, 9:49 AM    ?

## 2022-04-21 NOTE — Progress Notes (Signed)
CARDIAC REHAB PHASE I  ? ?PRE:  Rate/Rhythm: 81 SR ? ?  BP: sitting 143/53 ? ?  SaO2: 97 RA ? ?MODE:  Ambulation: 200 ft  ? ?POST:  Rate/Rhythm: 105 ST ? ?  BP: sitting 156/70  ? ?  SaO2: 98 RA ? ?1100-1155 ?Son at bedside. Patient HOH. DC today. Education done with patient, son, and wife via speaker phone. Wife has stent card. Agrees to phase 2. Referral to AP. Antiplatelet, nutrition, and exercise reviewed. Patient ambulated in hallway x 1 assist with RW. Denied complaints.   ? ?Tina Griffiths RN, BSN ? ?

## 2022-04-21 NOTE — Plan of Care (Signed)
?  Problem: Pain Managment: Goal: General experience of comfort will improve Outcome: Progressing   Problem: Activity: Goal: Ability to return to baseline activity level will improve Outcome: Progressing   Problem: Cardiovascular: Goal: Ability to achieve and maintain adequate cardiovascular perfusion will improve Outcome: Progressing Goal: Vascular access site(s) Level 0-1 will be maintained Outcome: Progressing   

## 2022-04-23 ENCOUNTER — Telehealth: Payer: Self-pay

## 2022-04-23 ENCOUNTER — Encounter (HOSPITAL_COMMUNITY): Payer: Self-pay | Admitting: Cardiology

## 2022-04-23 NOTE — Telephone Encounter (Signed)
-----   Message from Leone Brand, NP sent at 04/21/2022 11:31 AM EDT ----- ?TOC appt and call thanks ? ?

## 2022-04-23 NOTE — Telephone Encounter (Signed)
Spoke with wife for TOC. ?Patient contacted regarding discharge from [facility] on 04/21/22. ? ?Patient understands to follow up with provider Dina Rich on 05/07/22 at 1:00 pm at [facility].  PCP on 04/25/22 ?Patient understands discharge instructions? Yes. ?Patient understands medications and regiment? Yes. ?Patient understands to bring all medications to this visit? Yes. ? ?Ask patient:  Are you enrolled in My Chart? No. Would not like to enroll. ? ?Rt radial and rt groin sites clear. No tingling/numbness in either limb. ? ? ? ?

## 2022-04-23 NOTE — Telephone Encounter (Signed)
Patient's wife returning call. 

## 2022-04-23 NOTE — Telephone Encounter (Signed)
LMTCB for TOC. 

## 2022-05-07 ENCOUNTER — Ambulatory Visit: Payer: Medicare Other | Admitting: Cardiology

## 2022-05-09 ENCOUNTER — Encounter: Payer: Self-pay | Admitting: Cardiology

## 2022-05-09 ENCOUNTER — Other Ambulatory Visit: Payer: Self-pay

## 2022-05-09 ENCOUNTER — Ambulatory Visit (INDEPENDENT_AMBULATORY_CARE_PROVIDER_SITE_OTHER): Payer: Medicare Other | Admitting: Cardiology

## 2022-05-09 VITALS — BP 122/64 | HR 82 | Ht 63.0 in | Wt 137.6 lb

## 2022-05-09 DIAGNOSIS — I2510 Atherosclerotic heart disease of native coronary artery without angina pectoris: Secondary | ICD-10-CM | POA: Diagnosis not present

## 2022-05-09 DIAGNOSIS — E782 Mixed hyperlipidemia: Secondary | ICD-10-CM

## 2022-05-09 MED ORDER — ATORVASTATIN CALCIUM 80 MG PO TABS: 80.0000 mg | ORAL_TABLET | Freq: Every day | ORAL | 3 refills | Status: DC

## 2022-05-09 NOTE — Patient Instructions (Signed)
Medication Instructions:  ?No changes ? ?Labwork: ?None ? ?Testing/Procedures: ?None ? ?Follow-Up: ?Follow up in 6 months with Dr. Wyline Mood.   ? ?Any Other Special Instructions Will Be Listed Below (If Applicable). ? ? ? ? ?If you need a refill on your cardiac medications before your next appointment, please call your pharmacy. ? ?

## 2022-05-09 NOTE — Progress Notes (Signed)
? ? ? ?Clinical Summary ?Mr. Martin Frazier is a 88 y.o.male seen today for follow up of the following medical problems.  ? ?1.CAD/chest pain ?- admit 03/2022 with chest pain ?- Mild peak trop of 46 but did have a crescendo/decrescendo pattern suggesting possible ischemia. EKG without specific acute ischemic changes, LVEF 55-60% no WMAs. Lexiscan this AM moderate infero/inferoseptal infarct with moderate peri-infarct inschemia, moderate apical ischemia as apical defect is completely reversible, borderline TID 1.25 ? ?- 04/20/22 cath: LM mild disease, LAD prox 90% and mid 80%, ramus 80%, LCX luminal irreg, RCA small prox to mid 90%, RPAV occluded with left to right collaterals ?- DES to LAD. Ramus disease managed medically to limit contrast but could consider intervention pending symptoms.  ? ?- no recent chest pain ?- compliant with meds ? ?2.Hyperlipidemia ?- atorva increased from 10 to 80mg  during recent admission ? ? ?3. DM2 ? ? ?4. CKD 3 ? ?5. 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 ?- had been on longterm plavix, now on DAPT after recent stent.  ? ?Past Medical History:  ?Diagnosis Date  ? Collapsed lung   ? left  ? Diabetes mellitus without complication ([place])   ? over 10 yrs  ? GERD (gastroesophageal reflux disease)   ? History of CVA (cerebrovascular accident)   ? Hypertension   ? Kidney stones   ? ? ? ?Allergies  ?Allergen Reactions  ? Sulfa Antibiotics Nausea And Vomiting  ? ? ? ?Current Outpatient Medications  ?Medication Sig Dispense Refill  ? acetaminophen (TYLENOL) 325 MG tablet Take 2 tablets (650 mg total) by mouth every 4 (four) hours as needed for mild pain (or temp > 37.5 C (99.5 F)).    ? aspirin EC 81 MG EC tablet Take 1 tablet (81 mg total) by mouth daily. Swallow whole. 30 tablet 1  ? atorvastatin (LIPITOR) 80 MG tablet Take 1 tablet (80 mg total) by mouth daily. 30 tablet 1  ? Cholecalciferol (VITAMIN D) 50 MCG  (2000 UT) CAPS Take 1 capsule (2,000 Units total) by mouth daily. 30 capsule 0  ? clopidogrel (PLAVIX) 75 MG tablet Take 1 tablet (75 mg total) by mouth daily. 30 tablet 1  ? glimepiride (AMARYL) 1 MG tablet Take 1 tablet (1 mg total) by mouth daily with breakfast. 30 tablet 0  ? latanoprost (XALATAN) 0.005 % ophthalmic solution Place 1 drop into both eyes at bedtime. 2.5 mL 12  ? Magnesium 250 MG TABS Take 1 tablet by mouth daily.    ? Multiple Vitamins-Minerals (ONE-A-DAY 50 PLUS PO) Take by mouth.    ? nitroGLYCERIN (NITROSTAT) 0.4 MG SL tablet Place 1 tablet (0.4 mg total) under the tongue every 5 (five) minutes as needed for chest pain. 30 tablet 0  ? pantoprazole (PROTONIX) 40 MG tablet Take 40 mg by mouth daily.    ? tamsulosin (FLOMAX) 0.4 MG CAPS capsule Take 1 capsule (0.4 mg total) by mouth daily. 30 capsule 0  ? vitamin B-12 (CYANOCOBALAMIN) 1000 MCG tablet Take 1 tablet (1,000 mcg total) by mouth daily. (Patient not taking: Reported on 04/17/2022) 30 tablet 0  ? ?No current facility-administered medications for this visit.  ? ? ? ?Past Surgical History:  ?Procedure Laterality Date  ? APPENDECTOMY    ? BIOPSY  04/01/2019  ? Procedure: BIOPSY;  Surgeon: Rogene Houston, MD;  [facility]: [facility];  Service: Endoscopy;;  gastric  ?  CORONARY STENT INTERVENTION N/A 04/20/2022  ? Procedure: CORONARY STENT INTERVENTION;  Surgeon: Leonie Man, MD;  Location: [facility];  Service: Cardiovascular;  Laterality: N/A;  ? CYSTOSCOPY W/ URETERAL STENT PLACEMENT Right 04/03/2019  ? Procedure: CYSTOSCOPY WITH RETROGRADE PYELOGRAM/URETERAL STENT PLACEMENT;  Surgeon: Franchot Gallo, MD;  Location: [facility];  Service: Urology;  Laterality: Right;  ? ESOPHAGOGASTRODUODENOSCOPY N/A 04/01/2019  ? Procedure: ESOPHAGOGASTRODUODENOSCOPY (EGD);  Surgeon: Rogene Houston, MD;  [facility]: [facility];  Service: Endoscopy;  Laterality: N/A;  10:55am  ? EXTRACORPOREAL SHOCK WAVE LITHOTRIPSY Right 04/16/2019  ? Procedure:  EXTRACORPOREAL SHOCK WAVE LITHOTRIPSY (ESWL);  Surgeon: Festus Aloe, MD;  Location: [facility];  Service: Urology;  Laterality: Right;  ? HEMORRHOID SURGERY    ? KIDNEY STONE SURGERY    ? LEFT HEART CATH AND CORONARY ANGIOGRAPHY N/A 04/20/2022  ? Procedure: LEFT HEART CATH AND CORONARY ANGIOGRAPHY;  Surgeon: Leonie Man, MD;  Location: [facility];  Service: Cardiovascular;  Laterality: N/A;  ? TONSILLECTOMY    ? ? ? ?Allergies  ?Allergen Reactions  ? Sulfa Antibiotics Nausea And Vomiting  ? ? ? ? ?Family History  ?Problem Relation Age of Onset  ? Heart attack Father   ? ? ? ?Social History ?Mr. Martin Frazier reports that he has quit smoking. His smoking use included cigarettes. He has never used smokeless tobacco. ?Mr. Martin Frazier reports no history of alcohol use. ? ? ?Review of Systems ?CONSTITUTIONAL: No weight loss, fever, chills, weakness or fatigue.  ?HEENT: Eyes: No visual loss, blurred vision, double vision or yellow sclerae.No hearing loss, sneezing, congestion, runny nose or sore throat.  ?SKIN: No rash or itching.  ?CARDIOVASCULAR: per hpi ?RESPIRATORY: No shortness of breath, cough or sputum.  ?GASTROINTESTINAL: No anorexia, nausea, vomiting or diarrhea. No abdominal pain or blood.  ?GENITOURINARY: No burning on urination, no polyuria ?NEUROLOGICAL: No headache, dizziness, syncope, paralysis, ataxia, numbness or tingling in the extremities. No change in bowel or bladder control.  ?MUSCULOSKELETAL: No muscle, back pain, joint pain or stiffness.  ?LYMPHATICS: No enlarged nodes. No history of splenectomy.  ?PSYCHIATRIC: No history of depression or anxiety.  ?ENDOCRINOLOGIC: No reports of sweating, cold or heat intolerance. No polyuria or polydipsia.  ?. ? ? ?Physical Examination ?Today's Vitals  ? 05/09/22 1242  ?BP: 122/64  ?Pulse: 82  ?SpO2: 95%  ?Weight: 137 lb 9.6 oz (62.4 kg)  ?Height: 5\' 3"  (1.6 m)  ? ?Body mass index is 24.37 kg/m?. ? ?Gen: resting comfortably, no acute distress ?HEENT: no scleral  icterus, pupils equal round and reactive, no palptable cervical adenopathy,  ?CV: RRR, no m/r/g no jvd ?Resp: Clear to auscultation bilaterally ?GI: abdomen is soft, non-tender, non-distended, normal bowel sounds, no hepatosplenomegaly ?MSK: extremities are warm, no edema.  ?Skin: warm, no rash ?Neuro:  no focal deficits ?Psych: appropriate affect ? ? ?Diagnostic Studies ? ?03/2022 echo ? 1. Left ventricular ejection fraction, by estimation, is 55 to 60%. The  ?left ventricle has normal function. The left ventricle has no regional  ?wall motion abnormalities. There is mild left ventricular hypertrophy.  ?Left ventricular diastolic parameters  ?are consistent with Grade I diastolic dysfunction (impaired relaxation).  ? 2. Right ventricular systolic function is normal. The right ventricular  ?size is normal. There is normal pulmonary artery systolic pressure.  ? 3. The mitral valve is abnormal. Mild mitral valve regurgitation. No  ?evidence of mitral stenosis.  ? 4. The aortic valve is tricuspid. There is mild calcification of the  ?  aortic valve. There is mild thickening of the aortic valve. Aortic valve  ?regurgitation is mild. No aortic stenosis is present.  ? 5. The inferior vena cava is normal in size with greater than 50%  ?respiratory variability, suggesting right atrial pressure of 3 mmHg.  ? ?03/2022 nuclear stress ?Findings are consistent with prior inferior/inferoseptal myocardial infarction with moderate peri-infarct ischemia. Moderate apical ischemia The study is high risk. High risk based on at least moderate ischemia in multiple areas. Borderline TID 1.25 may suggest component of balanced ischemia ?  No ST deviation was noted. ?  LV perfusion is abnormal. There is a large moderate intensity inferior/inferoseptal/ defect with moderate reversibilty. There is a moderate size apical defect that is completely reversible. ?  Left ventricular function is abnormal. Nuclear stress EF: 38 %. The left ventricular  ejection fraction is moderately decreased (30-44%). End diastolic cavity size is normal. ? ?03/2022 cath ?CULPRIT LESION: Prox LAD lesion is 90% stenosed (ulcerated).  Mid LAD lesion is 80% stenosed. ?  A drug-elu

## 2022-09-20 ENCOUNTER — Telehealth: Payer: Self-pay | Admitting: Internal Medicine

## 2022-09-20 NOTE — Telephone Encounter (Signed)
Forwarded to [employer]

## 2022-09-20 NOTE — Telephone Encounter (Signed)
Wife left a message saying she needed to schedule an appointment for him.  It has been a while since he has been seen.  In looking he saw Dr. Laural Golden a while back so if you could please call her and get him scheduled.  Thanks.

## 2022-11-09 ENCOUNTER — Ambulatory Visit: Payer: Medicare Other | Admitting: Cardiology

## 2023-01-08 ENCOUNTER — Encounter: Payer: Self-pay | Admitting: Internal Medicine

## 2023-01-08 NOTE — Progress Notes (Signed)
Cardiology Office Note:    Date:  01/22/2023   ID:  Martin Frazier., DOB April 26, 1934, MRN [id number]  PCP:  [facility] Cardiologist:  Carlyle Dolly, MD     Referring MD: Leeanne Rio, MD   Chief Complaint:  Follow-up     History of Present Illness:   Martin Frazier. is a 89 y.o. male with history of CAD admitted with chest pain 03/2022 mild elevated troponins Lexiscan with moderate ischemia cardiac cath 04/20/2022 DES to the LAD with residual 80% ramus, small proximal to mid 90% RPA V occluded with left to right collaterals.  Ramus disease managed medically to limit contrast but could consider intervention if recurrent symptoms.  Patient also has hyperlipidemia CKD stage III, DM 2, history of CVA 02/2020 and was on long-term Plavix.  Patient saw Dr. Harl Bowie 04/2022 who felt he could stop the aspirin after 1 year and remain on Plavix monotherapy.  If recurrent symptoms consider ramus intervention.       Patient comes in with his wife. Having a lot of reflux and having some chest pain. DOE and chest pain has resolved. Has an appt with GI next week. It helps him to sip water and vinegar with his meal. Has had a lot of blood work recently at the [facility] in [place]. Has Fe def anemia and on iron but may need to have an infusion. Also has a  B12 def.       Past Medical History:  Diagnosis Date   Collapsed lung    left   Diabetes mellitus without complication ([facility])    over 10 yrs   GERD (gastroesophageal reflux disease)    History of CVA (cerebrovascular accident)    Hypertension    Kidney stones    Current Medications: Current Meds  Medication Sig   acetaminophen (TYLENOL) 325 MG tablet Take 2 tablets (650 mg total) by mouth every 4 (four) hours as needed for mild pain (or temp > 37.5 C (99.5 F)).   aspirin EC 81 MG EC tablet Take 1 tablet (81 mg total) by mouth daily. Swallow whole.   atorvastatin (LIPITOR) 80 MG tablet Take 1 tablet (80  mg total) by mouth daily.   Cholecalciferol (VITAMIN D) 50 MCG (2000 UT) CAPS Take 1 capsule (2,000 Units total) by mouth daily.   clopidogrel (PLAVIX) 75 MG tablet Take 1 tablet (75 mg total) by mouth daily.   cyanocobalamin (VITAMIN B12) 500 MCG tablet Take 500 mcg by mouth daily.   ferrous sulfate 325 (65 FE) MG EC tablet Take 325 mg by mouth daily with breakfast.   glimepiride (AMARYL) 1 MG tablet Take 1 tablet (1 mg total) by mouth daily with breakfast.   latanoprost (XALATAN) 0.005 % ophthalmic solution Place 1 drop into both eyes at bedtime.   Magnesium 250 MG TABS Take 1 tablet by mouth daily.   Multiple Vitamins-Minerals (ONE-A-DAY 50 PLUS PO) Take by mouth.   nitroGLYCERIN (NITROSTAT) 0.4 MG SL tablet Place 1 tablet (0.4 mg total) under the tongue every 5 (five) minutes as needed for chest pain.   pantoprazole (PROTONIX) 40 MG tablet Take 40 mg by mouth daily.   tamsulosin (FLOMAX) 0.4 MG CAPS capsule Take 1 capsule (0.4 mg total) by mouth daily.   vitamin B-12 (CYANOCOBALAMIN) 1000 MCG tablet Take 1 tablet (1,000 mcg total) by mouth daily.    Allergies:   Sulfa antibiotics   Social History   Tobacco Use  Smoking status: Former    Types: Cigarettes   Smokeless tobacco: Never   Tobacco comments:    quit 2006  Vaping Use   Vaping Use: Never used  Substance Use Topics   Alcohol use: No   Drug use: No    Family Hx: The patient's family history includes Heart attack in his father.  ROS     Physical Exam:    VS:  BP 138/60   Pulse 82   Ht 5\' 3"  (1.6 m)   Wt 142 lb (64.4 kg)   SpO2 (!) 9%   BMI 25.15 kg/m     Wt Readings from Last 3 Encounters:  01/22/23 142 lb (64.4 kg)  05/09/22 137 lb 9.6 oz (62.4 kg)  04/18/22 131 lb 4.8 oz (59.6 kg)    Physical Exam  GEN: Well nourished, well developed, in no acute distress  HEENT: normal  Neck: no JVD, carotid bruits, or masses Cardiac:RRR; no murmurs, rubs, or gallops  Respiratory:  clear to auscultation  bilaterally, normal work of breathing GI: soft, nontender, nondistended, + BS Ext: without cyanosis, clubbing, or edema, Good distal pulses bilaterally MS: no deformity or atrophy  Skin: warm and dry, no rash Neuro:  Alert and Oriented x 3, Strength and sensation are intact Psych: euthymic mood, full affect        EKGs/Labs/Other Test Reviewed:    EKG:  EKG is  not ordered today.     Recent Labs: 04/17/2022: ALT 19; B Natriuretic Peptide 177.0 04/21/2022: BUN 29; Creatinine, Ser 1.72; Hemoglobin 11.3; Platelets 159; Potassium 3.8; Sodium 137   Recent Lipid Panel Recent Labs    04/21/22 0325  CHOL 110  TRIG 60  HDL 50  VLDL 12  LDLCALC 48     Prior CV Studies:     03/2022 echo  1. Left ventricular ejection fraction, by estimation, is 55 to 60%. The  left ventricle has normal function. The left ventricle has no regional  wall motion abnormalities. There is mild left ventricular hypertrophy.  Left ventricular diastolic parameters  are consistent with Grade I diastolic dysfunction (impaired relaxation).   2. Right ventricular systolic function is normal. The right ventricular  size is normal. There is normal pulmonary artery systolic pressure.   3. The mitral valve is abnormal. Mild mitral valve regurgitation. No  evidence of mitral stenosis.   4. The aortic valve is tricuspid. There is mild calcification of the  aortic valve. There is mild thickening of the aortic valve. Aortic valve  regurgitation is mild. No aortic stenosis is present.   5. The inferior vena cava is normal in size with greater than 50%  respiratory variability, suggesting right atrial pressure of 3 mmHg.    03/2022 nuclear stress Findings are consistent with prior inferior/inferoseptal myocardial infarction with moderate peri-infarct ischemia. Moderate apical ischemia The study is high risk. High risk based on at least moderate ischemia in multiple areas. Borderline TID 1.25 may suggest component of  balanced ischemia   No ST deviation was noted.   LV perfusion is abnormal. There is a large moderate intensity inferior/inferoseptal/ defect with moderate reversibilty. There is a moderate size apical defect that is completely reversible.   Left ventricular function is abnormal. Nuclear stress EF: 38 %. The left ventricular ejection fraction is moderately decreased (30-44%). End diastolic cavity size is normal.   03/2022 cath CULPRIT LESION: Prox LAD lesion is 90% stenosed (ulcerated).  Mid LAD lesion is 80% stenosed.   A drug-eluting stent was successfully  placed covering both lesions, using a STENT ONYX FRONTIER 2.75X30.  Stent was postdilated to 3.1 mm proximally tapered to 2.9 mm distally   Post intervention, there is a 0% residual stenosis of the stent.Marland Kitchen   -------------------------------------------------   Ramus lesion is 80% stenosed. -Would be PCI amenable, but may not be causing symptoms.  Would recommend medical management at this time.  If symptoms warrant, can consider staged PCI.   Small caliber (<2 mm ) RCA: Prox RCA to Mid RCA lesion is 90% stenosed. RPAV lesion is 100% stenosed (fills via left to right collaterals)   Mid LAD lesion is 80% stenosed.   -------------------------------------------------   LV end diastolic pressure is moderately elevated.   There is no aortic valve stenosis.   SUMMARY Severe Three-Vessel CAD: Very small caliber (however dominant) RCA with mid 90% stenosis followed by CTO (flush occlusion) of RPA V with 2 PL branches filling via collaterals from the LCx (PDA also has retrograde filling from septal perforator branches) Normal caliber LAD with ulcerated 90% proximal LAD (before SP1) followed by sequential 80% lesion just beyond SP1 with then mild diffuse disease distally.  (Likely culprit lesion) Successful DES PCI of proximal LAD covering both 90 and 80% lesions using Onyx Frontier DES 2.75 mm x 30 mm postdilated to 3.1 mm 80% Focal Ramus Intermedius  stenosis (small-moderate caliber vessel, would consider medical therapy following LAD PCI, however if he were to have symptoms either tomorrow or in the outpatient setting would consider PCI) Relatively normal LCx Moderately elevated LVEDP      Risk Assessment/Calculations/Metrics:              ASSESSMENT & PLAN:   No problem-specific Assessment & Plan notes found for this encounter.   CAD status post DES to the LAD 04/20/2022 residual 80% ramus treated medically to limit contrast but could intervene pending symptoms.  Patient denies chest pain or dyspnea. Continue Plavix and ASA for now. Has a lot of GI symptoms as well as Fe def and B12 def to see GI soon. Plan to stop ASA in May if stable and remain on Plavix monotherapy  Hyperlipidemia on atorvastatin  DM2  CKD stage III labs checked at the [facility] recently so won't repeat  History of CVA right thalamic infarction 02/2020 has been on long-term Plavix now on DAPT after stent  GERD with severe reflux to see GI            Dispo:  No follow-ups on file.   Medication Adjustments/Labs and Tests Ordered: Current medicines are reviewed at length with the patient today.  Concerns regarding medicines are outlined above.  Tests Ordered: No orders of the defined types were placed in this encounter.  Medication Changes: No orders of the defined types were placed in this encounter.  Elson Clan, PA-C  01/22/2023 11:51 AM    [facility] [address] [place], [place], [place]  [address] Phone: [phone]; Fax: [phone]

## 2023-01-22 ENCOUNTER — Ambulatory Visit: Payer: Medicare Other | Attending: Cardiology | Admitting: Physician Assistant

## 2023-01-22 ENCOUNTER — Encounter: Payer: Self-pay | Admitting: Physician Assistant

## 2023-01-22 VITALS — BP 138/60 | HR 82 | Ht 63.0 in | Wt 142.0 lb

## 2023-01-22 DIAGNOSIS — E0800 Diabetes mellitus due to underlying condition with hyperosmolarity without nonketotic hyperglycemic-hyperosmolar coma (NKHHC): Secondary | ICD-10-CM | POA: Insufficient documentation

## 2023-01-22 DIAGNOSIS — N1831 Chronic kidney disease, stage 3a: Secondary | ICD-10-CM | POA: Diagnosis present

## 2023-01-22 DIAGNOSIS — I2510 Atherosclerotic heart disease of native coronary artery without angina pectoris: Secondary | ICD-10-CM | POA: Diagnosis present

## 2023-01-22 DIAGNOSIS — K219 Gastro-esophageal reflux disease without esophagitis: Secondary | ICD-10-CM | POA: Diagnosis present

## 2023-01-22 DIAGNOSIS — I6381 Other cerebral infarction due to occlusion or stenosis of small artery: Secondary | ICD-10-CM | POA: Insufficient documentation

## 2023-01-22 DIAGNOSIS — E785 Hyperlipidemia, unspecified: Secondary | ICD-10-CM | POA: Insufficient documentation

## 2023-01-22 NOTE — Patient Instructions (Signed)
Medication Instructions:  Your physician recommends that you continue on your current medications as directed. Please refer to the Current Medication list given to you today.   Labwork: None today  Testing/Procedures: None today  Follow-Up: May with Dr.Branch  Any Other Special Instructions Will Be Listed Below (If Applicable).  If you need a refill on your cardiac medications before your next appointment, please call your pharmacy.

## 2023-02-14 ENCOUNTER — Ambulatory Visit: Payer: Medicare Other | Admitting: Gastroenterology

## 2023-02-15 ENCOUNTER — Encounter: Payer: Self-pay | Admitting: Cardiology

## 2023-02-15 ENCOUNTER — Ambulatory Visit: Payer: Medicare Other | Attending: Cardiology | Admitting: Cardiology

## 2023-02-15 VITALS — BP 135/60 | HR 90 | Ht 63.0 in | Wt 137.2 lb

## 2023-02-15 DIAGNOSIS — R600 Localized edema: Secondary | ICD-10-CM

## 2023-02-15 DIAGNOSIS — I2510 Atherosclerotic heart disease of native coronary artery without angina pectoris: Secondary | ICD-10-CM | POA: Diagnosis not present

## 2023-02-15 DIAGNOSIS — R0602 Shortness of breath: Secondary | ICD-10-CM | POA: Insufficient documentation

## 2023-02-15 MED ORDER — FUROSEMIDE 20 MG PO TABS: 20.0000 mg | ORAL_TABLET | Freq: Every day | ORAL | 11 refills | Status: DC

## 2023-02-15 NOTE — Patient Instructions (Signed)
Medication Instructions:  Your physician has recommended you make the following change in your medication:  - Start Lasix 20 mg tablets once daily   Labwork: In 2 Weeks: - BMET - BNP - MAG  Testing/Procedures: Your physician has requested that you have an echocardiogram. Echocardiography is a painless test that uses sound waves to create images of your heart. It provides your doctor with information about the size and shape of your heart and how well your heart's chambers and valves are working. This procedure takes approximately one hour. There are no restrictions for this procedure. Please do NOT wear cologne, perfume, aftershave, or lotions (deodorant is allowed). Please arrive 15 minutes prior to your appointment time.   Follow-Up: Follow up with Dr. Harl Bowie or [facility] in 1 month.   Any Other Special Instructions Will Be Listed Below (If Applicable).     If you need a refill on your cardiac medications before your next appointment, please call your pharmacy.

## 2023-02-15 NOTE — Progress Notes (Signed)
Clinical Summary Mr. Hedstrom is a 89 y.o.male seen today for follow up of the following medical problems.   This is a focused visit for recent issues with leg edema   LE edema - started few months ago, progressing - some SOB/DOE - has not been on diuretic.    Other medical problems not addressed this visit    1.CAD/chest pain - admit 03/2022 with chest pain - Mild peak trop of 46 but did have a crescendo/decrescendo pattern suggesting possible ischemia. EKG without specific acute ischemic changes, LVEF 55-60% no WMAs. Lexiscan this AM moderate infero/inferoseptal infarct with moderate peri-infarct inschemia, moderate apical ischemia as apical defect is completely reversible, borderline TID 1.25   - 04/20/22 cath: LM mild disease, LAD prox 90% and mid 80%, ramus 80%, LCX luminal irreg, RCA small prox to mid 90%, RPAV occluded with left to right collaterals - DES to LAD. Ramus disease managed medically to limit contrast but could consider intervention pending symptoms.    - no recent chest pain - compliant with meds       2.Hyperlipidemia - atorva increased from 10 to 65m during recent admission     3. DM2     4. CKD 3   5. 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 - had been on longterm plavix, now on DAPT after recent stent.  Past Medical History:  Diagnosis Date   Collapsed lung    left   Diabetes mellitus without complication (HCC)    over 10 yrs   GERD (gastroesophageal reflux disease)    History of CVA (cerebrovascular accident)    Hypertension    Kidney stones      Allergies  Allergen Reactions   Sulfa Antibiotics Nausea And Vomiting     Current Outpatient Medications  Medication Sig Dispense Refill   acetaminophen (TYLENOL) 325 MG tablet Take 2 tablets (650 mg total) by mouth every 4 (four) hours as needed for mild pain (or temp > 37.5 C (99.5 F)).      aspirin EC 81 MG EC tablet Take 1 tablet (81 mg total) by mouth daily. Swallow whole. 30 tablet 1   atorvastatin (LIPITOR) 80 MG tablet Take 1 tablet (80 mg total) by mouth daily. 90 tablet 3   Cholecalciferol (VITAMIN D) 50 MCG (2000 UT) CAPS Take 1 capsule (2,000 Units total) by mouth daily. 30 capsule 0   clopidogrel (PLAVIX) 75 MG tablet Take 1 tablet (75 mg total) by mouth daily. 30 tablet 1   cyanocobalamin (VITAMIN B12) 500 MCG tablet Take 500 mcg by mouth daily.     ferrous sulfate 325 (65 FE) MG EC tablet Take 325 mg by mouth daily with breakfast.     finasteride (PROSCAR) 5 MG tablet Take 5 mg by mouth daily.     glimepiride (AMARYL) 1 MG tablet Take 1 tablet (1 mg total) by mouth daily with breakfast. 30 tablet 0   latanoprost (XALATAN) 0.005 % ophthalmic solution Place 1 drop into both eyes at bedtime. 2.5 mL 12   Magnesium 250 MG TABS Take 1 tablet by mouth daily.     Multiple Vitamins-Minerals (ONE-A-DAY 50 PLUS PO) Take by mouth.     nitroGLYCERIN (NITROSTAT) 0.4 MG SL tablet Place 1 tablet (0.4 mg total) under the tongue every 5 (five) minutes as needed for chest pain. 30 tablet 0   pantoprazole (PROTONIX) 40 MG tablet  Take 40 mg by mouth daily.     tamsulosin (FLOMAX) 0.4 MG CAPS capsule Take 1 capsule (0.4 mg total) by mouth daily. 30 capsule 0   vitamin B-12 (CYANOCOBALAMIN) 1000 MCG tablet Take 1 tablet (1,000 mcg total) by mouth daily. 30 tablet 0   No current facility-administered medications for this visit.     Past Surgical History:  Procedure Laterality Date   APPENDECTOMY     BIOPSY  04/01/2019   Procedure: BIOPSY;  Surgeon: Rogene Houston, MD;  [facility];  Service: Endoscopy;;  gastric   CORONARY STENT INTERVENTION N/A 04/20/2022   Procedure: CORONARY STENT INTERVENTION;  Surgeon: Leonie Man, MD;  Location: [facility];  Service: Cardiovascular;  Laterality: N/A;   CYSTOSCOPY W/ URETERAL STENT PLACEMENT Right 04/03/2019   Procedure:  CYSTOSCOPY WITH RETROGRADE PYELOGRAM/URETERAL STENT PLACEMENT;  Surgeon: Franchot Gallo, MD;  Location: [facility];  Service: Urology;  Laterality: Right;   ESOPHAGOGASTRODUODENOSCOPY N/A 04/01/2019   Procedure: ESOPHAGOGASTRODUODENOSCOPY (EGD);  Surgeon: Rogene Houston, MD;  [facility];  Service: Endoscopy;  Laterality: N/A;  10:55am   EXTRACORPOREAL SHOCK WAVE LITHOTRIPSY Right 04/16/2019   Procedure: EXTRACORPOREAL SHOCK WAVE LITHOTRIPSY (ESWL);  Surgeon: Festus Aloe, MD;  Location: [facility];  Service: Urology;  Laterality: Right;   HEMORRHOID SURGERY     KIDNEY STONE SURGERY     LEFT HEART CATH AND CORONARY ANGIOGRAPHY N/A 04/20/2022   Procedure: LEFT HEART CATH AND CORONARY ANGIOGRAPHY;  Surgeon: Leonie Man, MD;  Location: [facility];  Service: Cardiovascular;  Laterality: N/A;   TONSILLECTOMY       Allergies  Allergen Reactions   Sulfa Antibiotics Nausea And Vomiting      Family History  Problem Relation Age of Onset   Heart attack Father      Social History Mr. Rathje reports that he has quit smoking. His smoking use included cigarettes. He has never used smokeless tobacco. Mr. Czerwonka reports no history of alcohol use.   Review of Systems CONSTITUTIONAL: No weight loss, fever, chills, weakness or fatigue.  HEENT: Eyes: No visual loss, blurred vision, double vision or yellow sclerae.No hearing loss, sneezing, congestion, runny nose or sore throat.  SKIN: No rash or itching.  CARDIOVASCULAR: per hpi RESPIRATORY: per hpi GASTROINTESTINAL: No anorexia, nausea, vomiting or diarrhea. No abdominal pain or blood.  GENITOURINARY: No burning on urination, no polyuria NEUROLOGICAL: No headache, dizziness, syncope, paralysis, ataxia, numbness or tingling in the extremities. No change in bowel or bladder control.  MUSCULOSKELETAL: No muscle, back pain, joint pain or stiffness.  LYMPHATICS: No enlarged nodes. No history of splenectomy.  PSYCHIATRIC: No  history of depression or anxiety.  ENDOCRINOLOGIC: No reports of sweating, cold or heat intolerance. No polyuria or polydipsia.  Marland Kitchen   Physical Examination Today's Vitals   02/15/23 1000 02/15/23 1025  BP: (!) 142/68 135/60  Pulse: 90   SpO2: 97%   Weight: 137 lb 3.2 oz (62.2 kg)   Height: 5' 3"$  (1.6 m)    Body mass index is 24.3 kg/m.  Gen: resting comfortably, no acute distress HEENT: no scleral icterus, pupils equal round and reactive, no palptable cervical adenopathy,  CV: RRR, no m/rg, no jvd Resp: very faint crackles bilateral bases GI: abdomen is soft, non-tender, non-distended, normal bowel sounds, no hepatosplenomegaly MSK: extremities are warm, 2+ bilateral LE edema Skin: warm, no rash Neuro:  no focal deficits Psych: appropriate affect   Diagnostic Studies  03/2022 echo  1.  Left ventricular ejection fraction, by estimation, is 55 to 60%. The  left ventricle has normal function. The left ventricle has no regional  wall motion abnormalities. There is mild left ventricular hypertrophy.  Left ventricular diastolic parameters  are consistent with Grade I diastolic dysfunction (impaired relaxation).   2. Right ventricular systolic function is normal. The right ventricular  size is normal. There is normal pulmonary artery systolic pressure.   3. The mitral valve is abnormal. Mild mitral valve regurgitation. No  evidence of mitral stenosis.   4. The aortic valve is tricuspid. There is mild calcification of the  aortic valve. There is mild thickening of the aortic valve. Aortic valve  regurgitation is mild. No aortic stenosis is present.   5. The inferior vena cava is normal in size with greater than 50%  respiratory variability, suggesting right atrial pressure of 3 mmHg.    03/2022 nuclear stress Findings are consistent with prior inferior/inferoseptal myocardial infarction with moderate peri-infarct ischemia. Moderate apical ischemia The study is high risk. High risk  based on at least moderate ischemia in multiple areas. Borderline TID 1.25 may suggest component of balanced ischemia   No ST deviation was noted.   LV perfusion is abnormal. There is a large moderate intensity inferior/inferoseptal/ defect with moderate reversibilty. There is a moderate size apical defect that is completely reversible.   Left ventricular function is abnormal. Nuclear stress EF: 38 %. The left ventricular ejection fraction is moderately decreased (30-44%). End diastolic cavity size is normal.   03/2022 cath CULPRIT LESION: Prox LAD lesion is 90% stenosed (ulcerated).  Mid LAD lesion is 80% stenosed.   A drug-eluting stent was successfully placed covering both lesions, using a STENT ONYX FRONTIER 2.75X30.  Stent was postdilated to 3.1 mm proximally tapered to 2.9 mm distally   Post intervention, there is a 0% residual stenosis of the stent.Marland Kitchen   -------------------------------------------------   Ramus lesion is 80% stenosed. -Would be PCI amenable, but may not be causing symptoms.  Would recommend medical management at this time.  If symptoms warrant, can consider staged PCI.   Small caliber (<2 mm ) RCA: Prox RCA to Mid RCA lesion is 90% stenosed. RPAV lesion is 100% stenosed (fills via left to right collaterals)   Mid LAD lesion is 80% stenosed.   -------------------------------------------------   LV end diastolic pressure is moderately elevated.   There is no aortic valve stenosis.   SUMMARY Severe Three-Vessel CAD: Very small caliber (however dominant) RCA with mid 90% stenosis followed by CTO (flush occlusion) of RPA V with 2 PL branches filling via collaterals from the LCx (PDA also has retrograde filling from septal perforator branches) Normal caliber LAD with ulcerated 90% proximal LAD (before SP1) followed by sequential 80% lesion just beyond SP1 with then mild diffuse disease distally.  (Likely culprit lesion) Successful DES PCI of proximal LAD covering both 90 and 80%  lesions using Onyx Frontier DES 2.75 mm x 30 mm postdilated to 3.1 mm 80% Focal Ramus Intermedius stenosis (small-moderate caliber vessel, would consider medical therapy following LAD PCI, however if he were to have symptoms either tomorrow or in the outpatient setting would consider PCI) Relatively normal LCx Moderately elevated LVEDP       Assessment and Plan   1.Leg edema/SOB - progressing over the last few months, some assocaited SOB/DOE - start lasix 51m daily. 2 weeks check bmet/mg/bnp - with new onset progressing edema and some SOB recheck echo       JAlphonse Guild  Harl Bowie, M.D.

## 2023-02-26 IMAGING — DX DG CHEST 1V PORT
1 series · 1 of 1 positions shown · non-contrast
Comparison: 03/22/2020

CLINICAL DATA: Chest pain

EXAM:
PORTABLE CHEST 1 VIEW

[chest ap]
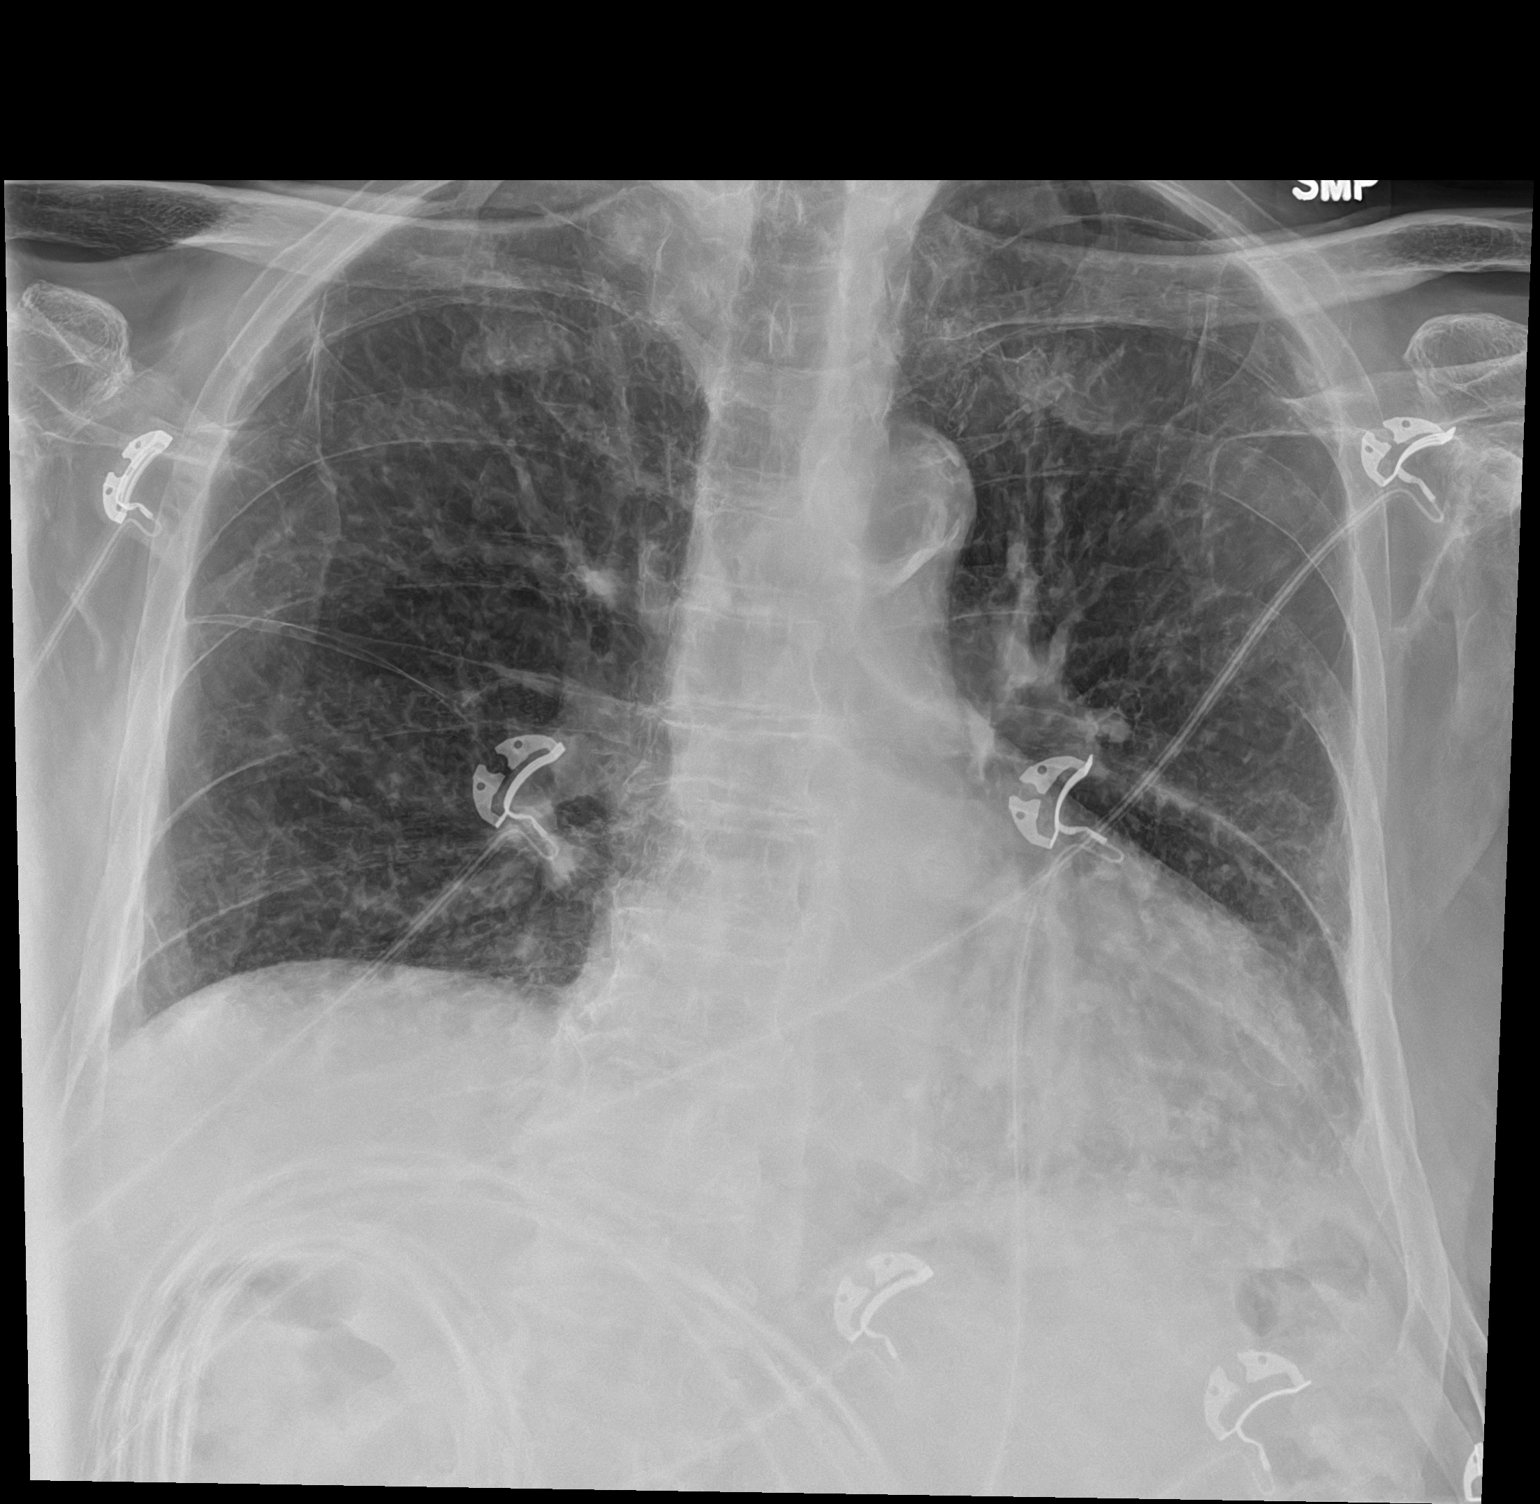

[1 of 1 positions shown; findings below may reference images not displayed]

FINDINGS: Mild cardiomegaly. Aortic atherosclerosis. No confluent opacities,
effusions or edema. No acute bony abnormality.
IMPRESSION: Mild cardiomegaly.  No active disease.

## 2023-02-28 ENCOUNTER — Other Ambulatory Visit (HOSPITAL_COMMUNITY)
Admission: RE | Admit: 2023-02-28 | Discharge: 2023-02-28 | Disposition: A | Discharge status: Home / Self Care | Payer: Medicare Other | Source: Ambulatory Visit | Attending: Cardiology | Admitting: Cardiology

## 2023-02-28 DIAGNOSIS — R0602 Shortness of breath: Secondary | ICD-10-CM | POA: Diagnosis present

## 2023-02-28 LAB — BASIC METABOLIC PANEL
Anion gap: 9 (ref 5–15)
BUN: 35 mg/dL — ABNORMAL HIGH (ref 8–23)
CO2: 23 mmol/L (ref 22–32)
Calcium: 8.8 mg/dL — ABNORMAL LOW (ref 8.9–10.3)
Chloride: 107 mmol/L (ref 98–111)
Creatinine, Ser: 1.97 mg/dL — ABNORMAL HIGH (ref 0.61–1.24)
GFR, Estimated: 32 mL/min — ABNORMAL LOW (ref >60)
Glucose, Bld: 144 mg/dL — ABNORMAL HIGH (ref 70–99)
Potassium: 3.7 mmol/L (ref 3.5–5.1)
Sodium: 139 mmol/L (ref 135–145)

## 2023-02-28 LAB — BRAIN NATRIURETIC PEPTIDE: B Natriuretic Peptide: 248 pg/mL — ABNORMAL HIGH (ref 0.0–100.0)

## 2023-02-28 LAB — MAGNESIUM: Magnesium: 1.9 mg/dL (ref 1.7–2.4)

## 2023-03-04 ENCOUNTER — Telehealth: Payer: Self-pay

## 2023-03-04 DIAGNOSIS — Z79899 Other long term (current) drug therapy: Secondary | ICD-10-CM

## 2023-03-04 MED ORDER — FUROSEMIDE 20 MG PO TABS: 20.0000 mg | ORAL_TABLET | ORAL | 3 refills | Status: DC

## 2023-03-04 NOTE — Telephone Encounter (Signed)
-----   Message from Arnoldo Lenis, MD sent at 03/04/2023 10:06 AM EST ----- Mild decrease in kidney function, please change lasix to '20mg'$  every other day. Repeat bmet in 2 weeks  Zandra Abts MD

## 2023-03-04 NOTE — Telephone Encounter (Signed)
I spoke with wife and they will decrease lasix to 20 mg every other day and repeat bmet on 03/18/23 when he has his echo.They will go to lab at 2 pm

## 2023-03-18 ENCOUNTER — Ambulatory Visit (HOSPITAL_COMMUNITY)
Admission: RE | Admit: 2023-03-18 | Discharge: 2023-03-18 | Disposition: A | Discharge status: Home / Self Care | Payer: Medicare Other | Source: Ambulatory Visit | Attending: Cardiology | Admitting: Cardiology

## 2023-03-18 ENCOUNTER — Other Ambulatory Visit (HOSPITAL_COMMUNITY)
Admission: RE | Admit: 2023-03-18 | Discharge: 2023-03-18 | Disposition: A | Discharge status: Home / Self Care | Payer: Medicare Other | Source: Ambulatory Visit | Attending: Cardiology | Admitting: Cardiology

## 2023-03-18 DIAGNOSIS — Z79899 Other long term (current) drug therapy: Secondary | ICD-10-CM | POA: Insufficient documentation

## 2023-03-18 DIAGNOSIS — R0602 Shortness of breath: Secondary | ICD-10-CM | POA: Diagnosis not present

## 2023-03-18 LAB — BASIC METABOLIC PANEL
Anion gap: 9 (ref 5–15)
BUN: 36 mg/dL — ABNORMAL HIGH (ref 8–23)
CO2: 24 mmol/L (ref 22–32)
Calcium: 8.6 mg/dL — ABNORMAL LOW (ref 8.9–10.3)
Chloride: 105 mmol/L (ref 98–111)
Creatinine, Ser: 1.81 mg/dL — ABNORMAL HIGH (ref 0.61–1.24)
GFR, Estimated: 36 mL/min — ABNORMAL LOW (ref >60)
Glucose, Bld: 199 mg/dL — ABNORMAL HIGH (ref 70–99)
Potassium: 4.4 mmol/L (ref 3.5–5.1)
Sodium: 138 mmol/L (ref 135–145)

## 2023-03-18 LAB — ECHOCARDIOGRAM COMPLETE
Area-P 1/2: 1.78 cm2
S' Lateral: 3 cm

## 2023-03-18 NOTE — Progress Notes (Signed)
*  PRELIMINARY RESULTS* Echocardiogram 2D Echocardiogram has been performed.  Martin Frazier 03/18/2023, 4:01 PM

## 2023-03-27 ENCOUNTER — Ambulatory Visit (INDEPENDENT_AMBULATORY_CARE_PROVIDER_SITE_OTHER): Payer: Medicare Other | Admitting: Gastroenterology

## 2023-03-27 ENCOUNTER — Ambulatory Visit: Payer: Medicare Other | Attending: Cardiology | Admitting: Cardiology

## 2023-03-27 ENCOUNTER — Encounter: Payer: Self-pay | Admitting: Gastroenterology

## 2023-03-27 ENCOUNTER — Encounter: Payer: Self-pay | Admitting: Cardiology

## 2023-03-27 VITALS — BP 142/60 | HR 92 | Temp 97.9°F | Ht 64.0 in | Wt 139.4 lb

## 2023-03-27 VITALS — BP 142/70 | HR 79 | Ht 63.0 in | Wt 138.2 lb

## 2023-03-27 DIAGNOSIS — I5033 Acute on chronic diastolic (congestive) heart failure: Secondary | ICD-10-CM | POA: Insufficient documentation

## 2023-03-27 DIAGNOSIS — K219 Gastro-esophageal reflux disease without esophagitis: Secondary | ICD-10-CM | POA: Diagnosis not present

## 2023-03-27 DIAGNOSIS — I2510 Atherosclerotic heart disease of native coronary artery without angina pectoris: Secondary | ICD-10-CM | POA: Insufficient documentation

## 2023-03-27 DIAGNOSIS — E782 Mixed hyperlipidemia: Secondary | ICD-10-CM | POA: Diagnosis present

## 2023-03-27 DIAGNOSIS — R1310 Dysphagia, unspecified: Secondary | ICD-10-CM | POA: Diagnosis not present

## 2023-03-27 DIAGNOSIS — D509 Iron deficiency anemia, unspecified: Secondary | ICD-10-CM | POA: Diagnosis not present

## 2023-03-27 DIAGNOSIS — R140 Abdominal distension (gaseous): Secondary | ICD-10-CM | POA: Diagnosis not present

## 2023-03-27 MED ORDER — NITROGLYCERIN 0.4 MG SL SUBL: 0.4000 mg | SUBLINGUAL_TABLET | SUBLINGUAL | 3 refills | Status: DC | PRN

## 2023-03-27 NOTE — Patient Instructions (Signed)
Medication Instructions:  Your physician has recommended you make the following change in your medication:  You can stop Aspirin on April 21, 2023.  *If you need a refill on your cardiac medications before your next appointment, please call your pharmacy*   Lab Work: None If you have labs (blood work) drawn today and your tests are completely normal, you will receive your results only by: [place] (if you have MyChart) OR A paper copy in the mail If you have any lab test that is abnormal or we need to change your treatment, we will call you to review the results.   Testing/Procedures: None   Follow-Up: At [facility], you and your health needs are our priority.  As part of our continuing mission to provide you with exceptional heart care, we have created designated Provider Care Teams.  These Care Teams include your primary Cardiologist (physician) and Advanced Practice Providers (APPs -  Physician Assistants and Nurse Practitioners) who all work together to provide you with the care you need, when you need it.  We recommend signing up for the patient portal called "MyChart".  Sign up information is provided on this After Visit Summary.  MyChart is used to connect with patients for Virtual Visits (Telemedicine).  Patients are able to view lab/test results, encounter notes, upcoming appointments, etc.  Non-urgent messages can be sent to your provider as well.   To learn more about what you can do with MyChart, go to [email or web address].    Your next appointment:   4 month(s)  Provider:   You may see Carlyle Dolly, MD or one of the following Advanced Practice Providers on your designated Care Team:   Bernerd Pho, PA-C  Ermalinda Barrios, [place]     Other Instructions

## 2023-03-27 NOTE — Progress Notes (Signed)
Clinical Summary Martin Frazier is a 89 y.o.male seen today for follow up of the following medical problems.         LE edema - started few months ago, progressing - some SOB/DOE - has not been on diuretic.    - last visit we started lasix 20mg  daily. Cr went from 1.7 to 1.9, changed lasix to 20mg  qod - repeat labs Cr down to 1.8 03/2023 echo: LVEF 60-65%, basal inferior hypokinesis, grade I dd, mild MR,   -some ongoing edema, - has not been taking fluid pill. Was to take lasix 20mg  qod.        2.CAD/chest pain - admit 03/2022 with chest pain - Mild peak trop of 46 but did have a crescendo/decrescendo pattern suggesting possible ischemia. EKG without specific acute ischemic changes, LVEF 55-60% no WMAs. Lexiscan this AM moderate infero/inferoseptal infarct with moderate peri-infarct inschemia, moderate apical ischemia as apical defect is completely reversible, borderline TID 1.25   - 04/20/22 cath: LM mild disease, LAD prox 90% and mid 80%, ramus 80%, LCX luminal irreg, RCA small prox to mid 90%, RPAV occluded with left to right collaterals - DES to LAD. Ramus disease managed medically to limit contrast but could consider intervention pending symptoms.   - no recent chest pains            3.Hyperlipidemia - atorva increased from 10 to 80mg  during recent admission - labs followed by [facility]     4. DM2     5. CKD 3   6. 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 - had been on longterm plavix, now on DAPT after recent stent.  Past Medical History:  Diagnosis Date   Collapsed lung    left   Diabetes mellitus without complication (HCC)    over 10 yrs   GERD (gastroesophageal reflux disease)    History of CVA (cerebrovascular accident)    Hypertension    Kidney stones      Allergies  Allergen Reactions   Sulfa Antibiotics Nausea And Vomiting     Current Outpatient  Medications  Medication Sig Dispense Refill   acetaminophen (TYLENOL) 325 MG tablet Take 2 tablets (650 mg total) by mouth every 4 (four) hours as needed for mild pain (or temp > 37.5 C (99.5 F)).     aspirin EC 81 MG EC tablet Take 1 tablet (81 mg total) by mouth daily. Swallow whole. 30 tablet 1   atorvastatin (LIPITOR) 80 MG tablet Take 1 tablet (80 mg total) by mouth daily. 90 tablet 3   Cholecalciferol (VITAMIN D) 50 MCG (2000 UT) CAPS Take 1 capsule (2,000 Units total) by mouth daily. 30 capsule 0   clopidogrel (PLAVIX) 75 MG tablet Take 1 tablet (75 mg total) by mouth daily. 30 tablet 1   cyanocobalamin (VITAMIN B12) 500 MCG tablet Take 500 mcg by mouth daily.     ferrous sulfate 325 (65 FE) MG EC tablet Take 325 mg by mouth daily with breakfast.     finasteride (PROSCAR) 5 MG tablet Take 5 mg by mouth daily.     furosemide (LASIX) 20 MG tablet Take 1 tablet (20 mg total) by mouth every other day. 45 tablet 3   glimepiride (AMARYL) 1 MG tablet Take 1 tablet (1 mg total) by mouth daily with breakfast. 30 tablet 0   latanoprost (XALATAN) 0.005 % ophthalmic solution Place  1 drop into both eyes at bedtime. 2.5 mL 12   Magnesium 250 MG TABS Take 1 tablet by mouth daily.     Multiple Vitamins-Minerals (ONE-A-DAY 50 PLUS PO) Take by mouth.     nitroGLYCERIN (NITROSTAT) 0.4 MG SL tablet Place 1 tablet (0.4 mg total) under the tongue every 5 (five) minutes as needed for chest pain. 30 tablet 0   pantoprazole (PROTONIX) 40 MG tablet Take 40 mg by mouth daily.     tamsulosin (FLOMAX) 0.4 MG CAPS capsule Take 1 capsule (0.4 mg total) by mouth daily. 30 capsule 0   vitamin B-12 (CYANOCOBALAMIN) 1000 MCG tablet Take 1 tablet (1,000 mcg total) by mouth daily. 30 tablet 0   No current facility-administered medications for this visit.     Past Surgical History:  Procedure Laterality Date   APPENDECTOMY     BIOPSY  04/01/2019   Procedure: BIOPSY;  Surgeon: Rogene Houston, MD;  [facility];  Service: Endoscopy;;  gastric   CORONARY STENT INTERVENTION N/A 04/20/2022   Procedure: CORONARY STENT INTERVENTION;  Surgeon: Leonie Man, MD;  Location: [place] [facility];  Service: Cardiovascular;  Laterality: N/A;   CYSTOSCOPY W/ URETERAL STENT PLACEMENT Right 04/03/2019   Procedure: CYSTOSCOPY WITH RETROGRADE PYELOGRAM/URETERAL STENT PLACEMENT;  Surgeon: Franchot Gallo, MD;  [facility];  Service: Urology;  Laterality: Right;   ESOPHAGOGASTRODUODENOSCOPY N/A 04/01/2019   Procedure: ESOPHAGOGASTRODUODENOSCOPY (EGD);  Surgeon: Rogene Houston, MD;  [facility];  Service: Endoscopy;  Laterality: N/A;  10:55am   EXTRACORPOREAL SHOCK WAVE LITHOTRIPSY Right 04/16/2019   Procedure: EXTRACORPOREAL SHOCK WAVE LITHOTRIPSY (ESWL);  Surgeon: Festus Aloe, MD;  [facility];  Service: Urology;  Laterality: Right;   HEMORRHOID SURGERY     KIDNEY STONE SURGERY     LEFT HEART CATH AND CORONARY ANGIOGRAPHY N/A 04/20/2022   Procedure: LEFT HEART CATH AND CORONARY ANGIOGRAPHY;  Surgeon: Leonie Man, MD;  Location: [place] [facility];  Service: Cardiovascular;  Laterality: N/A;   TONSILLECTOMY       Allergies  Allergen Reactions   Sulfa Antibiotics Nausea And Vomiting      Family History  Problem Relation Age of Onset   Heart attack Father      Social History Mr. Monteith reports that he has quit smoking. His smoking use included cigarettes. He has never used smokeless tobacco. Mr. Moffit reports no history of alcohol use.   Review of Systems CONSTITUTIONAL: No weight loss, fever, chills, weakness or fatigue.  HEENT: Eyes: No visual loss, blurred vision, double vision or yellow sclerae.No hearing loss, sneezing, congestion, runny nose or sore throat.  SKIN: No rash or itching.  CARDIOVASCULAR: per hpi RESPIRATORY: No shortness of breath, cough or sputum.  GASTROINTESTINAL: No anorexia, nausea, vomiting or diarrhea. No abdominal pain or blood.   GENITOURINARY: No burning on urination, no polyuria NEUROLOGICAL: No headache, dizziness, syncope, paralysis, ataxia, numbness or tingling in the extremities. No change in bowel or bladder control.  MUSCULOSKELETAL: No muscle, back pain, joint pain or stiffness.  LYMPHATICS: No enlarged nodes. No history of splenectomy.  PSYCHIATRIC: No history of depression or anxiety.  ENDOCRINOLOGIC: No reports of sweating, cold or heat intolerance. No polyuria or polydipsia.  Marland Kitchen   Physical Examination Today's Vitals   03/27/23 1536  BP: (!) 142/70  Pulse: 79  SpO2: 97%  Weight: 138 lb 3.2 oz (62.7 kg)  Height: 5\' 3"  (1.6 m)  PainSc: 0-No pain   Body mass index  is 24.48 kg/m.  Gen: resting comfortably, no acute distress HEENT: no scleral icterus, pupils equal round and reactive, no palptable cervical adenopathy,  CV: RRR, no m/rg, no jvd Resp: Clear to auscultation bilaterally GI: abdomen is soft, non-tender, non-distended, normal bowel sounds, no hepatosplenomegaly MSK: extremities are warm, 2+ bilateral LE edema Skin: warm, no rash Neuro:  no focal deficits Psych: appropriate affect   Diagnostic Studies 03/2022 echo  1. Left ventricular ejection fraction, by estimation, is 55 to 60%. The  left ventricle has normal function. The left ventricle has no regional  wall motion abnormalities. There is mild left ventricular hypertrophy.  Left ventricular diastolic parameters  are consistent with Grade I diastolic dysfunction (impaired relaxation).   2. Right ventricular systolic function is normal. The right ventricular  size is normal. There is normal pulmonary artery systolic pressure.   3. The mitral valve is abnormal. Mild mitral valve regurgitation. No  evidence of mitral stenosis.   4. The aortic valve is tricuspid. There is mild calcification of the  aortic valve. There is mild thickening of the aortic valve. Aortic valve  regurgitation is mild. No aortic stenosis is present.   5.  The inferior vena cava is normal in size with greater than 50%  respiratory variability, suggesting right atrial pressure of 3 mmHg.    03/2022 nuclear stress Findings are consistent with prior inferior/inferoseptal myocardial infarction with moderate peri-infarct ischemia. Moderate apical ischemia The study is high risk. High risk based on at least moderate ischemia in multiple areas. Borderline TID 1.25 may suggest component of balanced ischemia   No ST deviation was noted.   LV perfusion is abnormal. There is a large moderate intensity inferior/inferoseptal/ defect with moderate reversibilty. There is a moderate size apical defect that is completely reversible.   Left ventricular function is abnormal. Nuclear stress EF: 38 %. The left ventricular ejection fraction is moderately decreased (30-44%). End diastolic cavity size is normal.   03/2022 cath CULPRIT LESION: Prox LAD lesion is 90% stenosed (ulcerated).  Mid LAD lesion is 80% stenosed.   A drug-eluting stent was successfully placed covering both lesions, using a STENT ONYX FRONTIER 2.75X30.  Stent was postdilated to 3.1 mm proximally tapered to 2.9 mm distally   Post intervention, there is a 0% residual stenosis of the stent.Marland Kitchen   -------------------------------------------------   Ramus lesion is 80% stenosed. -Would be PCI amenable, but may not be causing symptoms.  Would recommend medical management at this time.  If symptoms warrant, can consider staged PCI.   Small caliber (<2 mm ) RCA: Prox RCA to Mid RCA lesion is 90% stenosed. RPAV lesion is 100% stenosed (fills via left to right collaterals)   Mid LAD lesion is 80% stenosed.   -------------------------------------------------   LV end diastolic pressure is moderately elevated.   There is no aortic valve stenosis.   SUMMARY Severe Three-Vessel CAD: Very small caliber (however dominant) RCA with mid 90% stenosis followed by CTO (flush occlusion) of RPA V with 2 PL branches filling  via collaterals from the LCx (PDA also has retrograde filling from septal perforator branches) Normal caliber LAD with ulcerated 90% proximal LAD (before SP1) followed by sequential 80% lesion just beyond SP1 with then mild diffuse disease distally.  (Likely culprit lesion) Successful DES PCI of proximal LAD covering both 90 and 80% lesions using Onyx Frontier DES 2.75 mm x 30 mm postdilated to 3.1 mm 80% Focal Ramus Intermedius stenosis (small-moderate caliber vessel, would consider medical therapy following LAD PCI,  however if he were to have symptoms either tomorrow or in the outpatient setting would consider PCI) Relatively normal LCx Moderately elevated LVEDP    03/2023 echo - LVEF 60-65%, basal inferior hypokinesis, grade I dd, mild MR,    Assessment and Plan   1.Acute on chronic HFpEF - echo LVEF 60-65%, basal inferior hypokinesis, grade I dd, mild MR, - BNP up to 248 - did not start lasix last visit, was concerned about frequent urination. Will try going forward lasix 20mg  qod  2. CAD - no recent symptoms - has been on DAPT since stent 03/2022. Had been on plavix monotherapy for CVA prior to stent, ASA was added at that time. May d/c ASA 04/21/23 and continue plavix as monotherapy  3. Hyperlipidemia - request labs from pcp - continue atorvastatin      Arnoldo Lenis, M.D.

## 2023-03-27 NOTE — Progress Notes (Signed)
GI Office Note    Referring Provider: [facility] Primary Care Physician:  [facility]  Primary Gastroenterologist: formerly Dr. Laural Golden  Chief Complaint   Chief Complaint  Patient presents with   Gastroesophageal Reflux    Having trouble with bowels as well.      History of Present Illness   Martin Yuill. is a 89 y.o. male presenting today at the request of the [facility] for further evaluation of GERD, right upper quadrant pain, IDA.  Previously followed by Dr. Laural Golden, last seen in March 2020.  Patient presents with his wife today who assists with the visit as he is hard of hearing. She states as long as she has known him, he has had stomach issues for years, runs in the family.   Patient has had chronic reflux for years, digestive issues, constipation intermittently.  He feels like his symptoms have gotten worse over the years with his progressive scoliosis/kyphosis.  His appetite is not what it used to be.  He denies any recent weight loss.  He was having some upper abdominal pain but he started back on taking vinegar mixed with water with each meal.  He states this helps his abdominal pain tremendously.  He goes out to eat he uses digestive enzymes, easier to take with him than vinegar. He is also on pantoprazole 40 mg every morning.  Really not having much in the way of heartburn at this time.  He has intermittent dysphagia, feels like food gets stuck in the upper esophagus.  He had this back in 2020, esophageal dilation seem to help.  Esophagus appeared normal on exam.  No difficulties taking medication.  He does have to limit his diet because of dysphagia.  He denies any vomiting.  Couple months ago he was having a hard time with constipation.  Currently he is having 2 small BMs daily, solid stool, typically after breakfast and lunch.  Denies any blood in the stool or melena.  His stools are dark on iron.  Labs from January 2024: Creatinine 1.64, albumin 3.3,  alkaline phosphatase 146 high, AST 27, ALT 24, total bilirubin 0.4.  Labs from December 2023: TIBC 416, iron 206 high, transferrin 279, white blood cell count 5020, hemoglobin 8.8, hematocrit 27.9, MCV 102.6, platelets 182,000, B12 1331, ferritin 29.  Labs from August 2023, folate greater than 24.  Seeing cardiology, recent ECHO due to lower extremity edema, shortness of breath/dyspnea on exertion.  History of CAD with stent in April 2023.  He was started on Lasix 20 mg daily.  He is waiting to hear results on his echocardiogram and lab work.  With follow up planned in 05/2023.   Echo March 2024: 1.  Left ventricular ejection fraction, by estimation, is 60 to 65%.  The left ventricle has normal function.  The left ventricle demonstrates regional wall motion abnormalities, basal inferior hypokinesis.  Left ventricular diastolic parameters are consistent with grade 1 diastolic dysfunction. 2.  Right ventricular systolic function is normal.  The right ventricular size is normal.  There is normal pulmonary artery systolic pressure.  The estimated right ventricular systolic pressure is 123XX123 mmHg. 3.  Left atrial size was small to moderately dilated. 4.  The mitral valve is abnormal.  Mild mitral valve regurgitation.  No evidence of mitral stenosis.  Moderate mitral annular calcification. 5.  The aortic valve is tricuspid.  Aortic valve regurgitation is mild.  No aortic stenosis is present. 6.  The  inferior vena cava is normal in size with greater than 50% respiratory variability, resting right atrial pressure of 3 mm per mercury.   EGD April 2020: -Normal esophagus -Z-line regular, 43 cm from the incisors -Erythematous and edematous mucosa in the prepyloric region of the stomach status post biopsy, reactive gastropathy, no H. pylori. -Normal duodenal bulb and second portion of duodenum  Attempted colonoscopy in February 2006: Tortuous very noncompliant sigmoid colon preventing complete exam.  Sigmoid  colon and rectal mucosa normal.  Small external hemorrhoids.  Medications   Current Outpatient Medications  Medication Sig Dispense Refill   acetaminophen (TYLENOL) 325 MG tablet Take 2 tablets (650 mg total) by mouth every 4 (four) hours as needed for mild pain (or temp > 37.5 C (99.5 F)).     aspirin EC 81 MG EC tablet Take 1 tablet (81 mg total) by mouth daily. Swallow whole. 30 tablet 1   atorvastatin (LIPITOR) 80 MG tablet Take 1 tablet (80 mg total) by mouth daily. 90 tablet 3   Cholecalciferol (VITAMIN D) 50 MCG (2000 UT) CAPS Take 1 capsule (2,000 Units total) by mouth daily. 30 capsule 0   clopidogrel (PLAVIX) 75 MG tablet Take 1 tablet (75 mg total) by mouth daily. 30 tablet 1   cyanocobalamin (VITAMIN B12) 500 MCG tablet Take 500 mcg by mouth daily.     ferrous sulfate 325 (65 FE) MG EC tablet Take 325 mg by mouth daily with breakfast.     finasteride (PROSCAR) 5 MG tablet Take 5 mg by mouth daily.     furosemide (LASIX) 20 MG tablet Take 1 tablet (20 mg total) by mouth every other day. 45 tablet 3   glimepiride (AMARYL) 1 MG tablet Take 1 tablet (1 mg total) by mouth daily with breakfast. 30 tablet 0   latanoprost (XALATAN) 0.005 % ophthalmic solution Place 1 drop into both eyes at bedtime. 2.5 mL 12   Multiple Vitamins-Minerals (ONE-A-DAY 50 PLUS PO) Take by mouth.     nitroGLYCERIN (NITROSTAT) 0.4 MG SL tablet Place 1 tablet (0.4 mg total) under the tongue every 5 (five) minutes as needed for chest pain. 30 tablet 0   pantoprazole (PROTONIX) 40 MG tablet Take 40 mg by mouth daily.     tamsulosin (FLOMAX) 0.4 MG CAPS capsule Take 1 capsule (0.4 mg total) by mouth daily. 30 capsule 0   vitamin B-12 (CYANOCOBALAMIN) 1000 MCG tablet Take 1 tablet (1,000 mcg total) by mouth daily. 30 tablet 0   No current facility-administered medications for this visit.    Allergies   Allergies as of 03/27/2023 - Review Complete 03/27/2023  Allergen Reaction Noted   Sulfa antibiotics Nausea  And Vomiting 12/31/2013    Past Medical History   Past Medical History:  Diagnosis Date   CAD (coronary artery disease)    Collapsed lung    left   Diabetes mellitus without complication (HCC)    over 10 yrs   GERD (gastroesophageal reflux disease)    History of CVA (cerebrovascular accident)    Hypertension    Kidney stones     Past Surgical History   Past Surgical History:  Procedure Laterality Date   APPENDECTOMY     BIOPSY  04/01/2019   Procedure: BIOPSY;  Surgeon: Rogene Houston, MD;  [facility];  Service: Endoscopy;;  gastric   CORONARY STENT INTERVENTION N/A 04/20/2022   Procedure: CORONARY STENT INTERVENTION;  Surgeon: Leonie Man, MD;  Location: [facility];  Service: Cardiovascular;  Laterality: N/A;   CYSTOSCOPY W/ URETERAL STENT PLACEMENT Right 04/03/2019   Procedure: CYSTOSCOPY WITH RETROGRADE PYELOGRAM/URETERAL STENT PLACEMENT;  Surgeon: Franchot Gallo, MD;  Location: [facility];  Service: Urology;  Laterality: Right;   ESOPHAGOGASTRODUODENOSCOPY N/A 04/01/2019   Procedure: ESOPHAGOGASTRODUODENOSCOPY (EGD);  Surgeon: Rogene Houston, MD;  [facility]: [facility];  Service: Endoscopy;  Laterality: N/A;  10:55am   EXTRACORPOREAL SHOCK WAVE LITHOTRIPSY Right 04/16/2019   Procedure: EXTRACORPOREAL SHOCK WAVE LITHOTRIPSY (ESWL);  Surgeon: Festus Aloe, MD;  Location: [facility];  Service: Urology;  Laterality: Right;   HEMORRHOID SURGERY     KIDNEY STONE SURGERY     LEFT HEART CATH AND CORONARY ANGIOGRAPHY N/A 04/20/2022   Procedure: LEFT HEART CATH AND CORONARY ANGIOGRAPHY;  Surgeon: Leonie Man, MD;  Location: [facility];  Service: Cardiovascular;  Laterality: N/A;   TONSILLECTOMY      Past Family History   Family History  Problem Relation Age of Onset   Heart attack Father     Past Social History   Social History   Socioeconomic History   Marital status: Married    Spouse name: Not on file   Number of children: Not on  file   Years of education: Not on file   Highest education level: Not on file  Occupational History   Not on file  Tobacco Use   Smoking status: Former    Types: Cigarettes   Smokeless tobacco: Never   Tobacco comments:    quit 2006  Vaping Use   Vaping Use: Never used  Substance and Sexual Activity   Alcohol use: No   Drug use: No   Sexual activity: Not Currently  Other Topics Concern   Not on file  Social History Narrative   Not on file   Social Determinants of Health   Financial Resource Strain: Not on file  Food Insecurity: Not on file  Transportation Needs: Not on file  Physical Activity: Not on file  Stress: Not on file  Social Connections: Not on file  Intimate Partner Violence: Not on file    Review of Systems   General: Negative for anorexia, weight loss, fever, chills, fatigue, +weakness. Eyes: Negative for vision changes.  ENT: Negative for hoarseness, difficulty swallowing , nasal congestion. CV: Negative for chest pain, angina, palpitations, +dyspnea on exertion, +peripheral edema.  Respiratory: Negative for dyspnea at rest, dyspnea on exertion, cough, sputum, wheezing.  GI: See history of present illness. GU:  Negative for dysuria, hematuria, urinary incontinence, urinary frequency, nocturnal urination.  MS: +joint pain, no low back pain.  Derm: Negative for rash or itching.  Neuro: Negative for weakness, abnormal sensation, seizure, frequent headaches, memory loss,  confusion.  Psych: Negative for anxiety, depression, suicidal ideation, hallucinations.  Endo: Negative for unusual weight change.  Heme: Negative for bruising or bleeding. Allergy: Negative for rash or hives.  Physical Exam   BP (!) 142/60 (BP Location: Right Arm, Patient Position: Sitting, Cuff Size: Normal)   Pulse 92   Temp 97.9 F (36.6 C) (Oral)   Ht 5\' 4"  (1.626 m)   Wt 139 lb 6.4 oz (63.2 kg)   SpO2 97%   BMI 23.93 kg/m    General: frail hard of hearing male in no acute  distress. Kyphosis noted. Head: Normocephalic, atraumatic.   Eyes: Conjunctiva pale, no icterus. Mouth: Oropharyngeal mucosa moist and pink , no lesions erythema or exudate. Neck: Supple without thyromegaly, masses, or lymphadenopathy.  Lungs: Clear to auscultation bilaterally.  Heart:  Regular rate and rhythm, no murmurs rubs or gallops.  Abdomen: Bowel sounds are normal, nontender, nondistended, no hepatosplenomegaly or masses, no abdominal bruits or hernia, no rebound or guarding.   Rectal: not performed Extremities: 2+ pitting edema to knees bilaterally. No clubbing or deformities.  Neuro: Alert and oriented x 4 , grossly normal neurologically.  Skin: Warm and dry, no rash or jaundice.   Psych: Alert and cooperative, normal mood and affect.  Labs   Lab Results  Component Value Date   CREATININE 1.81 (H) 03/18/2023   BUN 36 (H) 03/18/2023   NA 138 03/18/2023   K 4.4 03/18/2023   CL 105 03/18/2023   CO2 24 03/18/2023   Lab Results  Component Value Date   WBC 5.6 04/21/2022   HGB 11.3 (L) 04/21/2022   HCT 33.4 (L) 04/21/2022   MCV 99.4 04/21/2022   PLT 159 04/21/2022   See hpi, most recent Hgb in 8 range.  Imaging Studies   ECHOCARDIOGRAM COMPLETE  Result Date: 03/18/2023    ECHOCARDIOGRAM REPORT   Patient Name:   Martin Frazier. Date of Exam: 03/18/2023 Medical Rec #:  [id number]       Height:       63.0 in Accession #:    [id number]      Weight:       137.2 lb Date of Birth:  1934/08/27       BSA:          1.647 m Patient Age:    56 years        BP:           147/72 mmHg Patient Gender: M               HR:           81 bpm. Exam Location:  [facility] Procedure: 2D Echo, Cardiac Doppler and Color Doppler Indications:    SOB (shortness of breath)  History:        Patient has prior history of Echocardiogram examinations, most                 recent 04/18/2022. CHF, Stroke; Risk Factors:Hypertension,                 Diabetes and Dyslipidemia.  Sonographer:    Alvino Chapel RCS  Referring Phys: [id number] [place]  1. Left ventricular ejection fraction, by estimation, is 60 to 65%. The left ventricle has normal function. The left ventricle demonstrates regional wall motion abnormalities, basal inferior hypokinesis. Left ventricular diastolic parameters are consistent with Grade I diastolic dysfunction (impaired relaxation).  2. Right ventricular systolic function is normal. The right ventricular size is normal. There is normal pulmonary artery systolic pressure. The estimated right ventricular systolic pressure is 123XX123 mmHg.  3. Left atrial size was mild to moderately dilated.  4. The mitral valve is abnormal. Mild mitral valve regurgitation. No evidence of mitral stenosis. Moderate mitral annular calcification.  5. The aortic valve is tricuspid. Aortic valve regurgitation is mild. No aortic stenosis is present.  6. The inferior vena cava is normal in size with greater than 50% respiratory variability, suggesting right atrial pressure of 3 mmHg. Comparison(s): No significant change from prior study. FINDINGS  Left Ventricle: Left ventricular ejection fraction, by estimation, is 60 to 65%. The left ventricle has normal function. The left ventricle demonstrates regional wall motion abnormalities. The left ventricular internal cavity size was normal in size. There is no left ventricular  hypertrophy. Left ventricular diastolic parameters are consistent with Grade I diastolic dysfunction (impaired relaxation).  LV Wall Scoring: The basal inferior segment is hypokinetic. Right Ventricle: The right ventricular size is normal. No increase in right ventricular wall thickness. Right ventricular systolic function is normal. There is normal pulmonary artery systolic pressure. The tricuspid regurgitant velocity is 2.10 m/s, and  with an assumed right atrial pressure of 3 mmHg, the estimated right ventricular systolic pressure is 123XX123 mmHg. Left Atrium: Left atrial size was mild to  moderately dilated. Right Atrium: Right atrial size was normal in size. Pericardium: There is no evidence of pericardial effusion. Mitral Valve: The mitral valve is abnormal. Moderate mitral annular calcification. Mild mitral valve regurgitation. No evidence of mitral valve stenosis. Tricuspid Valve: The tricuspid valve is normal in structure. Tricuspid valve regurgitation is trivial. No evidence of tricuspid stenosis. Aortic Valve: The aortic valve is tricuspid. Aortic valve regurgitation is mild. No aortic stenosis is present. Pulmonic Valve: The pulmonic valve was normal in structure. Pulmonic valve regurgitation is trivial. No evidence of pulmonic stenosis. Aorta: The aortic root is normal in size and structure. Venous: The inferior vena cava is normal in size with greater than 50% respiratory variability, suggesting right atrial pressure of 3 mmHg. IAS/Shunts: No atrial level shunt detected by color flow Doppler.  LEFT VENTRICLE PLAX 2D LVIDd:         4.30 cm   Diastology LVIDs:         3.00 cm   LV e' medial:    4.24 cm/s LV PW:         1.10 cm   LV E/e' medial:  13.4 LV IVS:        1.10 cm   LV e' lateral:   7.18 cm/s LVOT diam:     2.00 cm   LV E/e' lateral: 7.9 LV SV:         63 LV SV Index:   38 LVOT Area:     3.14 cm  RIGHT VENTRICLE RV S prime:     10.40 cm/s TAPSE (M-mode): 1.8 cm LEFT ATRIUM           Index        RIGHT ATRIUM           Index LA diam:      3.30 cm 2.00 cm/m   RA Area:     16.30 cm LA Vol (A2C): 66.1 ml 40.12 ml/m  RA Volume:   37.50 ml  22.76 ml/m LA Vol (A4C): 57.0 ml 34.60 ml/m  AORTIC VALVE LVOT Vmax:   79.00 cm/s LVOT Vmean:  47.700 cm/s LVOT VTI:    0.199 m  AORTA Ao Root diam: 3.60 cm MITRAL VALVE                TRICUSPID VALVE MV Area (PHT): 1.78 cm     TR Peak grad:   17.6 mmHg MV Decel Time: 426 msec     TR Vmax:        210.00 cm/s MV E velocity: 57.00 cm/s MV A velocity: 121.00 cm/s  SHUNTS MV E/A ratio:  0.47         Systemic VTI:  0.20 m                              Systemic Diam: 2.00 cm Martin Frazier Electronically signed by Lorelee Cover Frazier Signature Date/Time: 03/18/2023/4:25:01 PM    Final  Assessment   GERD/Dysphagia/Bloating: typical heartburn controlled. Has noted intermittent solid food dysphagia, limits his diet. Has had some epigastric pain, although states that is better with vinegar before meals. He could have esophageal stricture, gastritis, ulcers, malignancy.  Constipation: doing better at this time, continue to monitor. Can add benefiier and/or miralax if needed.   IDA: Decline in Hgb from 11 to 8 since 03/2022. Ferritin low normal. Serum iron elevated. EGD 2020. Remote incomplete colonoscopy as outlined. He reports recent labs, we will try to obtain. Complete ifobt. He is concerned that endoscopic evaluation could be difficult in the setting of his posture.   B12 deficiency: reported deficiency, labs unavailable. He is on oral supplement. In 12/2022, MCV elevated. Folate normal.    The patient was found to have elevated blood pressure when vital signs were checked in the office. The blood pressure was rechecked by the nursing staff and it was found be persistently elevated >140/90 mmHg. I personally advised to the patient to follow up closely with his PCP for hypertension control.   PLAN   We will request copy of latest labs from [place]. May require additional update labs.  iFOBT. Continue pantoprazole daily. Add Benefiber 2 teaspoons once to twice daily. If ongoing constipation, add miralax one capful daily.  Follow up pending cardiac evaluation.  Would consider EGD at minimum, but he may benefit from colonoscopy. Await labs, ifobt.    Laureen Ochs. Bobby Rumpf, [place], PA-C [facility]  I have reviewed the note and agree with the APP's assessment as described in this progress note  Will check FOBT but may attempt both EGD/colonoscopy to evaluate his anemia further.  Maylon Peppers,  MD Gastroenterology and Hepatology [facility]

## 2023-03-27 NOTE — Patient Instructions (Signed)
We will request copy of your latest labs from the [place]. We will be in touch with further recommendations.  Please complete stool test and return to our office.  Continue pantoprazole daily before breakfast. For mild constipation, you can try taking fiber supplement daily such as Benefiber powder (2 teaspoons once to twice daily) OR Miralax (stool softener) one capful daily.

## 2023-04-01 ENCOUNTER — Other Ambulatory Visit: Payer: Self-pay

## 2023-04-01 ENCOUNTER — Ambulatory Visit (HOSPITAL_COMMUNITY)
Admission: RE | Admit: 2023-04-01 | Discharge: 2023-04-01 | Disposition: A | Discharge status: Home / Self Care | Payer: Medicare Other | Source: Ambulatory Visit | Attending: Urology | Admitting: Urology

## 2023-04-01 ENCOUNTER — Ambulatory Visit (INDEPENDENT_AMBULATORY_CARE_PROVIDER_SITE_OTHER): Payer: Medicare Other | Admitting: Gastroenterology

## 2023-04-01 ENCOUNTER — Encounter: Payer: Self-pay | Admitting: Urology

## 2023-04-01 ENCOUNTER — Ambulatory Visit (INDEPENDENT_AMBULATORY_CARE_PROVIDER_SITE_OTHER): Payer: Medicare Other | Admitting: Urology

## 2023-04-01 VITALS — BP 157/69 | HR 108

## 2023-04-01 DIAGNOSIS — R972 Elevated prostate specific antigen [PSA]: Secondary | ICD-10-CM

## 2023-04-01 DIAGNOSIS — N401 Enlarged prostate with lower urinary tract symptoms: Secondary | ICD-10-CM

## 2023-04-01 DIAGNOSIS — D509 Iron deficiency anemia, unspecified: Secondary | ICD-10-CM

## 2023-04-01 DIAGNOSIS — R3129 Other microscopic hematuria: Secondary | ICD-10-CM

## 2023-04-01 DIAGNOSIS — N200 Calculus of kidney: Secondary | ICD-10-CM | POA: Insufficient documentation

## 2023-04-01 DIAGNOSIS — R140 Abdominal distension (gaseous): Secondary | ICD-10-CM

## 2023-04-01 DIAGNOSIS — R1310 Dysphagia, unspecified: Secondary | ICD-10-CM

## 2023-04-01 DIAGNOSIS — K219 Gastro-esophageal reflux disease without esophagitis: Secondary | ICD-10-CM

## 2023-04-01 DIAGNOSIS — R3912 Poor urinary stream: Secondary | ICD-10-CM

## 2023-04-01 LAB — IFOBT (OCCULT BLOOD): IFOBT: POSITIVE

## 2023-04-01 NOTE — Progress Notes (Unsigned)
04/01/2023 1:24 PM   Jenkins. 24-May-1934 GP:[id number]  Referring provider: [facility], [place] [address]  No chief complaint on file.   HPI:  New patient-he was seen at [facility] urology-I checked those records-  1) BPH-patient had a history of urinary retention in 2020.  He is on tamsulosin and prescribed finasteride January 2024. No surgery.  Prostate was about 60 g.  2) PSA elevation-his PSA was 9.7 and 10.8 at the [place] in December 2023.  No prior prostate biopsy.  Prostate about 60 g on CT.  His January 2024 prostate exam was benign and January 2024 PSA was 7.6 prior to starting finasteride.  3) kidney stones-he underwent shockwave lithotripsy for a right ureteral stone in 2020.  Follow-up imaging showed resolution of ureteral stone and [place] no hydro in 2020. He has a known 12 mm RLP stone.   Today, seen for the above. AUASS = 6. Occasional colicky RUQ pain. No dysuria or hematuria.   UA with 3-10 rbc. No bac.    PMH: Past Medical History:  Diagnosis Date   CAD (coronary artery disease)    Collapsed lung    left   Diabetes mellitus without complication (HCC)    over 10 yrs   GERD (gastroesophageal reflux disease)    History of CVA (cerebrovascular accident)    Hypertension    Kidney stones     Surgical History: Past Surgical History:  Procedure Laterality Date   APPENDECTOMY     BIOPSY  04/01/2019   Procedure: BIOPSY;  Surgeon: Rogene Houston, MD;  Location: [facility];  Service: Endoscopy;;  gastric   CORONARY STENT INTERVENTION N/A 04/20/2022   Procedure: CORONARY STENT INTERVENTION;  Surgeon: Leonie Man, MD;  Location: [facility];  Service: Cardiovascular;  Laterality: N/A;   CYSTOSCOPY W/ URETERAL STENT PLACEMENT Right 04/03/2019   Procedure: CYSTOSCOPY WITH RETROGRADE PYELOGRAM/URETERAL STENT PLACEMENT;  Surgeon: Franchot Gallo, MD;  Location: [facility];  Service: Urology;  Laterality: Right;    ESOPHAGOGASTRODUODENOSCOPY N/A 04/01/2019   Procedure: ESOPHAGOGASTRODUODENOSCOPY (EGD);  Surgeon: Rogene Houston, MD;  Location: [facility];  Service: Endoscopy;  Laterality: N/A;  10:55am   EXTRACORPOREAL SHOCK WAVE LITHOTRIPSY Right 04/16/2019   Procedure: EXTRACORPOREAL SHOCK WAVE LITHOTRIPSY (ESWL);  Surgeon: Festus Aloe, MD;  Location: [facility];  Service: Urology;  Laterality: Right;   HEMORRHOID SURGERY     KIDNEY STONE SURGERY     LEFT HEART CATH AND CORONARY ANGIOGRAPHY N/A 04/20/2022   Procedure: LEFT HEART CATH AND CORONARY ANGIOGRAPHY;  Surgeon: Leonie Man, MD;  Location: [facility];  Service: Cardiovascular;  Laterality: N/A;   TONSILLECTOMY      Home Medications:  Allergies as of 04/01/2023       Reactions   Sulfa Antibiotics Nausea And Vomiting        Medication List        Accurate as of April 01, 2023  1:24 PM. If you have any questions, ask your nurse or doctor.          acetaminophen 325 MG tablet Commonly known as: TYLENOL Take 2 tablets (650 mg total) by mouth every 4 (four) hours as needed for mild pain (or temp > 37.5 C (99.5 F)).   aspirin EC 81 MG tablet Take 1 tablet (81 mg total) by mouth daily. Swallow whole.   atorvastatin 80 MG tablet Commonly known as: LIPITOR Take 1 tablet (80 mg total)  by mouth daily.   clopidogrel 75 MG tablet Commonly known as: PLAVIX Take 1 tablet (75 mg total) by mouth daily.   cyanocobalamin 500 MCG tablet Commonly known as: VITAMIN B12 Take 500 mcg by mouth daily.   cyanocobalamin 1000 MCG tablet Commonly known as: VITAMIN B12 Take 1 tablet (1,000 mcg total) by mouth daily.   ferrous sulfate 325 (65 FE) MG EC tablet Take 325 mg by mouth daily with breakfast.   finasteride 5 MG tablet Commonly known as: PROSCAR Take 5 mg by mouth daily.   furosemide 20 MG tablet Commonly known as: LASIX Take 1 tablet (20 mg total) by mouth every other day.   glimepiride 1 MG tablet Commonly known  as: AMARYL Take 1 tablet (1 mg total) by mouth daily with breakfast.   latanoprost 0.005 % ophthalmic solution Commonly known as: XALATAN Place 1 drop into both eyes at bedtime.   nitroGLYCERIN 0.4 MG SL tablet Commonly known as: Nitrostat Place 1 tablet (0.4 mg total) under the tongue every 5 (five) minutes as needed for chest pain.   ONE-A-DAY 50 PLUS PO Take by mouth.   pantoprazole 40 MG tablet Commonly known as: PROTONIX Take 40 mg by mouth daily.   tamsulosin 0.4 MG Caps capsule Commonly known as: FLOMAX Take 1 capsule (0.4 mg total) by mouth daily.   Vitamin D 50 MCG (2000 UT) Caps Take 1 capsule (2,000 Units total) by mouth daily.        Allergies:  Allergies  Allergen Reactions   Sulfa Antibiotics Nausea And Vomiting    Family History: Family History  Problem Relation Age of Onset   Heart attack Father     Social History:  reports that he has quit smoking. His smoking use included cigarettes. He has never used smokeless tobacco. He reports that he does not drink alcohol and does not use drugs.   Physical Exam: BP (!) 157/69   Pulse (!) 108   Constitutional:  Alert and oriented, No acute distress. HEENT: [place] AT, moist mucus membranes.  Trachea midline, no masses. Cardiovascular: No clubbing, cyanosis, or edema. Respiratory: Normal respiratory effort, no increased work of breathing. GI: Abdomen is soft, nontender, nondistended, no abdominal masses GU: No CVA tenderness Lymph: No cervical or inguinal lymphadenopathy. Skin: No rashes, bruises or suspicious lesions. Neurologic: Grossly intact, no focal deficits, moving all 4 extremities. Psychiatric: Normal mood and affect.  Laboratory Data: Lab Results  Component Value Date   WBC 5.6 04/21/2022   HGB 11.3 (L) 04/21/2022   HCT 33.4 (L) 04/21/2022   MCV 99.4 04/21/2022   PLT 159 04/21/2022    Lab Results  Component Value Date   CREATININE 1.81 (H) 03/18/2023    No results found for:  "PSA"  No results found for: "TESTOSTERONE"  Lab Results  Component Value Date   HGBA1C 6.3 (H) 04/18/2022    Urinalysis    Component Value Date/Time   COLORURINE YELLOW 03/22/2020 [place] 03/22/2020 1138   LABSPEC 1.016 03/22/2020 1138   PHURINE 7.0 03/22/2020 1138   GLUCOSEU 150 (A) 03/22/2020 1138   HGBUR NEGATIVE 03/22/2020 1138   [place] 03/22/2020 1138   [place] 03/22/2020 1138   PROTEINUR 30 (A) 03/22/2020 1138   NITRITE NEGATIVE 03/22/2020 1138   LEUKOCYTESUR NEGATIVE 03/22/2020 1138    Lab Results  Component Value Date   BACTERIA NONE SEEN 03/22/2020    Pertinent Imaging: [place], CT, KUB images in 2020.  Results for orders placed during  the hospital encounter of 06/10/19  DG Abd 1 View  Narrative CLINICAL DATA:  Right renal calculus.  EXAM: ABDOMEN - 1 VIEW  COMPARISON:  Radiograph of April 23, 2019.  FINDINGS: The bowel gas pattern is normal. Stable probable large right renal calculus is noted. Right ureteral stent noted on prior exam has been removed. Moderate dextroscoliosis of lumbar spine is noted.  IMPRESSION: Right ureteral stent noted on prior exam has been removed. Stable large calculus seen projected over lower pole of right kidney. No evidence of bowel obstruction or ileus.   Electronically Signed By: Marijo Conception M.D. On: 06/10/2019 15:00  No results found for this or any previous visit.  No results found for this or any previous visit.  No results found for this or any previous visit.  Results for orders placed during the hospital encounter of 11/30/19  US RENAL  Narrative CLINICAL DATA:  Renal calculus, diabetes mellitus, hypertension, history kidney stones  EXAM: RENAL / URINARY TRACT ULTRASOUND COMPLETE  COMPARISON:  CT abdomen and pelvis 02/02/2019  FINDINGS: Right Kidney:  Renal measurements: 11.9 x 4.9 x 4.8 cm = volume: 150 mL. Cortical thinning. Upper normal  cortical echogenicity. Cyst at upper pole 7.4 x 6.7 x 5.7 cm, simple features. Additional smaller cyst at mid inferior RIGHT kidney 1.9 x 1.3 x 1.9 cm, simple features. 15 mm diameter calculus identified at inferior pole. No hydronephrosis or additional mass.  Left Kidney:  Renal measurements: 12.3 x 6.0 x 6.0 cm = volume: 218 mL. Cortical thinning. Slightly increased cortical echogenicity. Several LEFT renal cysts are identified, including a 6.6 x 5.7 by 6.0 cm cyst at the upper pole, an additional 5.8 x 5.3 x 4.7 cm cyst at upper pole, and several additional tiny cysts measuring up to 2.6 cm. No solid mass or hydronephrosis. No definite shadowing calculi.  Bladder:  Appears normal for degree of bladder distention.  Other:  Prosthetic enlargement, gland measuring 4.5 cm length by 3.8 cm AP, not adequately visualized in transverse dimension.  IMPRESSION: Prostatic enlargement.  BILATERAL renal cysts without renal mass or hydronephrosis.  BILATERAL renal cortical thinning and question medical renal disease changes.  15 mm nonobstructing calculus at inferior pole RIGHT kidney.   Electronically Signed By: Lavonia Dana M.D. On: 11/30/2019 18:33  No valid procedures specified. No results found for this or any previous visit.  No results found for this or any previous visit.   Assessment & Plan:    Bph - cont tamsulosin and finasteride  PSA - PSA was sent  Kidney stone - check KUB at [facility]   MH - rehcheck UA in 3 mo and consider CT or [place] and cystoscopy.    No follow-ups on file.  Festus Aloe, MD  [facility]  [address] [place], [place] [address] [phone]

## 2023-04-02 LAB — URINALYSIS, ROUTINE W REFLEX MICROSCOPIC
Bilirubin, UA: NEGATIVE
Leukocytes,UA: NEGATIVE
Nitrite, UA: NEGATIVE
Specific Gravity, UA: 1.025 (ref 1.005–1.030)
Urobilinogen, Ur: 0.2 mg/dL (ref 0.2–1.0)
pH, UA: 5 (ref 5.0–7.5)

## 2023-04-02 LAB — PSA: Prostate Specific Ag, Serum: 3.5 ng/mL (ref 0.0–4.0)

## 2023-04-02 LAB — MICROSCOPIC EXAMINATION: Bacteria, UA: NONE SEEN

## 2023-04-10 ENCOUNTER — Telehealth: Payer: Self-pay

## 2023-04-10 NOTE — Telephone Encounter (Signed)
Pt's wife called to check on the status of the IFOBT and was wanting to know if we were able to get the recent lab results from the [place]. Please advise.

## 2023-04-11 ENCOUNTER — Other Ambulatory Visit: Payer: Self-pay

## 2023-04-11 ENCOUNTER — Emergency Department (HOSPITAL_COMMUNITY): Payer: Medicare Other

## 2023-04-11 ENCOUNTER — Inpatient Hospital Stay (HOSPITAL_COMMUNITY)
Admission: EM | Admit: 2023-04-11 | Discharge: 2023-04-13 | DRG: 377 | Disposition: A | Discharge status: Home / Self Care | Payer: Medicare Other | Attending: Internal Medicine | Admitting: Internal Medicine

## 2023-04-11 ENCOUNTER — Encounter (HOSPITAL_COMMUNITY): Payer: Self-pay | Admitting: *Deleted

## 2023-04-11 DIAGNOSIS — H9190 Unspecified hearing loss, unspecified ear: Secondary | ICD-10-CM | POA: Diagnosis present

## 2023-04-11 DIAGNOSIS — Z79899 Other long term (current) drug therapy: Secondary | ICD-10-CM

## 2023-04-11 DIAGNOSIS — I5033 Acute on chronic diastolic (congestive) heart failure: Secondary | ICD-10-CM | POA: Diagnosis present

## 2023-04-11 DIAGNOSIS — D649 Anemia, unspecified: Secondary | ICD-10-CM | POA: Diagnosis present

## 2023-04-11 DIAGNOSIS — R0789 Other chest pain: Secondary | ICD-10-CM | POA: Diagnosis not present

## 2023-04-11 DIAGNOSIS — K648 Other hemorrhoids: Secondary | ICD-10-CM | POA: Diagnosis present

## 2023-04-11 DIAGNOSIS — K5521 Angiodysplasia of colon with hemorrhage: Principal | ICD-10-CM | POA: Diagnosis present

## 2023-04-11 DIAGNOSIS — Z8249 Family history of ischemic heart disease and other diseases of the circulatory system: Secondary | ICD-10-CM | POA: Diagnosis not present

## 2023-04-11 DIAGNOSIS — G8929 Other chronic pain: Secondary | ICD-10-CM | POA: Diagnosis present

## 2023-04-11 DIAGNOSIS — E876 Hypokalemia: Secondary | ICD-10-CM | POA: Diagnosis present

## 2023-04-11 DIAGNOSIS — I10 Essential (primary) hypertension: Secondary | ICD-10-CM | POA: Diagnosis not present

## 2023-04-11 DIAGNOSIS — E119 Type 2 diabetes mellitus without complications: Secondary | ICD-10-CM

## 2023-04-11 DIAGNOSIS — K644 Residual hemorrhoidal skin tags: Secondary | ICD-10-CM | POA: Diagnosis present

## 2023-04-11 DIAGNOSIS — Z7902 Long term (current) use of antithrombotics/antiplatelets: Secondary | ICD-10-CM | POA: Diagnosis not present

## 2023-04-11 DIAGNOSIS — Z955 Presence of coronary angioplasty implant and graft: Secondary | ICD-10-CM

## 2023-04-11 DIAGNOSIS — Z7982 Long term (current) use of aspirin: Secondary | ICD-10-CM

## 2023-04-11 DIAGNOSIS — R1013 Epigastric pain: Secondary | ICD-10-CM | POA: Diagnosis not present

## 2023-04-11 DIAGNOSIS — E538 Deficiency of other specified B group vitamins: Secondary | ICD-10-CM

## 2023-04-11 DIAGNOSIS — D509 Iron deficiency anemia, unspecified: Secondary | ICD-10-CM | POA: Diagnosis present

## 2023-04-11 DIAGNOSIS — K5901 Slow transit constipation: Secondary | ICD-10-CM

## 2023-04-11 DIAGNOSIS — Z01810 Encounter for preprocedural cardiovascular examination: Secondary | ICD-10-CM | POA: Diagnosis not present

## 2023-04-11 DIAGNOSIS — I130 Hypertensive heart and chronic kidney disease with heart failure and stage 1 through stage 4 chronic kidney disease, or unspecified chronic kidney disease: Secondary | ICD-10-CM | POA: Diagnosis present

## 2023-04-11 DIAGNOSIS — E785 Hyperlipidemia, unspecified: Secondary | ICD-10-CM | POA: Diagnosis present

## 2023-04-11 DIAGNOSIS — E1122 Type 2 diabetes mellitus with diabetic chronic kidney disease: Secondary | ICD-10-CM | POA: Diagnosis not present

## 2023-04-11 DIAGNOSIS — K319 Disease of stomach and duodenum, unspecified: Secondary | ICD-10-CM | POA: Diagnosis present

## 2023-04-11 DIAGNOSIS — I5032 Chronic diastolic (congestive) heart failure: Secondary | ICD-10-CM | POA: Diagnosis not present

## 2023-04-11 DIAGNOSIS — Z7984 Long term (current) use of oral hypoglycemic drugs: Secondary | ICD-10-CM

## 2023-04-11 DIAGNOSIS — Z87442 Personal history of urinary calculi: Secondary | ICD-10-CM

## 2023-04-11 DIAGNOSIS — Z882 Allergy status to sulfonamides status: Secondary | ICD-10-CM

## 2023-04-11 DIAGNOSIS — R1310 Dysphagia, unspecified: Secondary | ICD-10-CM | POA: Diagnosis not present

## 2023-04-11 DIAGNOSIS — D631 Anemia in chronic kidney disease: Secondary | ICD-10-CM | POA: Diagnosis present

## 2023-04-11 DIAGNOSIS — I2510 Atherosclerotic heart disease of native coronary artery without angina pectoris: Secondary | ICD-10-CM | POA: Diagnosis present

## 2023-04-11 DIAGNOSIS — Z87891 Personal history of nicotine dependence: Secondary | ICD-10-CM

## 2023-04-11 DIAGNOSIS — K5900 Constipation, unspecified: Secondary | ICD-10-CM | POA: Diagnosis present

## 2023-04-11 DIAGNOSIS — R1314 Dysphagia, pharyngoesophageal phase: Secondary | ICD-10-CM | POA: Diagnosis present

## 2023-04-11 DIAGNOSIS — K219 Gastro-esophageal reflux disease without esophagitis: Secondary | ICD-10-CM | POA: Diagnosis present

## 2023-04-11 DIAGNOSIS — N1832 Chronic kidney disease, stage 3b: Secondary | ICD-10-CM | POA: Diagnosis not present

## 2023-04-11 DIAGNOSIS — I2489 Other forms of acute ischemic heart disease: Secondary | ICD-10-CM | POA: Diagnosis present

## 2023-04-11 DIAGNOSIS — K5520 Angiodysplasia of colon without hemorrhage: Secondary | ICD-10-CM | POA: Diagnosis not present

## 2023-04-11 DIAGNOSIS — I110 Hypertensive heart disease with heart failure: Secondary | ICD-10-CM | POA: Diagnosis not present

## 2023-04-11 DIAGNOSIS — R531 Weakness: Secondary | ICD-10-CM | POA: Diagnosis present

## 2023-04-11 DIAGNOSIS — E1310 Other specified diabetes mellitus with ketoacidosis without coma: Secondary | ICD-10-CM | POA: Diagnosis not present

## 2023-04-11 DIAGNOSIS — K2100 Gastro-esophageal reflux disease with esophagitis, without bleeding: Secondary | ICD-10-CM | POA: Diagnosis not present

## 2023-04-11 DIAGNOSIS — Z8673 Personal history of transient ischemic attack (TIA), and cerebral infarction without residual deficits: Secondary | ICD-10-CM

## 2023-04-11 LAB — CBC WITH DIFFERENTIAL/PLATELET
Abs Immature Granulocytes: 0.01 10*3/uL (ref 0.00–0.07)
Basophils Absolute: 0 10*3/uL (ref 0.0–0.1)
Basophils Relative: 0 %
Eosinophils Absolute: 0.1 10*3/uL (ref 0.0–0.5)
Eosinophils Relative: 2 %
HCT: 19.5 % — ABNORMAL LOW (ref 39.0–52.0)
Hemoglobin: 6.1 g/dL — CL (ref 13.0–17.0)
Immature Granulocytes: 0 %
Lymphocytes Relative: 21 %
Lymphs Abs: 1.3 10*3/uL (ref 0.7–4.0)
MCH: 32.3 pg (ref 26.0–34.0)
MCHC: 31.3 g/dL (ref 30.0–36.0)
MCV: 103.2 fL — ABNORMAL HIGH (ref 80.0–100.0)
Monocytes Absolute: 0.6 10*3/uL (ref 0.1–1.0)
Monocytes Relative: 10 %
Neutro Abs: 4 10*3/uL (ref 1.7–7.7)
Neutrophils Relative %: 67 %
Platelets: 218 10*3/uL (ref 150–400)
RBC: 1.89 MIL/uL — ABNORMAL LOW (ref 4.22–5.81)
RDW: 14.8 % (ref 11.5–15.5)
WBC: 6.1 10*3/uL (ref 4.0–10.5)
nRBC: 0 % (ref 0.0–0.2)

## 2023-04-11 LAB — PREPARE RBC (CROSSMATCH)

## 2023-04-11 LAB — CBC
HCT: 21.7 % — ABNORMAL LOW (ref 39.0–52.0)
Hemoglobin: 6.8 g/dL — CL (ref 13.0–17.0)
MCH: 31.2 pg (ref 26.0–34.0)
MCHC: 31.3 g/dL (ref 30.0–36.0)
MCV: 99.5 fL (ref 80.0–100.0)
Platelets: 190 10*3/uL (ref 150–400)
RBC: 2.18 MIL/uL — ABNORMAL LOW (ref 4.22–5.81)
RDW: 16.5 % — ABNORMAL HIGH (ref 11.5–15.5)
WBC: 4.9 10*3/uL (ref 4.0–10.5)
nRBC: 0 % (ref 0.0–0.2)

## 2023-04-11 LAB — IRON AND TIBC
Iron: 18 ug/dL — ABNORMAL LOW (ref 45–182)
Saturation Ratios: 5 % — ABNORMAL LOW (ref 17.9–39.5)
TIBC: 401 ug/dL (ref 250–450)
UIBC: 383 ug/dL

## 2023-04-11 LAB — COMPREHENSIVE METABOLIC PANEL
ALT: 20 U/L (ref 0–44)
AST: 25 U/L (ref 15–41)
Albumin: 3.5 g/dL (ref 3.5–5.0)
Alkaline Phosphatase: 189 U/L — ABNORMAL HIGH (ref 38–126)
Anion gap: 10 (ref 5–15)
BUN: 28 mg/dL — ABNORMAL HIGH (ref 8–23)
CO2: 23 mmol/L (ref 22–32)
Calcium: 8.8 mg/dL — ABNORMAL LOW (ref 8.9–10.3)
Chloride: 106 mmol/L (ref 98–111)
Creatinine, Ser: 1.63 mg/dL — ABNORMAL HIGH (ref 0.61–1.24)
GFR, Estimated: 41 mL/min — ABNORMAL LOW (ref >60)
Glucose, Bld: 140 mg/dL — ABNORMAL HIGH (ref 70–99)
Potassium: 3.9 mmol/L (ref 3.5–5.1)
Sodium: 139 mmol/L (ref 135–145)
Total Bilirubin: 0.6 mg/dL (ref 0.3–1.2)
Total Protein: 5.9 g/dL — ABNORMAL LOW (ref 6.5–8.1)

## 2023-04-11 LAB — HEMOGLOBIN A1C
Hgb A1c MFr Bld: 5.9 % — ABNORMAL HIGH (ref 4.8–5.6)
Mean Plasma Glucose: 122.63 mg/dL

## 2023-04-11 LAB — ABO/RH: ABO/RH(D): A POS

## 2023-04-11 LAB — TROPONIN I (HIGH SENSITIVITY)
Troponin I (High Sensitivity): 18 ng/L — ABNORMAL HIGH (ref <18)
Troponin I (High Sensitivity): 47 ng/L — ABNORMAL HIGH (ref <18)

## 2023-04-11 LAB — GLUCOSE, CAPILLARY
Glucose-Capillary: 143 mg/dL — ABNORMAL HIGH (ref 70–99)
Glucose-Capillary: 144 mg/dL — ABNORMAL HIGH (ref 70–99)

## 2023-04-11 LAB — BPAM RBC

## 2023-04-11 LAB — TYPE AND SCREEN: Unit division: 0

## 2023-04-11 LAB — VITAMIN B12: Vitamin B-12: 1135 pg/mL — ABNORMAL HIGH (ref 180–914)

## 2023-04-11 LAB — CBG MONITORING, ED: Glucose-Capillary: 143 mg/dL — ABNORMAL HIGH (ref 70–99)

## 2023-04-11 LAB — FERRITIN: Ferritin: 16 ng/mL — ABNORMAL LOW (ref 24–336)

## 2023-04-11 MED ORDER — SODIUM CHLORIDE 0.9% FLUSH
3.0000 mL | INTRAVENOUS | Status: DC | PRN
  Administered 2023-04-11: 3 mL via INTRAVENOUS

## 2023-04-11 MED ORDER — INSULIN ASPART 100 UNIT/ML IJ SOLN: 0.0000 [IU] | Freq: Every day | INTRAMUSCULAR | Status: DC

## 2023-04-11 MED ORDER — SODIUM CHLORIDE 0.9% IV SOLUTION: Freq: Once | INTRAVENOUS | Status: DC

## 2023-04-11 MED ORDER — ALUM & MAG HYDROXIDE-SIMETH 200-200-20 MG/5ML PO SUSP
30.0000 mL | Freq: Once | ORAL | Status: AC
  Administered 2023-04-11: 30 mL via ORAL
  Filled 2023-04-11: qty 30

## 2023-04-11 MED ORDER — VITAMIN B-12 1000 MCG PO TABS
1000.0000 ug | ORAL_TABLET | Freq: Every day | ORAL | Status: DC
  Administered 2023-04-11 – 2023-04-13 (×3): 1000 ug via ORAL
  Filled 2023-04-11 (×3): qty 1

## 2023-04-11 MED ORDER — ONDANSETRON HCL 4 MG PO TABS: 4.0000 mg | ORAL_TABLET | Freq: Four times a day (QID) | ORAL | Status: DC | PRN

## 2023-04-11 MED ORDER — INSULIN ASPART 100 UNIT/ML IJ SOLN
0.0000 [IU] | Freq: Three times a day (TID) | INTRAMUSCULAR | Status: DC
  Administered 2023-04-13: 2 [IU] via SUBCUTANEOUS

## 2023-04-11 MED ORDER — PEG 3350-KCL-NABCB-NACL-NASULF 236 G PO SOLR
4000.0000 mL | Freq: Once | ORAL | Status: AC
  Administered 2023-04-11: 4000 mL via ORAL
  Filled 2023-04-11: qty 4000

## 2023-04-11 MED ORDER — LIDOCAINE VISCOUS HCL 2 % MT SOLN
15.0000 mL | Freq: Once | OROMUCOSAL | Status: AC
  Administered 2023-04-11: 15 mL via ORAL
  Filled 2023-04-11: qty 15

## 2023-04-11 MED ORDER — SODIUM CHLORIDE 0.9 % IV SOLN
250.0000 mL | INTRAVENOUS | Status: DC | PRN
  Administered 2023-04-11: 250 mL via INTRAVENOUS

## 2023-04-11 MED ORDER — ACETAMINOPHEN 650 MG RE SUPP: 650.0000 mg | Freq: Four times a day (QID) | RECTAL | Status: DC | PRN

## 2023-04-11 MED ORDER — PANTOPRAZOLE SODIUM 40 MG IV SOLR
40.0000 mg | Freq: Two times a day (BID) | INTRAVENOUS | Status: DC
  Administered 2023-04-11 – 2023-04-13 (×5): 40 mg via INTRAVENOUS
  Filled 2023-04-11 (×5): qty 10

## 2023-04-11 MED ORDER — BISACODYL 5 MG PO TBEC
10.0000 mg | DELAYED_RELEASE_TABLET | Freq: Once | ORAL | Status: AC
  Administered 2023-04-11: 10 mg via ORAL
  Filled 2023-04-11: qty 2

## 2023-04-11 MED ORDER — ONDANSETRON HCL 4 MG/2ML IJ SOLN
4.0000 mg | Freq: Four times a day (QID) | INTRAMUSCULAR | Status: DC | PRN
  Administered 2023-04-12: 4 mg via INTRAVENOUS
  Filled 2023-04-11: qty 2

## 2023-04-11 MED ORDER — ATORVASTATIN CALCIUM 40 MG PO TABS
80.0000 mg | ORAL_TABLET | Freq: Every day | ORAL | Status: DC
  Administered 2023-04-11 – 2023-04-13 (×3): 80 mg via ORAL
  Filled 2023-04-11 (×3): qty 2

## 2023-04-11 MED ORDER — FUROSEMIDE 10 MG/ML IJ SOLN
40.0000 mg | Freq: Once | INTRAMUSCULAR | Status: AC
  Administered 2023-04-11: 40 mg via INTRAVENOUS
  Filled 2023-04-11: qty 4

## 2023-04-11 MED ORDER — TAMSULOSIN HCL 0.4 MG PO CAPS
0.4000 mg | ORAL_CAPSULE | Freq: Every day | ORAL | Status: DC
  Administered 2023-04-12 – 2023-04-13 (×2): 0.4 mg via ORAL
  Filled 2023-04-11 (×3): qty 1

## 2023-04-11 MED ORDER — SODIUM CHLORIDE 0.9% FLUSH
3.0000 mL | Freq: Two times a day (BID) | INTRAVENOUS | Status: DC
  Administered 2023-04-12 – 2023-04-13 (×3): 3 mL via INTRAVENOUS

## 2023-04-11 MED ORDER — ACETAMINOPHEN 325 MG PO TABS: 650.0000 mg | ORAL_TABLET | Freq: Four times a day (QID) | ORAL | Status: DC | PRN

## 2023-04-11 NOTE — Telephone Encounter (Signed)
noted 

## 2023-04-11 NOTE — Assessment & Plan Note (Addendum)
-  Continue PPI, currently twice a day. -Lifestyle modifications discussed with patient.

## 2023-04-11 NOTE — ED Triage Notes (Signed)
Pt c/o left side chest pain and states he took a nitro last night that did relieve the pain  Pt states he has been feeling sob for the last few days

## 2023-04-11 NOTE — Consult Note (Signed)
Gastroenterology Consult   Referring Provider: No ref. provider found Primary Care Physician:  [facility] Primary Gastroenterologist:  formerly Dr. Karilyn Cota  Patient ID: Martin Frazier.; [id number]; 1934/09/23   Admit date: 04/11/2023  LOS: 0 days   Date of Consultation: 04/11/2023  Reason for Consultation:  anemia, heme + stool    History of Present Illness   Martin Wilbourne. is a 89 y.o. male with past medical history significant for CAD (status post cath with stent placement April 2023), diabetes mellitus, history of stroke, hypertension, stage III CKD, GERD who presented to the emergency department with progressive dyspnea on exertion, fatigue, chest pain.  Patient recently seen in our office, referred by the [facility], for further evaluation of GERD, right upper quadrant pain, IDA.  Patient reports longstanding history of stomach issues.  He feels like symptoms have gotten worse over the years with progressive scoliosis/kyphosis. Feels like his abdomen is compressed by his anatomy. Denied any weight loss.  Appetite declined.  He was having some upper abdominal pain but started taking vinegar mixed with water with resolution of his symptoms.  When he goes out to eat he uses digestive enzymes, easier to take than vinegar.  Reported heartburn controlled on pantoprazole 40 mg daily.  Reported intermittent dysphagia to solid foods.  Esophageal dilation in 2020 seem to be helpful.  He did report constipation.  No melena or rectal bleeding at that time.  Stools dark on iron.  Lab work available from January 2024 with a hemoglobin of 8.8, ferritin 29, normal B12 and folate.  In the ED: BUN 28, creatinine 1.63, alkaline phosphatase 189,Hemoglobin 6.1, MCV 103.2, iron 18, iron sats 5%, TIBC 401, ferritin 16, B12 1135, troponin 18--> 47.  Chest x-ray with no signs of pulmonary edema or focal pulmonary consolidation.  Findings suggestive of COPD.  Today: Patient reports developing chest pain  last night which radiated into his arms bilaterally.  Initially tried Tums without relief.  Took nitroglycerin which seemed to help.  He had recurrent symptoms this morning which prompted ED evaluation.  He has had some lower extremity edema and has been taking Lasix 20 mg every other day.  He has chronic back pain. Over several days he has noted increased fatigue/weakness and has not felt well at all. He is having more burning in the chest and into the abdomen even on PPI, vinegar and having to take a lot of TUMs. Also took Pepto. Has had some black stools he believes around that time. He continues to have issues with harder stools. Complains of intermittent solid food dysphagia. Denies NSAIDs.  Has been on Plavix and aspirin.  Patient has been evaluated by cardiology, bump in troponin felt to be due to demand ischemia in the setting of anemia.  Plans to check again later today.  No further cardiac testing anticipated given need for endoscopic evaluation.  EGD April 2020: -Normal esophagus -Z-line regular, 43 cm from the incisors -Erythematous and edematous mucosa in the prepyloric region of the stomach status post biopsy, reactive gastropathy, no H. pylori. -Normal duodenal bulb and second portion of duodenum   Attempted colonoscopy in February 2006: Tortuous very noncompliant sigmoid colon preventing complete exam.  Sigmoid colon and rectal mucosa normal.  Small external hemorrhoids.   Prior to Admission medications   Medication Sig Start Date End Date Taking? Authorizing Provider  acetaminophen (TYLENOL) 325 MG tablet Take 2 tablets (650 mg total) by mouth every 4 (four) hours as  needed for mild pain (or temp > 37.5 C (99.5 F)). 04/05/20  Yes Angiulli, Mcarthur Rossettianiel J, PA-C  aspirin EC 81 MG EC tablet Take 1 tablet (81 mg total) by mouth daily. Swallow whole. 04/22/22  Yes Burnadette PopAdhikari, Amrit, MD  atorvastatin (LIPITOR) 80 MG tablet Take 1 tablet (80 mg total) by mouth daily. 05/09/22  Yes BranchDorothe Pea, Jonathan F,  MD  Cholecalciferol (VITAMIN D) 50 MCG (2000 UT) CAPS Take 1 capsule (2,000 Units total) by mouth daily. 04/05/20  Yes Angiulli, Mcarthur Rossettianiel J, PA-C  clopidogrel (PLAVIX) 75 MG tablet Take 1 tablet (75 mg total) by mouth daily. 04/21/22  Yes Burnadette PopAdhikari, Amrit, MD  cyanocobalamin (VITAMIN B12) 500 MCG tablet Take 500 mcg by mouth daily.   Yes [provider]  ferrous sulfate 325 (65 FE) MG EC tablet Take 325 mg by mouth daily with breakfast.   Yes [provider]  furosemide (LASIX) 20 MG tablet Take 1 tablet (20 mg total) by mouth every other day. Patient taking differently: Take 20 mg by mouth daily. 03/04/23 02/27/24 Yes Branch, Dorothe PeaJonathan F, MD  glimepiride (AMARYL) 1 MG tablet Take 1 tablet (1 mg total) by mouth daily with breakfast. 04/06/20  Yes Angiulli, Mcarthur Rossettianiel J, PA-C  latanoprost (XALATAN) 0.005 % ophthalmic solution Place 1 drop into both eyes at bedtime. 04/05/20  Yes Angiulli, Mcarthur Rossettianiel J, PA-C  Multiple Vitamins-Minerals (ONE-A-DAY 50 PLUS PO) Take by mouth.   Yes [provider]  nitroGLYCERIN (NITROSTAT) 0.4 MG SL tablet Place 1 tablet (0.4 mg total) under the tongue every 5 (five) minutes as needed for chest pain. 03/27/23 03/26/24 Yes BranchDorothe Pea, Jonathan F, MD  pantoprazole (PROTONIX) 40 MG tablet Take 40 mg by mouth daily.   Yes [provider]  tamsulosin (FLOMAX) 0.4 MG CAPS capsule Take 1 capsule (0.4 mg total) by mouth daily. 04/05/20  Yes Angiulli, Mcarthur Rossettianiel J, PA-C  vitamin B-12 (CYANOCOBALAMIN) 1000 MCG tablet Take 1 tablet (1,000 mcg total) by mouth daily. 04/05/20  Yes Angiulli, Mcarthur Rossettianiel J, PA-C    Current Facility-Administered Medications  Medication Dose Route Frequency Provider Last Rate Last Admin   0.9 %  sodium chloride infusion (Manually program via Guardrails IV Fluids)   Intravenous Once Kommor, Madison, MD       0.9 %  sodium chloride infusion  250 mL Intravenous PRN Vassie LollMadera, Carlos, MD       acetaminophen (TYLENOL) tablet 650 mg  650 mg Oral Q6H PRN Vassie LollMadera,  Carlos, MD       Or   acetaminophen (TYLENOL) suppository 650 mg  650 mg Rectal Q6H PRN Vassie LollMadera, Carlos, MD       atorvastatin (LIPITOR) tablet 80 mg  80 mg Oral Daily Vassie LollMadera, Carlos, MD       cyanocobalamin (VITAMIN B12) tablet 1,000 mcg  1,000 mcg Oral Daily Vassie LollMadera, Carlos, MD       insulin aspart (novoLOG) injection 0-5 Units  0-5 Units Subcutaneous QHS Vassie LollMadera, Carlos, MD       insulin aspart (novoLOG) injection 0-6 Units  0-6 Units Subcutaneous TID WC Vassie LollMadera, Carlos, MD       ondansetron [facility](ZOFRAN) tablet 4 mg  4 mg Oral Q6H PRN Vassie LollMadera, Carlos, MD       Or   ondansetron [facility](ZOFRAN) injection 4 mg  4 mg Intravenous Q6H PRN Vassie LollMadera, Carlos, MD       pantoprazole (PROTONIX) injection 40 mg  40 mg Intravenous Q12H Kommor, Madison, MD       sodium chloride flush (NS) 0.9 % injection  3 mL  3 mL Intravenous Q12H Vassie Loll, MD       sodium chloride flush (NS) 0.9 % injection 3 mL  3 mL Intravenous PRN Vassie Loll, MD       tamsulosin (FLOMAX) capsule 0.4 mg  0.4 mg Oral Daily Vassie Loll, MD       Current Outpatient Medications  Medication Sig Dispense Refill   acetaminophen (TYLENOL) 325 MG tablet Take 2 tablets (650 mg total) by mouth every 4 (four) hours as needed for mild pain (or temp > 37.5 C (99.5 F)).     aspirin EC 81 MG EC tablet Take 1 tablet (81 mg total) by mouth daily. Swallow whole. 30 tablet 1   atorvastatin (LIPITOR) 80 MG tablet Take 1 tablet (80 mg total) by mouth daily. 90 tablet 3   Cholecalciferol (VITAMIN D) 50 MCG (2000 UT) CAPS Take 1 capsule (2,000 Units total) by mouth daily. 30 capsule 0   clopidogrel (PLAVIX) 75 MG tablet Take 1 tablet (75 mg total) by mouth daily. 30 tablet 1   cyanocobalamin (VITAMIN B12) 500 MCG tablet Take 500 mcg by mouth daily.     ferrous sulfate 325 (65 FE) MG EC tablet Take 325 mg by mouth daily with breakfast.     furosemide (LASIX) 20 MG tablet Take 1 tablet (20 mg total) by mouth every other day. (Patient taking differently: Take 20 mg  by mouth daily.) 45 tablet 3   glimepiride (AMARYL) 1 MG tablet Take 1 tablet (1 mg total) by mouth daily with breakfast. 30 tablet 0   latanoprost (XALATAN) 0.005 % ophthalmic solution Place 1 drop into both eyes at bedtime. 2.5 mL 12   Multiple Vitamins-Minerals (ONE-A-DAY 50 PLUS PO) Take by mouth.     nitroGLYCERIN (NITROSTAT) 0.4 MG SL tablet Place 1 tablet (0.4 mg total) under the tongue every 5 (five) minutes as needed for chest pain. 25 tablet 3   pantoprazole (PROTONIX) 40 MG tablet Take 40 mg by mouth daily.     tamsulosin (FLOMAX) 0.4 MG CAPS capsule Take 1 capsule (0.4 mg total) by mouth daily. 30 capsule 0   vitamin B-12 (CYANOCOBALAMIN) 1000 MCG tablet Take 1 tablet (1,000 mcg total) by mouth daily. 30 tablet 0    Allergies as of 04/11/2023 - Review Complete 04/11/2023  Allergen Reaction Noted   Sulfa antibiotics Nausea And Vomiting 12/31/2013    Past Medical History:  Diagnosis Date   CAD (coronary artery disease)    Collapsed lung    left   Diabetes mellitus without complication    over 10 yrs   GERD (gastroesophageal reflux disease)    History of CVA (cerebrovascular accident)    Hypertension    Kidney stones     Past Surgical History:  Procedure Laterality Date   APPENDECTOMY     BIOPSY  04/01/2019   Procedure: BIOPSY;  Surgeon: Malissa Hippo, MD;  Location: [facility];  Service: Endoscopy;;  gastric   CORONARY STENT INTERVENTION N/A 04/20/2022   Procedure: CORONARY STENT INTERVENTION;  Surgeon: Marykay Lex, MD;  Location: [facility] INVASIVE CV LAB;  Service: Cardiovascular;  Laterality: N/A;   CYSTOSCOPY W/ URETERAL STENT PLACEMENT Right 04/03/2019   Procedure: CYSTOSCOPY WITH RETROGRADE PYELOGRAM/URETERAL STENT PLACEMENT;  Surgeon: Marcine Matar, MD;  Location: [facility] ORS;  Service: Urology;  Laterality: Right;   ESOPHAGOGASTRODUODENOSCOPY N/A 04/01/2019   Procedure: ESOPHAGOGASTRODUODENOSCOPY (EGD);  Surgeon: Malissa Hippo, MD;  Location: [facility];   Service: Endoscopy;  Laterality: N/A;  10:55am   EXTRACORPOREAL SHOCK WAVE LITHOTRIPSY Right 04/16/2019   Procedure: EXTRACORPOREAL SHOCK WAVE LITHOTRIPSY (ESWL);  Surgeon: Jerilee Field, MD;  Location: [facility] ORS;  Service: Urology;  Laterality: Right;   HEMORRHOID SURGERY     KIDNEY STONE SURGERY     LEFT HEART CATH AND CORONARY ANGIOGRAPHY N/A 04/20/2022   Procedure: LEFT HEART CATH AND CORONARY ANGIOGRAPHY;  Surgeon: Marykay Lex, MD;  Location: [facility] INVASIVE CV LAB;  Service: Cardiovascular;  Laterality: N/A;   TONSILLECTOMY      Family History  Problem Relation Age of Onset   Heart attack Father     Social History   Socioeconomic History   Marital status: Married    Spouse name: Not on file   Number of children: Not on file   Years of education: Not on file   Highest education level: Not on file  Occupational History   Not on file  Tobacco Use   Smoking status: Former    Types: Cigarettes   Smokeless tobacco: Never   Tobacco comments:    quit 2006  Vaping Use   Vaping Use: Never used  Substance and Sexual Activity   Alcohol use: No   Drug use: No   Sexual activity: Not Currently  Other Topics Concern   Not on file  Social History Narrative   Not on file   Social Determinants of Health   Financial Resource Strain: Not on file  Food Insecurity: Not on file  Transportation Needs: Not on file  Physical Activity: Not on file  Stress: Not on file  Social Connections: Not on file  Intimate Partner Violence: Not on file     Review of System:   General: Negative for weight loss, fever, chills, +fatigue, +weakness. Eyes: Negative for vision changes.  ENT: Negative for hoarseness, nasal congestion. CV: Negative for palpitations. See hpi. + peripheral edema.  Respiratory: Negative for dyspnea at rest,  cough, sputum, wheezing. See hpi GI: See history of present illness. GU:  Negative for dysuria, hematuria, urinary incontinence, urinary frequency, nocturnal  urination.  MS: Negative for joint pain. + low back pain.  Derm: Negative for rash or itching.  Neuro: Negative for weakness, abnormal sensation, seizure, frequent headaches, memory loss, confusion.  Psych: Negative for anxiety, depression, suicidal ideation, hallucinations.  Endo: Negative for unusual weight change.  Heme: Negative for bruising or bleeding. Allergy: Negative for rash or hives.      Physical Examination:   Vital signs in last 24 hours: Temp:  [97.8 F (36.6 C)-98 F (36.7 C)] 97.8 F (36.6 C) (04/11 0929) Pulse Rate:  [85-94] 85 (04/11 0945) Resp:  [15-20] 15 (04/11 0945) BP: (137-150)/(64-97) 137/70 (04/11 0945) SpO2:  [98 %-100 %] 98 % (04/11 0945) Weight:  [62.8 kg] 62.8 kg (04/11 0738)    General: elderly somewhat frail appearing male, in no acute distress. Wife at bedside. Kyphosis noted Head: Normocephalic, atraumatic.   Eyes: Conjunctiva pink, no icterus. Mouth: Oropharyngeal mucosa moist and pink , no lesions erythema or exudate. Neck: Supple without thyromegaly, masses, or lymphadenopathy.  Lungs: Clear to auscultation bilaterally.  Heart: Regular rate and rhythm, no murmurs rubs or gallops.  Abdomen: Bowel sounds are normal, nontender, nondistended, no hepatosplenomegaly or masses, no abdominal bruits or hernia , no rebound or guarding.   Rectal: not performed Extremities: No lower extremity edema, clubbing, deformity.  Neuro: Alert and oriented x 4 , grossly normal neurologically.  Skin: Warm and dry, no rash or jaundice.   Psych: Alert  and cooperative, normal mood and affect.        Intake/Output from previous day: No intake/output data recorded. Intake/Output this shift: No intake/output data recorded.  Lab Results:   CBC Recent Labs    04/11/23 0732  WBC 6.1  HGB 6.1*  HCT 19.5*  MCV 103.2*  PLT 218   BMET Recent Labs    04/11/23 0732  NA 139  K 3.9  CL 106  CO2 23  GLUCOSE 140*  BUN 28*  CREATININE 1.63*  CALCIUM 8.8*    LFT Recent Labs    04/11/23 0732  BILITOT 0.6  ALKPHOS 189*  AST 25  ALT 20  PROT 5.9*  ALBUMIN 3.5    Lipase No results for input(s): "LIPASE" in the last 72 hours.  PT/INR No results for input(s): "LABPROT", "INR" in the last 72 hours.   Hepatitis Panel No results for input(s): "HEPBSAG", "HCVAB", "HEPAIGM", "HEPBIGM" in the last 72 hours.   Imaging Studies:   DG Chest 2 View  Result Date: 04/11/2023 CLINICAL DATA:  Chest pain EXAM: CHEST - 2 VIEW COMPARISON:  Previous studies including the examination of 04/17/2022 FINDINGS: Cardiac size is within normal limits. There are no signs of pulmonary edema or focal pulmonary consolidation. Increase in the AP diameter of chest suggests COPD. There is no pleural effusion or pneumothorax. IMPRESSION: There are no signs of pulmonary edema or focal pulmonary consolidation. Electronically Signed   By: Ernie Avena M.D.   On: 04/11/2023 07:59   DG Abd 1 View  Result Date: 04/01/2023 CLINICAL DATA:  Follow-up kidney stone EXAM: ABDOMEN - 1 VIEW COMPARISON:  08/23/2021 FINDINGS: 12 x 12 mm calculus RIGHT kidney. 4 mm calculus LEFT kidney. No additional urinary tract calcifications. Nonobstructive bowel gas pattern. Osseous demineralization with advanced degenerative disc and facet disease changes lumbar spine associated with dextroconvex scoliosis. Lung bases clear. IMPRESSION: BILATERAL renal calculi as above. Electronically Signed   By: Ulyses Southward M.D.   On: 04/01/2023 17:04   ECHOCARDIOGRAM COMPLETE  Result Date: 03/18/2023    ECHOCARDIOGRAM REPORT   Patient Name:   Martin Gullo. Date of Exam: 03/18/2023 Medical Rec #:  [id number]       Height:       63.0 in Accession #:    [id number]      Weight:       137.2 lb Date of Birth:  07/02/1934       BSA:          1.647 m Patient Age:    88 years        BP:           147/72 mmHg Patient Gender: M               HR:           81 bpm. [facility] Procedure: 2D Echo, Cardiac  Doppler and Color Doppler Indications:    SOB (shortness of breath)  History:        Patient has prior history of Echocardiogram examinations, most                 recent 04/18/2022. CHF, Stroke; Risk Factors:Hypertension,                 Diabetes and Dyslipidemia.  Sonographer:    Celesta Gentile RCS Referring Phys: [id number] Dorothe Pea BRANCH IMPRESSIONS  1. Left ventricular ejection fraction, by estimation, is 60 to 65%. The left ventricle has normal  function. The left ventricle demonstrates regional wall motion abnormalities, basal inferior hypokinesis. Left ventricular diastolic parameters are consistent with Grade I diastolic dysfunction (impaired relaxation).  2. Right ventricular systolic function is normal. The right ventricular size is normal. There is normal pulmonary artery systolic pressure. The estimated right ventricular systolic pressure is 20.6 mmHg.  3. Left atrial size was mild to moderately dilated.  4. The mitral valve is abnormal. Mild mitral valve regurgitation. No evidence of mitral stenosis. Moderate mitral annular calcification.  5. The aortic valve is tricuspid. Aortic valve regurgitation is mild. No aortic stenosis is present.  6. The inferior vena cava is normal in size with greater than 50% respiratory variability, suggesting right atrial pressure of 3 mmHg. Comparison(s): No significant change from prior study. FINDINGS  Left Ventricle: Left ventricular ejection fraction, by estimation, is 60 to 65%. The left ventricle has normal function. The left ventricle demonstrates regional wall motion abnormalities. The left ventricular internal cavity size was normal in size. There is no left ventricular hypertrophy. Left ventricular diastolic parameters are consistent with Grade I diastolic dysfunction (impaired relaxation).  LV Wall Scoring: The basal inferior segment is hypokinetic. Right Ventricle: The right ventricular size is normal. No increase in right ventricular wall thickness. Right  ventricular systolic function is normal. There is normal pulmonary artery systolic pressure. The tricuspid regurgitant velocity is 2.10 m/s, and  with an assumed right atrial pressure of 3 mmHg, the estimated right ventricular systolic pressure is 20.6 mmHg. Left Atrium: Left atrial size was mild to moderately dilated. Right Atrium: Right atrial size was normal in size. Pericardium: There is no evidence of pericardial effusion. Mitral Valve: The mitral valve is abnormal. Moderate mitral annular calcification. Mild mitral valve regurgitation. No evidence of mitral valve stenosis. Tricuspid Valve: The tricuspid valve is normal in structure. Tricuspid valve regurgitation is trivial. No evidence of tricuspid stenosis. Aortic Valve: The aortic valve is tricuspid. Aortic valve regurgitation is mild. No aortic stenosis is present. Pulmonic Valve: The pulmonic valve was normal in structure. Pulmonic valve regurgitation is trivial. No evidence of pulmonic stenosis. Aorta: The aortic root is normal in size and structure. Venous: The inferior vena cava is normal in size with greater than 50% respiratory variability, suggesting right atrial pressure of 3 mmHg. IAS/Shunts: No atrial level shunt detected by color flow Doppler.  LEFT VENTRICLE PLAX 2D LVIDd:         4.30 cm   Diastology LVIDs:         3.00 cm   LV e' medial:    4.24 cm/s LV PW:         1.10 cm   LV E/e' medial:  13.4 LV IVS:        1.10 cm   LV e' lateral:   7.18 cm/s LVOT diam:     2.00 cm   LV E/e' lateral: 7.9 LV SV:         63 LV SV Index:   38 LVOT Area:     3.14 cm  RIGHT VENTRICLE RV S prime:     10.40 cm/s TAPSE (M-mode): 1.8 cm LEFT ATRIUM           Index        RIGHT ATRIUM           Index LA diam:      3.30 cm 2.00 cm/m   RA Area:     16.30 cm LA Vol (A2C): 66.1 ml 40.12 ml/m  RA Volume:  37.50 ml  22.76 ml/m LA Vol (A4C): 57.0 ml 34.60 ml/m  AORTIC VALVE LVOT Vmax:   79.00 cm/s LVOT Vmean:  47.700 cm/s LVOT VTI:    0.199 m  AORTA Ao Root diam:  3.60 cm MITRAL VALVE                TRICUSPID VALVE MV Area (PHT): 1.78 cm     TR Peak grad:   17.6 mmHg MV Decel Time: 426 msec     TR Vmax:        210.00 cm/s MV E velocity: 57.00 cm/s MV A velocity: 121.00 cm/s  SHUNTS MV E/A ratio:  0.47         Systemic VTI:  0.20 m                             Systemic Diam: 2.00 cm Vishnu Priya Mallipeddi Electronically signed by Winfield Rast Mallipeddi Signature Date/Time: 03/18/2023/4:25:01 PM    Final   [6 week]  Assessment:   Pleasant 89 year old male with history of CAD (stenting April 2023), diabetes, GERD, stroke, hypertension, GERD, stage III CKD presenting with progressive dyspnea on exertion, chest pain.  Patient found to have a hemoglobin of 6.  Anemia: Hemoglobin of 11 in April 2023.  Down in the 8 range in January.  Patient seen recently as an outpatient, discussed updating labs at that time but he reported recent labs through the [place] which we were trying to obtain.  In the interim he completed iFOBT which was positive.  Presented this admission with hemoglobin of 6. Labs consistent with IDA. Suspect chronic occult GI bleeding as source of IDA. Would recommend colonoscopy and upper endoscopy during admission. Patient has history of incomplete colonoscopy remotely due to noncompliant sigmoid colon.  GERD/dysphagia/epigastric pain: patient reports improvement with vinegar/water before meals with regards to burning in his stomach and chest. Wife notes he is still having to use a lot of TUMs. He has had some RUQ pain as well. Continues to have solid food dysphagia. He may have reflux esophagitis, esophageal stricture, PUD. Biliary etiology not excluded. Recommend EGD initially.  Constipation:  B12 deficiency: Patient reports history of B12 deficiency, currently on oral supplement.  Current B12 level normal.  Plan:   Clear liquids. NPO after midnight. Transfuse as needed.  PPI BID.  He will need outpatient bowel regimen for constipation that is  appropriate in setting of CKD. Recommended colonoscopy/EGD/ED tomorrow.  I have discussed the risks, alternatives, benefits with regards to but not limited to the risk of reaction to medication, bleeding, infection, perforation and the patient is agreeable to proceed. Written consent to be obtained.    LOS: 0 days   We would like to thank you for the opportunity to participate in the care of Martin FrazierMarland Kitchen  Leanna Battles. [place] Boos [facility] [phone] 4/11/202412:02 PM

## 2023-04-11 NOTE — Assessment & Plan Note (Addendum)
-  Patient with underlying history of chronic anemia secondary to chronic renal failure; also with concerns for GI bleed -Endoscopic evaluation no revealing acute source of GI bleeding; colonoscopy with couple AVMs treated with ACP but no active bleed demonstrating stigmata of acute bleeding. -Patient will continue PPI twice a day; no NSAIDs and at time of discharge no more aspirin. -Safe to resume the use of Plavix for CVA prophylaxis on 04/15/2023. -Repeat CBC to follow hemoglobin trend as an outpatient and if needed pursuit capsule endoscopy evaluation.

## 2023-04-11 NOTE — ED Provider Notes (Signed)
[facility] AT [facility] Provider Note  CSN: [id number] Arrival date & time: 04/11/23 0720  Chief Complaint(s) Chest Pain  HPI Martin Frazier. is a 89 y.o. male with PMH CAD status post DES placement and multivessel disease, iron deficiency anemia, B12 deficiency, previous CVA, HTN, GERD who presents emergency department for evaluation of multiple complaints including chest pain shortness of breath and indigestion.  Patient states that symptoms have been progressively worsening over the last few days but last night had a pain stretch across his chest and into his armpit that he states feels similar to his previous episodes of indigestion.  He states that he took Tums without relief and took a nitroglycerin that led to complete relief of his pain last night.  Wife endorses the patient has been progressively more fatigued over the last few days with exertional shortness of breath.  Patient states that he takes iron and Pepto-Bismol which change the color of his stools so he is unable to comment on darker stools.  Here in the emergency department, patient does endorse some mild chest pain but denies abdominal pain, nausea, vomiting, headache, fever or other systemic symptoms.   Past Medical History Past Medical History:  Diagnosis Date   CAD (coronary artery disease)    Collapsed lung    left   Diabetes mellitus without complication    over 10 yrs   GERD (gastroesophageal reflux disease)    History of CVA (cerebrovascular accident)    Hypertension    Kidney stones    Patient Active Problem List   Diagnosis Date Noted   IDA (iron deficiency anemia) 03/27/2023   Dysphagia 03/27/2023   Bloating 03/27/2023   Abnormal cardiovascular stress test 04/20/2022   Chronic systolic CHF (congestive heart failure) 04/19/2022   HLD (hyperlipidemia) 04/19/2022   History of stroke 04/18/2022   Hypertensive crisis 04/18/2022   Chest pain 04/17/2022   Abnormality of gait  02/28/2021   Rotator cuff arthropathy of both shoulders 02/28/2021   Injury of right rotator cuff 08/25/2020   Benign essential HTN 05/31/2020   Chronic kidney disease, stage 3a (HCC) 05/31/2020   Slow transit constipation    Soreness of eye    AKI (acute kidney injury)    Controlled type 2 diabetes mellitus with hyperglycemia, without long-term current use of insulin    History of hypertension    Right thalamic infarction 03/25/2020   Acute CVA (cerebrovascular accident)    Benign prostatic hyperplasia    Diabetes mellitus type 2 in nonobese    Acute ischemic stroke 03/23/2020   Acute ischemic VBA thalamic stroke, right 03/22/2020   Abdominal pain, epigastric 03/26/2019   Abnormal CT of the abdomen 03/26/2019   Spinal stenosis of lumbar region with neurogenic claudication 05/21/2018   Unspecified constipation 07/22/2014   Diabetes (HCC) 07/22/2014   Essential hypertension, benign 07/22/2014   GERD (gastroesophageal reflux disease) 07/22/2014   Home Medication(s) Prior to Admission medications   Medication Sig Start Date End Date Taking? Authorizing Provider  acetaminophen (TYLENOL) 325 MG tablet Take 2 tablets (650 mg total) by mouth every 4 (four) hours as needed for mild pain (or temp > 37.5 C (99.5 F)). 04/05/20   Angiulli, Mcarthur Rossetti, PA-C  aspirin EC 81 MG EC tablet Take 1 tablet (81 mg total) by mouth daily. Swallow whole. 04/22/22   Burnadette Pop, MD  atorvastatin (LIPITOR) 80 MG tablet Take 1 tablet (80 mg total) by mouth daily. 05/09/22   Antoine Poche,  MD  Cholecalciferol (VITAMIN D) 50 MCG (2000 UT) CAPS Take 1 capsule (2,000 Units total) by mouth daily. 04/05/20   Angiulli, Mcarthur Rossettianiel J, PA-C  clopidogrel (PLAVIX) 75 MG tablet Take 1 tablet (75 mg total) by mouth daily. 04/21/22   Burnadette PopAdhikari, Amrit, MD  cyanocobalamin (VITAMIN B12) 500 MCG tablet Take 500 mcg by mouth daily.    [provider]  ferrous sulfate 325 (65 FE) MG EC tablet Take 325 mg by mouth daily with  breakfast.    [provider]  finasteride (PROSCAR) 5 MG tablet Take 5 mg by mouth daily.    [provider]  furosemide (LASIX) 20 MG tablet Take 1 tablet (20 mg total) by mouth every other day. 03/04/23 02/27/24  Antoine PocheBranch, Jonathan F, MD  glimepiride (AMARYL) 1 MG tablet Take 1 tablet (1 mg total) by mouth daily with breakfast. 04/06/20   Angiulli, Mcarthur Rossettianiel J, PA-C  latanoprost (XALATAN) 0.005 % ophthalmic solution Place 1 drop into both eyes at bedtime. 04/05/20   Angiulli, Mcarthur Rossettianiel J, PA-C  Multiple Vitamins-Minerals (ONE-A-DAY 50 PLUS PO) Take by mouth.    [provider]  nitroGLYCERIN (NITROSTAT) 0.4 MG SL tablet Place 1 tablet (0.4 mg total) under the tongue every 5 (five) minutes as needed for chest pain. 03/27/23 03/26/24  Antoine PocheBranch, Jonathan F, MD  pantoprazole (PROTONIX) 40 MG tablet Take 40 mg by mouth daily.    [provider]  tamsulosin (FLOMAX) 0.4 MG CAPS capsule Take 1 capsule (0.4 mg total) by mouth daily. 04/05/20   Angiulli, Mcarthur Rossettianiel J, PA-C  vitamin B-12 (CYANOCOBALAMIN) 1000 MCG tablet Take 1 tablet (1,000 mcg total) by mouth daily. 04/05/20   Angiulli, Mcarthur Rossettianiel J, PA-C                                                                                                                                    Past Surgical History Past Surgical History:  Procedure Laterality Date   APPENDECTOMY     BIOPSY  04/01/2019   Procedure: BIOPSY;  Surgeon: Malissa Hippoehman, Najeeb U, MD;  Location: [facility] SUITE;  Service: Endoscopy;;  gastric   CORONARY STENT INTERVENTION N/A 04/20/2022   Procedure: CORONARY STENT INTERVENTION;  Surgeon: Marykay LexHarding, David W, MD;  [facility];  Service: Cardiovascular;  Laterality: N/A;   CYSTOSCOPY W/ URETERAL STENT PLACEMENT Right 04/03/2019   Procedure: CYSTOSCOPY WITH RETROGRADE PYELOGRAM/URETERAL STENT PLACEMENT;  Surgeon: Marcine Matarahlstedt, Stephen, MD;  Location: [facility] ORS;  Service: Urology;  Laterality: Right;   ESOPHAGOGASTRODUODENOSCOPY N/A  04/01/2019   Procedure: ESOPHAGOGASTRODUODENOSCOPY (EGD);  Surgeon: Malissa Hippoehman, Najeeb U, MD;  Location: [facility];  Service: Endoscopy;  Laterality: N/A;  10:55am   EXTRACORPOREAL SHOCK WAVE LITHOTRIPSY Right 04/16/2019   Procedure: EXTRACORPOREAL SHOCK WAVE LITHOTRIPSY (ESWL);  Surgeon: Jerilee FieldEskridge, Matthew, MD;  Location: [facility] ORS;  Service: Urology;  Laterality: Right;   HEMORRHOID SURGERY     KIDNEY STONE SURGERY  LEFT HEART CATH AND CORONARY ANGIOGRAPHY N/A 04/20/2022   Procedure: LEFT HEART CATH AND CORONARY ANGIOGRAPHY;  Surgeon: Marykay Lex, MD;  Location: [facility] INVASIVE CV LAB;  Service: Cardiovascular;  Laterality: N/A;   TONSILLECTOMY     Family History Family History  Problem Relation Age of Onset   Heart attack Father     Social History Social History   Tobacco Use   Smoking status: Former    Types: Cigarettes   Smokeless tobacco: Never   Tobacco comments:    quit 2006  Vaping Use   Vaping Use: Never used  Substance Use Topics   Alcohol use: No   Drug use: No   Allergies Sulfa antibiotics  Review of Systems Review of Systems  Respiratory:  Positive for shortness of breath.   Cardiovascular:  Positive for chest pain.    Physical Exam Vital Signs  I have reviewed the triage vital signs BP (!) 150/97   Pulse 91   Resp 20   Ht 5\' 3"  (1.6 m)   Wt 62.8 kg   SpO2 100%   BMI 24.53 kg/m   Physical Exam Constitutional:      General: He is not in acute distress.    Appearance: Normal appearance.  HENT:     Head: Normocephalic and atraumatic.     Nose: No congestion or rhinorrhea.  Eyes:     General:        Right eye: No discharge.        Left eye: No discharge.     Extraocular Movements: Extraocular movements intact.     Pupils: Pupils are equal, round, and reactive to light.  Cardiovascular:     Rate and Rhythm: Normal rate and regular rhythm.     Heart sounds: No murmur heard. Pulmonary:     Effort: No respiratory distress.     Breath sounds: No  wheezing or rales.  Abdominal:     General: There is no distension.     Tenderness: There is no abdominal tenderness.  Musculoskeletal:        General: Normal range of motion.     Cervical back: Normal range of motion.  Skin:    General: Skin is warm and dry.     Coloration: Skin is pale.  Neurological:     General: No focal deficit present.     Mental Status: He is alert.     ED Results and Treatments Labs (all labs ordered are listed, but only abnormal results are displayed) Labs Reviewed  CBC WITH DIFFERENTIAL/PLATELET - Abnormal; Notable for the following components:      Result Value   RBC 1.89 (*)    Hemoglobin 6.1 (*)    HCT 19.5 (*)    MCV 103.2 (*)    All other components within normal limits  COMPREHENSIVE METABOLIC PANEL  IRON AND TIBC  FERRITIN  VITAMIN B12  TYPE AND SCREEN  PREPARE RBC (CROSSMATCH)  TROPONIN I (HIGH SENSITIVITY)  Radiology DG Chest 2 View  Result Date: 04/11/2023 CLINICAL DATA:  Chest pain EXAM: CHEST - 2 VIEW COMPARISON:  Previous studies including the examination of 04/17/2022 FINDINGS: Cardiac size is within normal limits. There are no signs of pulmonary edema or focal pulmonary consolidation. Increase in the AP diameter of chest suggests COPD. There is no pleural effusion or pneumothorax. IMPRESSION: There are no signs of pulmonary edema or focal pulmonary consolidation. Electronically Signed   By: Ernie Avena M.D.   On: 04/11/2023 07:59    Pertinent labs & imaging results that were available during my care of the patient were reviewed by me and considered in my medical decision making (see MDM for details).  Medications Ordered in ED Medications  0.9 %  sodium chloride infusion (Manually program via Guardrails IV Fluids) (has no administration in time range)  alum & mag hydroxide-simeth (MAALOX/MYLANTA)  200-200-20 MG/5ML suspension 30 mL (has no administration in time range)    And  lidocaine (XYLOCAINE) 2 % viscous mouth solution 15 mL (has no administration in time range)                                                                                                                                     Procedures .Critical Care  Performed by: Glendora Score, MD Authorized by: Glendora Score, MD   Critical care provider statement:    Critical care time (minutes):  30   Critical care was necessary to treat or prevent imminent or life-threatening deterioration of the following conditions: Critical anemia requiring blood transfusion.   Critical care was time spent personally by me on the following activities:  Development of treatment plan with patient or surrogate, discussions with consultants, evaluation of patient's response to treatment, examination of patient, ordering and review of laboratory studies, ordering and review of radiographic studies, ordering and performing treatments and interventions, pulse oximetry, re-evaluation of patient's condition and review of old charts   (including critical care time)  Medical Decision Making / ED Course   This patient presents to the ED for concern of chest pain, shortness of breath, this involves an extensive number of treatment options, and is a complaint that carries with it a high risk of complications and morbidity.  The differential diagnosis includes ACS, Aortic Dissection, Pneumothorax, Pneumonia, Esophageal Rupture, PE, Tamponade/Pericardial Effusion, pericarditis, esophageal spasm, dysrhythmia, GERD, costochondritis.  Peptic ulcer disease, upper GI bleed  MDM: Patient seen emergency room for evaluation of chest pain shortness of breath.  Physical exam largely unremarkable outside of skin pallor.  Laboratory evaluation with a new significant anemia to 6.1 with an MCV of 103.2.  BUN 28, creatinine 1.63, high-sensitivity troponin only  slightly elevated at 18.  Iron studies with a low iron and ferritin.  Vitamin B12 is elevated.  Chest x-ray unremarkable.  ECG with some possible slight lateral ST depression but is not drastically different than previous.  Fecal occult blood  test performed 10 days ago in GI office is positive but difficult to determine the true validity of this test given the patient does take iron daily and has been using Pepto-Bismol.  Given his long history of indigestion-like symptoms and now with a progressive worsening anemia, suspect peptic ulcer disease and upper GI bleed.  Patient given a GI cocktail with improvement and I spoke with Dr. Jena Gauss of GI who is recommending resuscitation with blood products and GI will evaluate inpatient.  Patient then admitted.   Additional history obtained: -Additional history obtained from wife -External records from outside source obtained and reviewed including: Chart review including previous notes, labs, imaging, consultation notes   Lab Tests: -I ordered, reviewed, and interpreted labs.   The pertinent results include:   Labs Reviewed  CBC WITH DIFFERENTIAL/PLATELET - Abnormal; Notable for the following components:      Result Value   RBC 1.89 (*)    Hemoglobin 6.1 (*)    HCT 19.5 (*)    MCV 103.2 (*)    All other components within normal limits  COMPREHENSIVE METABOLIC PANEL  IRON AND TIBC  FERRITIN  VITAMIN B12  TYPE AND SCREEN  PREPARE RBC (CROSSMATCH)  TROPONIN I (HIGH SENSITIVITY)      EKG   EKG Interpretation  Date/Time:  Thursday April 11 2023 07:30:47 EDT Ventricular Rate:  96 PR Interval:  176 QRS Duration: 98 QT Interval:  368 QTC Calculation: 465 R Axis:   46 Text Interpretation: Sinus rhythm mild lateral ST deppression Confirmed by Kaytlen Lightsey ([id number]) on 04/11/2023 7:47:01 AM         Imaging Studies ordered: I ordered imaging studies including chest x-ray I independently visualized and interpreted imaging. I agree with the  radiologist interpretation   Medicines ordered and prescription drug management: Meds ordered this encounter  Medications   0.9 %  sodium chloride infusion (Manually program via Guardrails IV Fluids)   AND Linked Order Group    alum & mag hydroxide-simeth (MAALOX/MYLANTA) 200-200-20 MG/5ML suspension 30 mL    lidocaine (XYLOCAINE) 2 % viscous mouth solution 15 mL    -I have reviewed the patients home medicines and have made adjustments as needed  Critical interventions Blood transfusion, PPI  Consultations Obtained: I requested consultation with the gastroenterologist Dr. Vinnie Level,  and discussed lab and imaging findings as well as pertinent plan - they recommend: Inpatient hospital evaluation   Cardiac Monitoring: The patient was maintained on a cardiac monitor.  I personally viewed and interpreted the cardiac monitored which showed an underlying rhythm of: NSR  Social Determinants of Health:  Factors impacting patients care include: none   Reevaluation: After the interventions noted above, I reevaluated the patient and found that they have :stayed the same  Co morbidities that complicate the patient evaluation  Past Medical History:  Diagnosis Date   CAD (coronary artery disease)    Collapsed lung    left   Diabetes mellitus without complication    over 10 yrs   GERD (gastroesophageal reflux disease)    History of CVA (cerebrovascular accident)    Hypertension    Kidney stones       Dispostion: I considered admission for this patient, and given new critical anemia and suspected upper GI bleed patient require hospital admission     Final Clinical Impression(s) / ED Diagnoses Final diagnoses:  None     @    Glendora Score, MD 04/11/23 [phone]

## 2023-04-11 NOTE — Assessment & Plan Note (Addendum)
-  Resume home oral hypoglycemic agents. -A1c 6.3 recently -Continue to follow CBGs fluctuation and adjust hypoglycemic regimen as required.

## 2023-04-11 NOTE — Consult Note (Addendum)
Cardiology Consultation   Patient ID: Martin Frazier. MRN: [id number]; DOB: 1934-09-10  Admit date: 04/11/2023 Date of Consult: 04/11/2023  PCP:  [facility]   [facility] Providers Cardiologist:  Dina Rich, MD        Patient Profile:   Martin Guillette. is a 89 y.o. male with a hx of CAD (s/p cath in 03/2022 with DES to LAD, residual ramus disease medically managed and noted to have an occluded RPAV with left to right collaterals), HLD, Type 2 DM, GERD, Stage 3 CKD, prior tobacco use and prior CVA (right thalamic infarction in 02/2020) who is being seen 04/11/2023 for the evaluation of preoperative cardiac clearance for endoscopy at the request of Dr. Gwenlyn Perking.  History of Present Illness:   Martin Frazier was recently examined by Dr. Wyline Mood on 03/27/2023 and had been prescribed Lasix 20 mg daily for lower extremity edema but was not taking this routinely. He was encouraged to resume Lasix 20 mg every other day. Given that he was going to be a year out from intervention in 04/2023, was recommended would likely stop ASA on 04/21/2023 and continue Plavix as monotherapy.  He presented to [facility] ED this morning for evaluation of chest pain and shortness of breath for the past few days. In talking the patient and his wife today, he reports having worsening dyspnea on exertion and fatigue for the past several weeks. Last night, he developed chest discomfort which radiated to his arms bilaterally and he took Tums without improvement but did take a nitroglycerin and pain improved. He had recurrent symptoms this morning which prompted him to come to the ED for further evaluation. He denies any specific orthopnea or PND.  Does have lower extremity edema and has been taking Lasix 20 mg every other day as he experiences too frequent of urination with taking daily. Denies any pain currently. Says his most limiting symptom is usually back pain. Reports he has noticed episodes of  melena but thought this was secondary to the use of Pepto-Bismol. Reports frequent acid reflux as well and he has been taking TUMS and consuming vinegar routinely. Denies any NSAID use. Takes Tylenol for back pain.  Initial labs showed WBC 6.1, Hgb 6.1, platelets 218, Na+ 139, K+ 3.9 and creatinine 1.63 (close to baseline).  Ferritin 16. Initial and repeat Hs Troponin elevated at 18 and 47. CXR with no acute abnormalities. EKG shows NSR, HR 96 with LVH and ST depression along the lateral leads.    Past Medical History:  Diagnosis Date   CAD (coronary artery disease)    Collapsed lung    left   Diabetes mellitus without complication    over 10 yrs   GERD (gastroesophageal reflux disease)    History of CVA (cerebrovascular accident)    Hypertension    Kidney stones     Past Surgical History:  Procedure Laterality Date   APPENDECTOMY     BIOPSY  04/01/2019   Procedure: BIOPSY;  Surgeon: Malissa Hippo, MD;  Location: [facility];  Service: Endoscopy;;  gastric   CORONARY STENT INTERVENTION N/A 04/20/2022   Procedure: CORONARY STENT INTERVENTION;  Surgeon: Marykay Lex, MD;  Location: [facility] INVASIVE CV LAB;  Service: Cardiovascular;  Laterality: N/A;   CYSTOSCOPY W/ URETERAL STENT PLACEMENT Right 04/03/2019   Procedure: CYSTOSCOPY WITH RETROGRADE PYELOGRAM/URETERAL STENT PLACEMENT;  Surgeon: Marcine Matar, MD;  Location: [facility] ORS;  Service: Urology;  Laterality: Right;   ESOPHAGOGASTRODUODENOSCOPY N/A  04/01/2019   Procedure: ESOPHAGOGASTRODUODENOSCOPY (EGD);  Surgeon: Malissa Hippo, MD;  Location: [facility];  Service: Endoscopy;  Laterality: N/A;  10:55am   EXTRACORPOREAL SHOCK WAVE LITHOTRIPSY Right 04/16/2019   Procedure: EXTRACORPOREAL SHOCK WAVE LITHOTRIPSY (ESWL);  Surgeon: Jerilee Field, MD;  Location: [facility] ORS;  Service: Urology;  Laterality: Right;   HEMORRHOID SURGERY     KIDNEY STONE SURGERY     LEFT HEART CATH AND CORONARY ANGIOGRAPHY N/A 04/20/2022   Procedure: LEFT  HEART CATH AND CORONARY ANGIOGRAPHY;  Surgeon: Marykay Lex, MD;  Location: [facility] INVASIVE CV LAB;  Service: Cardiovascular;  Laterality: N/A;   TONSILLECTOMY       Home Medications:  Prior to Admission medications   Medication Sig Start Date End Date Taking? Authorizing Provider  acetaminophen (TYLENOL) 325 MG tablet Take 2 tablets (650 mg total) by mouth every 4 (four) hours as needed for mild pain (or temp > 37.5 C (99.5 F)). 04/05/20  Yes Angiulli, Mcarthur Rossetti, PA-C  aspirin EC 81 MG EC tablet Take 1 tablet (81 mg total) by mouth daily. Swallow whole. 04/22/22  Yes Burnadette Pop, MD  atorvastatin (LIPITOR) 80 MG tablet Take 1 tablet (80 mg total) by mouth daily. 05/09/22  Yes BranchDorothe Pea, MD  Cholecalciferol (VITAMIN D) 50 MCG (2000 UT) CAPS Take 1 capsule (2,000 Units total) by mouth daily. 04/05/20  Yes Angiulli, Mcarthur Rossetti, PA-C  clopidogrel (PLAVIX) 75 MG tablet Take 1 tablet (75 mg total) by mouth daily. 04/21/22  Yes Burnadette Pop, MD  cyanocobalamin (VITAMIN B12) 500 MCG tablet Take 500 mcg by mouth daily.   Yes [provider]  ferrous sulfate 325 (65 FE) MG EC tablet Take 325 mg by mouth daily with breakfast.   Yes [provider]  furosemide (LASIX) 20 MG tablet Take 1 tablet (20 mg total) by mouth every other day. Patient taking differently: Take 20 mg by mouth daily. 03/04/23 02/27/24 Yes Cherita Hebel, Dorothe Pea, MD  glimepiride (AMARYL) 1 MG tablet Take 1 tablet (1 mg total) by mouth daily with breakfast. 04/06/20  Yes Angiulli, Mcarthur Rossetti, PA-C  latanoprost (XALATAN) 0.005 % ophthalmic solution Place 1 drop into both eyes at bedtime. 04/05/20  Yes Angiulli, Mcarthur Rossetti, PA-C  Multiple Vitamins-Minerals (ONE-A-DAY 50 PLUS PO) Take by mouth.   Yes [provider]  nitroGLYCERIN (NITROSTAT) 0.4 MG SL tablet Place 1 tablet (0.4 mg total) under the tongue every 5 (five) minutes as needed for chest pain. 03/27/23 03/26/24 Yes BranchDorothe Pea, MD  pantoprazole (PROTONIX) 40  MG tablet Take 40 mg by mouth daily.   Yes [provider]  tamsulosin (FLOMAX) 0.4 MG CAPS capsule Take 1 capsule (0.4 mg total) by mouth daily. 04/05/20  Yes Angiulli, Mcarthur Rossetti, PA-C  vitamin B-12 (CYANOCOBALAMIN) 1000 MCG tablet Take 1 tablet (1,000 mcg total) by mouth daily. 04/05/20  Yes Angiulli, Mcarthur Rossetti, PA-C    Inpatient Medications: Scheduled Meds:  sodium chloride   Intravenous Once   atorvastatin  80 mg Oral Daily   cyanocobalamin  1,000 mcg Oral Daily   insulin aspart  0-5 Units Subcutaneous QHS   insulin aspart  0-6 Units Subcutaneous TID WC   pantoprazole (PROTONIX) IV  40 mg Intravenous Q12H   sodium chloride flush  3 mL Intravenous Q12H   tamsulosin  0.4 mg Oral Daily   Continuous Infusions:  sodium chloride     PRN Meds: sodium chloride, acetaminophen **OR** acetaminophen, ondansetron **OR** ondansetron (ZOFRAN) IV, sodium chloride flush  Allergies:    Allergies  Allergen Reactions   Sulfa Antibiotics Nausea And Vomiting    Social History:   Social History   Socioeconomic History   Marital status: Married    Spouse name: Not on file   Number of children: Not on file   Years of education: Not on file   Highest education level: Not on file  Occupational History   Not on file  Tobacco Use   Smoking status: Former    Types: Cigarettes   Smokeless tobacco: Never   Tobacco comments:    quit 2006  Vaping Use   Vaping Use: Never used  Substance and Sexual Activity   Alcohol use: No   Drug use: No   Sexual activity: Not Currently  Other Topics Concern   Not on file  Social History Narrative   Not on file   Social Determinants of Health   Financial Resource Strain: Not on file  Food Insecurity: Not on file  Transportation Needs: Not on file  Physical Activity: Not on file  Stress: Not on file  Social Connections: Not on file  Intimate Partner Violence: Not on file    Family History:    Family History  Problem Relation Age of Onset    Heart attack Father      ROS:  Please see the history of present illness.   All other ROS reviewed and negative.     Physical Exam/Data:   Vitals:   04/11/23 0929 04/11/23 0934 04/11/23 0935 04/11/23 0945  BP:  (!) 140/64 (!) 140/64 137/70  Pulse: 94  94 85  Resp: 17  16 15   Temp: 97.8 F (36.6 C)     TempSrc: Oral     SpO2: 98%  99% 98%  Weight:      Height:       No intake or output data in the 24 hours ending 04/11/23 1042    04/11/2023    7:38 AM 03/27/2023    3:36 PM 03/27/2023    1:13 PM  Last 3 Weights  Weight (lbs) 138 lb 7.2 oz 138 lb 3.2 oz 139 lb 6.4 oz  Weight (kg) 62.8 kg 62.687 kg 63.231 kg     Body mass index is 24.53 kg/m.  General:  Well nourished, well developed male appearing in no acute distress. HEENT: normal Neck: no JVD Vascular: No carotid bruits; Distal pulses 2+ bilaterally Cardiac:  normal S1, S2; RRR; no murmur Lungs:  clear to auscultation bilaterally, no wheezing, rhonchi or rales  Abd: soft, nontender, no hepatomegaly  Ext: 1+ pitting edema bilaterally.  Musculoskeletal:  No deformities, BUE and BLE strength normal and equal Skin: warm and dry  Neuro:  CNs 2-12 intact, no focal abnormalities noted Psych:  Normal affect   EKG:  The EKG was personally reviewed and demonstrates: NSR, HR 96 with LVH and ST depression along the lateral leads.  Telemetry:  Telemetry was personally reviewed and demonstrates:  NSR, HR in 80's with PVC's.   Relevant CV Studies:  LHC: 03/2022 CULPRIT LESION: Prox LAD lesion is 90% stenosed (ulcerated).  Mid LAD lesion is 80% stenosed.   A drug-eluting stent was successfully placed covering both lesions, using a STENT ONYX FRONTIER 2.75X30.  Stent was postdilated to 3.1 mm proximally tapered to 2.9 mm distally   Post intervention, there is a 0% residual stenosis of the stent.Marland Kitchen.   -------------------------------------------------   Ramus lesion is 80% stenosed. -Would be PCI amenable, but may not be causing  symptoms.  Would recommend medical management at this time.  If symptoms warrant, can consider staged PCI.   Small caliber (<2 mm ) RCA: Prox RCA to Mid RCA lesion is 90% stenosed. RPAV lesion is 100% stenosed (fills via left to right collaterals)   Mid LAD lesion is 80% stenosed.   -------------------------------------------------   LV end diastolic pressure is moderately elevated.   There is no aortic valve stenosis.   SUMMARY Severe Three-Vessel CAD: Very small caliber (however dominant) RCA with mid 90% stenosis followed by CTO (flush occlusion) of RPA V with 2 PL branches filling via collaterals from the LCx (PDA also has retrograde filling from septal perforator branches) Normal caliber LAD with ulcerated 90% proximal LAD (before SP1) followed by sequential 80% lesion just beyond SP1 with then mild diffuse disease distally.  (Likely culprit lesion) Successful DES PCI of proximal LAD covering both 90 and 80% lesions using Onyx Frontier DES 2.75 mm x 30 mm postdilated to 3.1 mm 80% Focal Ramus Intermedius stenosis (small-moderate caliber vessel, would consider medical therapy following LAD PCI, however if he were to have symptoms either tomorrow or in the outpatient setting would consider PCI) Relatively normal LCx Moderately elevated LVEDP   RECOMMENDATIONS Transferred to 6 E. cardiac telemetry unit for overnight monitoring post PCI.  Expect discharge tomorrow. For now would recommend medical therapy for the Ramus Intermediate lesion, as there is no evidence of ischemia in that distribution on Myoview.  However if he were to have concerning symptoms either prior to discharge or in the outpatient setting, would be a potential target for PCI. Continue aggressive Cardiovascular Risk Reduction with Guideline Directed Medical Therapy per primary team..   Echocardiogram: 03/2023 IMPRESSIONS     1. Left ventricular ejection fraction, by estimation, is 60 to 65%. The  left ventricle has normal  function. The left ventricle demonstrates  regional wall motion abnormalities, basal inferior hypokinesis. Left  ventricular diastolic parameters are  consistent with Grade I diastolic dysfunction (impaired relaxation).   2. Right ventricular systolic function is normal. The right ventricular  size is normal. There is normal pulmonary artery systolic pressure. The  estimated right ventricular systolic pressure is 20.6 mmHg.   3. Left atrial size was mild to moderately dilated.   4. The mitral valve is abnormal. Mild mitral valve regurgitation. No  evidence of mitral stenosis. Moderate mitral annular calcification.   5. The aortic valve is tricuspid. Aortic valve regurgitation is mild. No  aortic stenosis is present.   6. The inferior vena cava is normal in size with greater than 50%  respiratory variability, suggesting right atrial pressure of 3 mmHg.   Comparison(s): No significant change from prior study.   Laboratory Data:  High Sensitivity Troponin:   Recent Labs  Lab 04/11/23 0732 04/11/23 0933  TROPONINIHS 18* 47*     Chemistry Recent Labs  Lab 04/11/23 0732  NA 139  K 3.9  CL 106  CO2 23  GLUCOSE 140*  BUN 28*  CREATININE 1.63*  CALCIUM 8.8*  GFRNONAA 41*  ANIONGAP 10    Recent Labs  Lab 04/11/23 0732  PROT 5.9*  ALBUMIN 3.5  AST 25  ALT 20  ALKPHOS 189*  BILITOT 0.6   Lipids No results for input(s): "CHOL", "TRIG", "HDL", "LABVLDL", "LDLCALC", "CHOLHDL" in the last 168 hours.  Hematology Recent Labs  Lab 04/11/23 0732  WBC 6.1  RBC 1.89*  HGB 6.1*  HCT 19.5*  MCV 103.2*  MCH 32.3  MCHC 31.3  RDW  14.8  PLT 218   Thyroid No results for input(s): "TSH", "FREET4" in the last 168 hours.  BNPNo results for input(s): "BNP", "PROBNP" in the last 168 hours.  DDimer No results for input(s): "DDIMER" in the last 168 hours.   Radiology/Studies:  DG Chest 2 View  Result Date: 04/11/2023 CLINICAL DATA:  Chest pain EXAM: CHEST - 2 VIEW COMPARISON:   Previous studies including the examination of 04/17/2022 FINDINGS: Cardiac size is within normal limits. There are no signs of pulmonary edema or focal pulmonary consolidation. Increase in the AP diameter of chest suggests COPD. There is no pleural effusion or pneumothorax. IMPRESSION: There are no signs of pulmonary edema or focal pulmonary consolidation. Electronically Signed   By: Ernie Avena M.D.   On: 04/11/2023 07:59     Assessment and Plan:   1. Preoperative Cardiac Clearance for Endoscopy - He reports chest pain and dyspnea on exertion as outlined above which resemble his prior angina but likely secondary to his acute anemia in the setting of progressive symptoms over the past few weeks. Recent echocardiogram in 03/2023 showed a preserved EF of 60 to 65% with regional motion abnormalities noted and Grade 1 DD. - Given his acute need for further GI workup, would not anticipate further cardiac testing prior to his upcoming EGD. His RCRI risk is at 6.6% risk of major cardiac event. Of note, he does have evidence of volume overload by examination with 1+ pitting edema bilaterally. Given that he is also receiving blood, would recommend dosing IV Lasix following his transfusion.  2. CAD - He is s/p cath in 03/2022 with DES to LAD with residual ramus disease medically managed and noted to have an occluded RPAV with left to right collaterals. - Troponin values have been slightly elevated 18 and 47 which is likely secondary to demand ischemia in the setting of his anemia. Would recheck later today.  - ASA and Plavix are currently held given his anemia and active bleed.  He has been continued on Atorvastatin 80 mg daily.  3. HLD - FLP in 2023 showed LDL was at 48. Remains on Atorvastatin 80mg  daily.   4. Stage 3 CKD - Creatinine at 1.63 which is close to his known baseline.   5. Anemia - Hgb at 6.1 on admission and recent FOBT on 4/1/20214 was positive but the patient and his wife report  they were not aware of this result. Currently receiving 1 unit pRBC's. Awaiting GI evaluation.    For questions or updates, please contact [facility] Please consult [email or web address] for contact info under    Signed, Ellsworth Lennox, PA-C  04/11/2023 10:42 AM  Attending note Patient seen and discussed with PA Iran Ouch, I agree with her documentation. 89 yo male history of CAD with DES to LAD 03/2022 with residual disease managed medically, HLD, DM2, prior CVA on plavix monotherapy now, DM presents with chest pain, SOB, indigestion like pain. In ED found to have Hgb 6.1, cardiology consulted for evaluation for clearance for GI endoscopy.     K 3.9 Cr 1.63 BUN 28 WBC 6.1 Hgb 6.1 Ferritin 16  Trop 18-->47 EKG SR, isoalted ST depression V5 CXR no acute process   03/2023 echo: LVEF 60-65%, grade I dd  04/20/22 cath: LM mild disease, LAD prox 90% and mid 80%, ramus 80%, LCX luminal irreg, RCA small prox to mid 90%, RPAV occluded with left to right collaterals - DES to LAD. Ramus disease managed medically to limit contrast but could  consider intervention pending symptoms.   Assessment/Plan  1.CAD/Chest pain - do not suspect ACS, likely mild trop elevation due to chronic residual disease in setting of severe anemia - no plans for ischemic testing at this time, follow symptoms with management of his anemia - ok to hold antiplatelts as needed. Plan was to stop asa and go back to his plavix monotherapy he had been on for his CVA prior to his stenting last year. Can go ahead and d/c aspirin, resume plavix alone when able  2.Acute on chronic HFpEF - 03/2023 echo: LVEF 60-65%, grade I dd - some signs of fluid overload. Inconsistent historically with his home diuretic due to frequent urination - dose IV lasix 40mg  x 1 today and follow   3. Anemia - per primary team  4. Preoperative evaluation - recommend proceeding with endoscopy as needed, no cardiac contraincications at this  time  Dina Rich MD

## 2023-04-11 NOTE — Assessment & Plan Note (Signed)
-  Currently stable and at baseline -Patient renal failure associated with history of diabetes -Minimize nephrotoxic agents and avoid hypotension -Continue to follow renal function trend.

## 2023-04-11 NOTE — ED Notes (Signed)
Report called to Brandi RN

## 2023-04-11 NOTE — Assessment & Plan Note (Addendum)
Continue statin 

## 2023-04-11 NOTE — H&P (Signed)
History and Physical    Patient: Martin Frazier. WYB:[id number] DOB: 06-Nov-1934 DOA: 04/11/2023 DOS: the patient was seen and examined on 04/11/2023 PCP: [facility]  Patient coming from: Home  Chief Complaint:  Chief Complaint  Patient presents with   Chest Pain   HPI: Martin Frazier. is a 89 y.o. 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  Patient's symptoms initiated in midepigastric area with radiation to mid chest and both arms, patient to Tums without relief and then use his nitroglycerin which seemed to help improving discomfort.  Presented to the hospital for further evaluation and management. At time of my examination patient reports no active chest pain.  Expressed feeling weak and tired especially with associated dyspnea on exertion.  Recent visit to the gastroenterology service has demonstrated positive fecal occult blood test and blood work in the ED demonstrating hemoglobin down to 6.1.  Patient was pale on examination and chronically ill in appearance.  Patient reports ongoing intermittent mid epigastric discomfort and reflux symptoms that have been going for years and also lately has noticed some dysphagia.  Patient reports to be compliant with medications.  Gastroenterology service and cardiology service has been consulted and [facility] contacted to place patient in the hospital for further evaluation and management.  2 large IV ports and 1 unit of blood has been initiated to be transfused along with IV PPI.   Review of Systems: As mentioned in the history of present illness. All other systems reviewed and are negative. Past Medical  History:  Diagnosis Date   CAD (coronary artery disease)    Collapsed lung    left   Diabetes mellitus without complication    over 10 yrs   GERD (gastroesophageal reflux disease)    History of CVA (cerebrovascular accident)    Hypertension    Kidney stones    Past Surgical History:  Procedure Laterality Date   APPENDECTOMY     BIOPSY  04/01/2019   Procedure: BIOPSY;  Surgeon: Malissa Hippo, MD;  Location: [facility];  Service: Endoscopy;;  gastric   CORONARY STENT INTERVENTION N/A 04/20/2022   Procedure: CORONARY STENT INTERVENTION;  Surgeon: Marykay Lex, MD;  Location: [facility] INVASIVE CV LAB;  Service: Cardiovascular;  Laterality: N/A;   CYSTOSCOPY W/ URETERAL STENT PLACEMENT Right 04/03/2019   Procedure: CYSTOSCOPY WITH RETROGRADE PYELOGRAM/URETERAL STENT PLACEMENT;  Surgeon: Marcine Matar, MD;  Location: [facility] ORS;  Service: Urology;  Laterality: Right;   ESOPHAGOGASTRODUODENOSCOPY N/A 04/01/2019   Procedure: ESOPHAGOGASTRODUODENOSCOPY (EGD);  Surgeon: Malissa Hippo, MD;  Location: [facility];  Service: Endoscopy;  Laterality: N/A;  10:55am   EXTRACORPOREAL SHOCK WAVE LITHOTRIPSY Right 04/16/2019   Procedure: EXTRACORPOREAL SHOCK WAVE LITHOTRIPSY (ESWL);  Surgeon: Jerilee Field, MD;  Location: [facility] ORS;  Service: Urology;  Laterality: Right;   HEMORRHOID SURGERY     KIDNEY STONE SURGERY     LEFT HEART CATH AND CORONARY ANGIOGRAPHY N/A 04/20/2022   Procedure: LEFT HEART CATH AND CORONARY ANGIOGRAPHY;  Surgeon: Marykay Lex, MD;  Location: [facility] INVASIVE CV LAB;  Service: Cardiovascular;  Laterality: N/A;   TONSILLECTOMY     Social History:  reports that he has quit smoking. His smoking use included  cigarettes. He has never used smokeless tobacco. He reports that he does not drink alcohol and does not use drugs.  Allergies  Allergen Reactions   Sulfa Antibiotics Nausea And Vomiting    Family History  Problem Relation Age of Onset   Heart attack Father     Prior  to Admission medications   Medication Sig Start Date End Date Taking? Authorizing Provider  acetaminophen (TYLENOL) 325 MG tablet Take 2 tablets (650 mg total) by mouth every 4 (four) hours as needed for mild pain (or temp > 37.5 C (99.5 F)). 04/05/20  Yes Angiulli, Mcarthur Rossetti, PA-C  aspirin EC 81 MG EC tablet Take 1 tablet (81 mg total) by mouth daily. Swallow whole. 04/22/22  Yes Burnadette Pop, MD  atorvastatin (LIPITOR) 80 MG tablet Take 1 tablet (80 mg total) by mouth daily. 05/09/22  Yes BranchDorothe Pea, MD  Cholecalciferol (VITAMIN D) 50 MCG (2000 UT) CAPS Take 1 capsule (2,000 Units total) by mouth daily. 04/05/20  Yes Angiulli, Mcarthur Rossetti, PA-C  clopidogrel (PLAVIX) 75 MG tablet Take 1 tablet (75 mg total) by mouth daily. 04/21/22  Yes Burnadette Pop, MD  cyanocobalamin (VITAMIN B12) 500 MCG tablet Take 500 mcg by mouth daily.   Yes [provider]  ferrous sulfate 325 (65 FE) MG EC tablet Take 325 mg by mouth daily with breakfast.   Yes [provider]  furosemide (LASIX) 20 MG tablet Take 1 tablet (20 mg total) by mouth every other day. Patient taking differently: Take 20 mg by mouth daily. 03/04/23 02/27/24 Yes Branch, Dorothe Pea, MD  glimepiride (AMARYL) 1 MG tablet Take 1 tablet (1 mg total) by mouth daily with breakfast. 04/06/20  Yes Angiulli, Mcarthur Rossetti, PA-C  latanoprost (XALATAN) 0.005 % ophthalmic solution Place 1 drop into both eyes at bedtime. 04/05/20  Yes Angiulli, Mcarthur Rossetti, PA-C  Multiple Vitamins-Minerals (ONE-A-DAY 50 PLUS PO) Take by mouth.   Yes [provider]  nitroGLYCERIN (NITROSTAT) 0.4 MG SL tablet Place 1 tablet (0.4 mg total) under the tongue every 5 (five) minutes as needed for chest pain. 03/27/23 03/26/24 Yes BranchDorothe Pea, MD  pantoprazole (PROTONIX) 40 MG tablet Take 40 mg by mouth daily.   Yes [provider]  tamsulosin (FLOMAX) 0.4 MG CAPS capsule Take 1 capsule (0.4 mg total) by mouth daily. 04/05/20  Yes Angiulli, Mcarthur Rossetti, PA-C   vitamin B-12 (CYANOCOBALAMIN) 1000 MCG tablet Take 1 tablet (1,000 mcg total) by mouth daily. 04/05/20  Yes Charlton Amor, PA-C    Physical Exam: Vitals:   04/11/23 0929 04/11/23 0934 04/11/23 0935 04/11/23 0945  BP:  (!) 140/64 (!) 140/64 137/70  Pulse: 94  94 85  Resp: 17  16 15   Temp: 97.8 F (36.6 C)     TempSrc: Oral     SpO2: 98%  99% 98%  Weight:      Height:       General exam: Alert, awake, oriented x 3, pale, chronically ill in appearance and expressing to be weak and tired.  At time of evaluation denying chest pain. Respiratory system: Good saturation on room air; no using accessory muscles. Cardiovascular system: Rate controlled, no rubs, no gallops, no JVD on exam. Gastrointestinal system: Abdomen is nondistended, soft and vaguely tender in midepigastric area. No organomegaly or masses felt. Normal bowel sounds heard. Central nervous system: No focal neurological deficits. Extremities: No cyanosis or clubbing; trace to 1+ edema appreciated bilaterally. Skin: No petechiae. Psychiatry: Judgement and insight appear normal.  Mood & affect appropriate.   Data Reviewed: Comprehensive metabolic panel: Sodium 139, potassium 3.9, chloride 106, bicarb 23, BUN 28, creatinine 1.63, normal LFTs, alkaline phosphatase 189 and GFR 41. CBC: White blood cells 6.1, hemoglobin 6.1 platelet count 218 K; MCV 103.2 Troponin: Flat elevation at 18>>27>>47 Ferritin 16 Iron and TIBC demonstrating iron level of 18 with saturation ration of 5.  Assessment and Plan: * Symptomatic anemia -Patient with underlying history of chronic anemia secondary to chronic renal failure; also with concerns for upper GI bleed.  -Positive fecal occult blood test, hemoglobin down to 6.1 and dark stools reported (conflicting information with dark stools a patient is chronically using iron supplementation). -GI service has been consulted for endoscopic evaluation -Patient will be transfused to stabilize  hemoglobin  -Provide supportive care and follow clinical response. -Continue twice a day IV PPI.  Chronic kidney disease, stage 3b -Currently stable and at baseline -Patient renal failure associated with history of diabetes -Minimize nephrotoxic agents and avoid hypotension -Continue to follow renal function trend.  Chronic diastolic HF (heart failure) -To be compensated -Low-sodium diet discussed with patient -Continue to follow daily weights and strict intake and output -Temporarily holding diuretics in the setting of acute GI bleed.  HLD (hyperlipidemia) -Continue statin.  GERD (gastroesophageal reflux disease) -PPI twice a day has been initiated with concern for acute GI bleed.  Essential hypertension, benign -Stable overall -Continue to follow vital sign -Continue home antihypertensive agents.  Diabetes (HCC) -Holding oral hypoglycemic agents while inpatient -A1c 6.3 recently -Sliding scale insulin will be provided while inpatient -Follow CBGs fluctuation.  Constipation -Most likely associated with the use of iron supplementation -Planning to start bowel regimen using Colace and MiraLAX at discharge.  History of coronary artery disease -Continue statin -Holding aspirin and Plavix at the moment -Patient chest discomfort most likely associated with ongoing anemia and mild elevation troponin in the setting of demand ischemia. -Cardiology service has been consulted and will follow further recommendation.    Advance Care Planning:   Code Status: Full Code   Consults: Cardiology and gastroenterology service  Family Communication: No family at bedside.  Severity of Illness: The appropriate patient status for this patient is INPATIENT. Inpatient status is judged to be reasonable and necessary in order to provide the required intensity of service to ensure the patient's safety. The patient's presenting symptoms, physical exam findings, and initial radiographic and  laboratory data in the context of their chronic comorbidities is felt to place them at high risk for further clinical deterioration. Furthermore, it is not anticipated that the patient will be medically stable for discharge from the hospital within 2 midnights of admission.   * I certify that at the point of admission it is my clinical judgment that the patient will require inpatient hospital care spanning beyond 2 midnights from the point of admission due to high intensity of service, high risk for further deterioration and high frequency of surveillance required.*  Author: Vassie Lollarlos Suhail Peloquin, MD 04/11/2023 10:41 AM  For on call review [email or web address].

## 2023-04-11 NOTE — Telephone Encounter (Signed)
Patient now inpatient. Labs have been updated.

## 2023-04-11 NOTE — Telephone Encounter (Signed)
Great efforts Martin Frazier. I think we have enough with current inpatient labs.

## 2023-04-11 NOTE — Assessment & Plan Note (Addendum)
-  Stable overall -Continue home antihypertensive agents. -Heart healthy/low-sodium diet discussed with patient.

## 2023-04-11 NOTE — Assessment & Plan Note (Addendum)
-  Most likely associated with the use of iron supplementation -Planning to start bowel regimen using Colace and MiraLAX at discharge. -Follow response as an outpatient and further adjust regimen as needed.

## 2023-04-11 NOTE — Assessment & Plan Note (Addendum)
-  Please use to be compensated for the most part. -Continue to follow daily weights and strict I's and O's -Lasix x 1 dose given yesterday around transfusion to minimize chances of fluid follow-up. -Good saturation on room air, no complaints of orthopnea during today evaluation.

## 2023-04-12 ENCOUNTER — Inpatient Hospital Stay (HOSPITAL_COMMUNITY): Payer: Medicare Other | Admitting: Anesthesiology

## 2023-04-12 ENCOUNTER — Encounter (HOSPITAL_COMMUNITY)
Admission: EM | Disposition: A | Discharge status: Home / Self Care | Payer: Self-pay | Source: Home / Self Care | Attending: Internal Medicine

## 2023-04-12 ENCOUNTER — Encounter (HOSPITAL_COMMUNITY): Payer: Self-pay | Admitting: Internal Medicine

## 2023-04-12 ENCOUNTER — Telehealth: Payer: Self-pay | Admitting: Gastroenterology

## 2023-04-12 DIAGNOSIS — Z87891 Personal history of nicotine dependence: Secondary | ICD-10-CM

## 2023-04-12 DIAGNOSIS — R1013 Epigastric pain: Secondary | ICD-10-CM

## 2023-04-12 DIAGNOSIS — K648 Other hemorrhoids: Secondary | ICD-10-CM | POA: Diagnosis not present

## 2023-04-12 DIAGNOSIS — I2510 Atherosclerotic heart disease of native coronary artery without angina pectoris: Secondary | ICD-10-CM

## 2023-04-12 DIAGNOSIS — K5520 Angiodysplasia of colon without hemorrhage: Secondary | ICD-10-CM

## 2023-04-12 DIAGNOSIS — D509 Iron deficiency anemia, unspecified: Secondary | ICD-10-CM | POA: Diagnosis not present

## 2023-04-12 DIAGNOSIS — E1310 Other specified diabetes mellitus with ketoacidosis without coma: Secondary | ICD-10-CM

## 2023-04-12 DIAGNOSIS — I5033 Acute on chronic diastolic (congestive) heart failure: Secondary | ICD-10-CM | POA: Diagnosis not present

## 2023-04-12 DIAGNOSIS — I110 Hypertensive heart disease with heart failure: Secondary | ICD-10-CM

## 2023-04-12 HISTORY — PX: ESOPHAGEAL DILATION: SHX303

## 2023-04-12 HISTORY — PX: ESOPHAGOGASTRODUODENOSCOPY (EGD) WITH PROPOFOL: SHX5813

## 2023-04-12 HISTORY — PX: HOT HEMOSTASIS: SHX5433

## 2023-04-12 HISTORY — PX: COLONOSCOPY WITH PROPOFOL: SHX5780

## 2023-04-12 LAB — TYPE AND SCREEN
ABO/RH(D): A POS
Antibody Screen: NEGATIVE
Unit division: 0

## 2023-04-12 LAB — BRAIN NATRIURETIC PEPTIDE: B Natriuretic Peptide: 397 pg/mL — ABNORMAL HIGH (ref 0.0–100.0)

## 2023-04-12 LAB — BASIC METABOLIC PANEL
Anion gap: 6 (ref 5–15)
BUN: 22 mg/dL (ref 8–23)
CO2: 26 mmol/L (ref 22–32)
Calcium: 8.4 mg/dL — ABNORMAL LOW (ref 8.9–10.3)
Chloride: 103 mmol/L (ref 98–111)
Creatinine, Ser: 1.53 mg/dL — ABNORMAL HIGH (ref 0.61–1.24)
GFR, Estimated: 44 mL/min — ABNORMAL LOW (ref >60)
Glucose, Bld: 115 mg/dL — ABNORMAL HIGH (ref 70–99)
Potassium: 3.4 mmol/L — ABNORMAL LOW (ref 3.5–5.1)
Sodium: 135 mmol/L (ref 135–145)

## 2023-04-12 LAB — CBC
HCT: 27 % — ABNORMAL LOW (ref 39.0–52.0)
HCT: 27.3 % — ABNORMAL LOW (ref 39.0–52.0)
Hemoglobin: 8.7 g/dL — ABNORMAL LOW (ref 13.0–17.0)
Hemoglobin: 8.8 g/dL — ABNORMAL LOW (ref 13.0–17.0)
MCH: 30.6 pg (ref 26.0–34.0)
MCH: 30.8 pg (ref 26.0–34.0)
MCHC: 32.2 g/dL (ref 30.0–36.0)
MCHC: 32.2 g/dL (ref 30.0–36.0)
MCV: 95.1 fL (ref 80.0–100.0)
MCV: 95.5 fL (ref 80.0–100.0)
Platelets: 201 10*3/uL (ref 150–400)
Platelets: 210 10*3/uL (ref 150–400)
RBC: 2.84 MIL/uL — ABNORMAL LOW (ref 4.22–5.81)
RBC: 2.86 MIL/uL — ABNORMAL LOW (ref 4.22–5.81)
RDW: 16.8 % — ABNORMAL HIGH (ref 11.5–15.5)
RDW: 16.8 % — ABNORMAL HIGH (ref 11.5–15.5)
WBC: 7 10*3/uL (ref 4.0–10.5)
WBC: 6.1 10*3/uL (ref 4.0–10.5)
nRBC: 0 % (ref 0.0–0.2)
nRBC: 0 % (ref 0.0–0.2)

## 2023-04-12 LAB — BPAM RBC
Blood Product Expiration Date: 202405112359
Blood Product Expiration Date: 202405122359
ISSUE DATE / TIME: 202404110922
ISSUE DATE / TIME: 202404111843
Unit Type and Rh: 6200
Unit Type and Rh: 6200

## 2023-04-12 LAB — GLUCOSE, CAPILLARY
Glucose-Capillary: 116 mg/dL — ABNORMAL HIGH (ref 70–99)
Glucose-Capillary: 118 mg/dL — ABNORMAL HIGH (ref 70–99)
Glucose-Capillary: 128 mg/dL — ABNORMAL HIGH (ref 70–99)
Glucose-Capillary: 148 mg/dL — ABNORMAL HIGH (ref 70–99)
Glucose-Capillary: 223 mg/dL — ABNORMAL HIGH (ref 70–99)
Glucose-Capillary: 143 mg/dL — ABNORMAL HIGH (ref 70–99)

## 2023-04-12 LAB — MAGNESIUM: Magnesium: 2 mg/dL (ref 1.7–2.4)

## 2023-04-12 SURGERY — COLONOSCOPY WITH PROPOFOL
Anesthesia: General

## 2023-04-12 MED ORDER — STERILE WATER FOR IRRIGATION IR SOLN
Status: DC | PRN
  Administered 2023-04-12: 50 mL

## 2023-04-12 MED ORDER — FUROSEMIDE 10 MG/ML IJ SOLN
60.0000 mg | Freq: Once | INTRAMUSCULAR | Status: AC
  Administered 2023-04-12: 60 mg via INTRAVENOUS
  Filled 2023-04-12: qty 6

## 2023-04-12 MED ORDER — SODIUM CHLORIDE 0.9 % IV SOLN: INTRAVENOUS | Status: DC

## 2023-04-12 MED ORDER — PROPOFOL 10 MG/ML IV BOLUS
INTRAVENOUS | Status: DC | PRN
  Administered 2023-04-12: 70 mg via INTRAVENOUS

## 2023-04-12 MED ORDER — LACTATED RINGERS IV SOLN: INTRAVENOUS | Status: DC

## 2023-04-12 MED ORDER — LACTATED RINGERS IV SOLN: INTRAVENOUS | Status: DC | PRN

## 2023-04-12 MED ORDER — POTASSIUM CHLORIDE CRYS ER 20 MEQ PO TBCR
40.0000 meq | EXTENDED_RELEASE_TABLET | Freq: Once | ORAL | Status: AC
  Administered 2023-04-12: 40 meq via ORAL
  Filled 2023-04-12: qty 2

## 2023-04-12 MED ORDER — PROPOFOL 500 MG/50ML IV EMUL
INTRAVENOUS | Status: DC | PRN
  Administered 2023-04-12: 100 ug/kg/min via INTRAVENOUS

## 2023-04-12 NOTE — Progress Notes (Signed)
Rounding Note    Patient Name: Martin Frazier. Date of Encounter: 04/12/2023  [facility] Cardiologist: Dina Rich, MD   Subjective   No complaints  Inpatient Medications    Scheduled Meds:  sodium chloride   Intravenous Once   sodium chloride   Intravenous Once   atorvastatin  80 mg Oral Daily   cyanocobalamin  1,000 mcg Oral Daily   insulin aspart  0-5 Units Subcutaneous QHS   insulin aspart  0-6 Units Subcutaneous TID WC   pantoprazole (PROTONIX) IV  40 mg Intravenous Q12H   sodium chloride flush  3 mL Intravenous Q12H   tamsulosin  0.4 mg Oral Daily   Continuous Infusions:  sodium chloride Stopped (04/11/23 1543)   PRN Meds: sodium chloride, acetaminophen **OR** acetaminophen, ondansetron **OR** ondansetron (ZOFRAN) IV, sodium chloride flush   Vital Signs    Vitals:   04/11/23 1851 04/11/23 1907 04/11/23 2223 04/12/23 0559  BP: 121/66 120/64 134/72 (!) 147/56  Pulse: 83 79 82 78  Resp: 15  20 18   Temp: 98.3 F (36.8 C) 98.2 F (36.8 C) 97.8 F (36.6 C) 97.6 F (36.4 C)  TempSrc: Oral Oral Oral Oral  SpO2:   98% 98%  Weight:    58.3 kg  Height:        Intake/Output Summary (Last 24 hours) at 04/12/2023 1014 Last data filed at 04/12/2023 7654 Gross per 24 hour  Intake 3667.55 ml  Output 2850 ml  Net 817.55 ml      04/12/2023    5:59 AM 04/11/2023    2:00 PM 04/11/2023    7:38 AM  Last 3 Weights  Weight (lbs) 128 lb 8.5 oz 133 lb 9.6 oz 138 lb 7.2 oz  Weight (kg) 58.3 kg 60.601 kg 62.8 kg      Telemetry    SR - Personally Reviewed  ECG    na - Personally Reviewed  Physical Exam   GEN: No acute distress.   Neck: No JVD Cardiac: RRR, no murmurs, rubs, or gallops.  Respiratory: Clear to auscultation bilaterally. GI: Soft, nontender, non-distended  MS: 1+ bilateral LE edema Neuro:  Nonfocal  Psych: Normal affect   Labs    High Sensitivity Troponin:   Recent Labs  Lab 04/11/23 0732 04/11/23 0933  TROPONINIHS 18*  47*     Chemistry Recent Labs  Lab 04/11/23 0732 04/12/23 0459  NA 139 135  K 3.9 3.4*  CL 106 103  CO2 23 26  GLUCOSE 140* 115*  BUN 28* 22  CREATININE 1.63* 1.53*  CALCIUM 8.8* 8.4*  MG  --  2.0  PROT 5.9*  --   ALBUMIN 3.5  --   AST 25  --   ALT 20  --   ALKPHOS 189*  --   BILITOT 0.6  --   GFRNONAA 41* 44*  ANIONGAP 10 6    Lipids No results for input(s): "CHOL", "TRIG", "HDL", "LABVLDL", "LDLCALC", "CHOLHDL" in the last 168 hours.  Hematology Recent Labs  Lab 04/11/23 1527 04/12/23 0152 04/12/23 0459  WBC 4.9 7.0 6.1  RBC 2.18* 2.84* 2.86*  HGB 6.8* 8.7* 8.8*  HCT 21.7* 27.0* 27.3*  MCV 99.5 95.1 95.5  MCH 31.2 30.6 30.8  MCHC 31.3 32.2 32.2  RDW 16.5* 16.8* 16.8*  PLT 190 201 210   Thyroid No results for input(s): "TSH", "FREET4" in the last 168 hours.  BNP Recent Labs  Lab 04/12/23 0459  BNP 397.0*    DDimer No results  for input(s): "DDIMER" in the last 168 hours.   Radiology    DG Chest 2 View  Result Date: 04/11/2023 CLINICAL DATA:  Chest pain EXAM: CHEST - 2 VIEW COMPARISON:  Previous studies including the examination of 04/17/2022 FINDINGS: Cardiac size is within normal limits. There are no signs of pulmonary edema or focal pulmonary consolidation. Increase in the AP diameter of chest suggests COPD. There is no pleural effusion or pneumothorax. IMPRESSION: There are no signs of pulmonary edema or focal pulmonary consolidation. Electronically Signed   By: Ernie Avena M.D.   On: 04/11/2023 07:59    Cardiac Studies     Patient Profile     Martin Merriam. is a 89 y.o. male with a hx of CAD (s/p cath in 03/2022 with DES to LAD, residual ramus disease medically managed and noted to have an occluded RPAV with left to right collaterals), HLD, Type 2 DM, GERD, Stage 3 CKD, prior tobacco use and prior CVA (right thalamic infarction in 02/2020) who is being seen 04/11/2023 for the evaluation of preoperative cardiac clearance for endoscopy at the  request of Dr. Gwenlyn Perking.   Assessment & Plan    1.CAD/Chest pain - do not suspect ACS, likely mild trop elevation due to chronic residual disease in setting of severe anemia - no plans for ischemic testing at this time, follow symptoms with management of his anemia - ok to hold antiplatelts as needed. Plan was to stop asa and go back to his plavix monotherapy that he had been on for his CVA prior to his stenting last year. Can go ahead and d/c aspirin, resume plavix alone when able   2.Acute on chronic HFpEF - 03/2023 echo: LVEF 60-65%, grade I dd - some signs of fluid overload. Inconsistent historically with his home diuretic due to frequent urination  - +888mL yesterday, received IV lasix 40mg  x 1. Downtrend in Cr.  IV lasix - some ongoing edema, significant input yesterday with transfuisions, dose IV lasix 60mg  x 1 today   3. Anemia - per primary team   4. Preoperative evaluation - recommend proceeding with endoscopy as needed, no cardiac contraincications at this time  For questions or updates, please contact [facility] Please consult [email or web address] for contact info under        Signed, Dina Rich, MD  04/12/2023, 10:14 AM

## 2023-04-12 NOTE — Telephone Encounter (Signed)
Please arrange hospital follow up with [facility] in 3-4 weeks.   Brooke Bonito, MSN, APRN, FNP-BC, AGACNP-BC [facility] at [facility]

## 2023-04-12 NOTE — Op Note (Signed)
[facility] Patient Name: Martin Frazier Procedure Date: 04/12/2023 1:57 PM MRN: [id number] Date of Birth: Nov 18, 1934 Attending MD: Katrinka Blazing , , [id number] CSN: [id number] Age: 89 Admit Type: Outpatient Procedure:                Upper GI endoscopy Indications:              Iron deficiency anemia, Dysphagia Providers:                Katrinka Blazing, Roma Schanz RN, RN, Dyann Ruddle Referring MD:              Medicines:                Monitored Anesthesia Care Complications:            No immediate complications. Estimated Blood Loss:     Estimated blood loss: none. Procedure:                Pre-Anesthesia Assessment:                           - Prior to the procedure, a History and Physical                            was performed, and patient medications, allergies                            and sensitivities were reviewed. The patient's                            tolerance of previous anesthesia was reviewed.                           - The risks and benefits of the procedure and the                            sedation options and risks were discussed with the                            patient. All questions were answered and informed                            consent was obtained.                           - ASA Grade Assessment: III - A patient with severe                            systemic disease.                           After obtaining informed consent, the endoscope was                            passed under direct vision. Throughout the  procedure, the patient's blood pressure, pulse, and                            oxygen saturations were monitored continuously. The                            [id number]-[id number] ([id number]) scope was introduced through the                            mouth, and advanced to the second part of duodenum.                            The upper GI endoscopy was accomplished without                             difficulty. The patient tolerated the procedure                            well. Scope In: 2:10:52 PM Scope Out: 2:15:11 PM Total Procedure Duration: 0 hours 4 minutes 19 seconds  Findings:      No endoscopic abnormality was evident in the esophagus to explain the       patient's complaint of dysphagia. It was decided, however, to proceed       with dilation of the entire esophagus. A guidewire was placed and the       scope was withdrawn. Dilation was performed with a Savary dilator with       no resistance at 18 mm. The dilation site was examined following       endoscope reinsertion and showed no change.      The stomach was normal.      The examined duodenum was normal. Impression:               - No endoscopic esophageal abnormality to explain                            patient's dysphagia. Esophagus dilated.                           - Normal stomach.                           - Normal examined duodenum.                           - No specimens collected. Moderate Sedation:      Per Anesthesia Care Recommendation:           Proceed with colonoscopy Procedure Code(s):        --- Professional ---                           [phone], Esophagogastroduodenoscopy, flexible,                            transoral; with insertion of guide wire followed by  passage of dilator(s) through esophagus over guide                            wire Diagnosis Code(s):        --- Professional ---                           R13.10, Dysphagia, unspecified                           D50.9, Iron deficiency anemia, unspecified CPT copyright 2022 American Medical Association. All rights reserved. The codes documented in this report are preliminary and upon coder review may  be revised to meet current compliance requirements. Katrinka Blazing, MD Katrinka Blazing,  04/12/2023 2:39:13 PM This report has been signed electronically. Number of Addenda: 0

## 2023-04-12 NOTE — Anesthesia Preprocedure Evaluation (Signed)
Anesthesia Evaluation  Patient identified by MRN, date of birth, ID band Patient awake    Reviewed: Allergy & Precautions, H&P , NPO status , Patient's Chart, lab work & pertinent test results, reviewed documented beta blocker date and time   Airway Mallampati: II  TM Distance: >3 FB Neck ROM: full    Dental no notable dental hx.    Pulmonary neg pulmonary ROS, former smoker   Pulmonary exam normal breath sounds clear to auscultation       Cardiovascular Exercise Tolerance: Good hypertension, + CAD and +CHF  negative cardio ROS  Rhythm:regular Rate:Normal     Neuro/Psych CVA negative neurological ROS  negative psych ROS   GI/Hepatic negative GI ROS, Neg liver ROS,GERD  ,,  Endo/Other  negative endocrine ROSdiabetes    Renal/GU Renal diseasenegative Renal ROS  negative genitourinary   Musculoskeletal   Abdominal   Peds  Hematology negative hematology ROS (+) Blood dyscrasia, anemia   Anesthesia Other Findings   Reproductive/Obstetrics negative OB ROS                             Anesthesia Physical Anesthesia Plan  ASA: 3  Anesthesia Plan: General   Post-op Pain Management:    Induction:   PONV Risk Score and Plan: Propofol infusion  Airway Management Planned:   Additional Equipment:   Intra-op Plan:   Post-operative Plan:   Informed Consent: I have reviewed the patients History and Physical, chart, labs and discussed the procedure including the risks, benefits and alternatives for the proposed anesthesia with the patient or authorized representative who has indicated his/her understanding and acceptance.     Dental Advisory Given  Plan Discussed with: CRNA  Anesthesia Plan Comments:        Anesthesia Quick Evaluation

## 2023-04-12 NOTE — Progress Notes (Signed)
Progress Note   Patient: Martin Frazier. BZM:[id number] DOB: 07-14-1934 DOA: 04/11/2023     1 DOS: the patient was seen and examined on 04/12/2023   Brief hospital course:  Martin Frazier. is a 89 y.o. 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  Patient's symptoms initiated in midepigastric area with radiation to mid chest and both arms, patient to Tums without relief and then use his nitroglycerin which seemed to help improving discomfort.  Presented to the hospital for further evaluation and management. At time of my examination patient reports no active chest pain.  Expressed feeling weak and tired especially with associated dyspnea on exertion.   Recent visit to the gastroenterology service has demonstrated positive fecal occult blood test and blood work in the ED demonstrating hemoglobin down to 6.1.  Patient was pale on examination and chronically ill in appearance.   Patient reports ongoing intermittent mid epigastric discomfort and reflux symptoms that have been going for years and also lately has noticed some dysphagia.  Patient reports to be compliant with medications.   Gastroenterology service and cardiology service has been consulted and [facility] contacted to place patient in the hospital for further evaluation and management.  2 large IV ports and 1 unit of blood has been initiated to be transfused along with IV PPI.  Assessment and Plan: * Symptomatic anemia -Patient with underlying history of chronic anemia secondary to chronic renal failure; also with concerns for upper GI bleed.  -Positive fecal occult blood test, hemoglobin down to 6.1 and dark stools  reported (conflicting information with dark stools a patient is chronically using iron supplementation). -No overt bleeding appreciated -Planning for endoscopic evaluation (EGD and colonoscopy later today) -Continue to follow hemoglobin trend and further profuse as needed.   -Continue twice a day IV PPI.  Chronic kidney disease, stage 3b -Currently stable and at baseline -Patient renal failure associated with history of diabetes -Minimize nephrotoxic agents and avoid hypotension -Continue to follow renal function trend.  Chronic diastolic HF (heart failure) -Please use to be compensated for the most part. -Continue to follow daily weights and strict I's and O's -Lasix x 1 dose given yesterday around transfusion to minimize chances of fluid follow-up. -Good saturation on room air, no complaints of orthopnea during today evaluation.   HLD (hyperlipidemia) -Continue statin.  GERD (gastroesophageal reflux disease) -Continue PPI, currently twice a day.  Essential hypertension, benign -Stable overall -Continue to follow vital sign -Continue home antihypertensive agents.  Diabetes (HCC) -Continue holding oral hypoglycemic agents while inpatient -A1c 6.3 recently -Sliding scale insulin will be provided while inpatient -Continue to follow CBGs fluctuation.  Constipation -Most likely associated with the use of iron supplementation -Planning to start bowel regimen using Colace and MiraLAX at discharge. -Blood bowel movements has been achieved with bowel prep for anticipated colonoscopy evaluation.  Hypokalemia: -Associated with the use of diuretics and bowel preparation -Replete electrolytes and follow trend.  Subjective:  No overt bleeding appreciated; good response to blood transfusion with hemoglobin up to 8.8 currently.  Patient denies chest pain or shortness of breath.  No fever.  Physical Exam: Vitals:   04/12/23 1500 04/12/23 1515 04/12/23 1530 04/12/23 1543  BP: (!)  117/59 109/78 136/68 Marland Kitchen)  136/55  Pulse: 79 84 86 86  Resp: 10 14 20 18   Temp:      TempSrc:      SpO2: 100% 100% 92% 100%  Weight:      Height:       General exam: Alert, awake, oriented x , no overt bleeding appreciated; patient hard of hearing.  So far tolerating well bowel prep.  Denies chest pain or shortness of breath. Respiratory system: Clear to auscultation. Respiratory effort normal.  No using accessory muscles. Cardiovascular system:RRR. No rubs or gallops; no JVD on exam. Gastrointestinal system: Abdomen is soft, nontender, nondistended, positive bowel sounds. Central nervous system: Alert and oriented. No focal neurological deficits. Extremities: No cyanosis or clubbing. Skin: No petechiae. Psychiatry: Mood & affect appropriate.    Data Reviewed: CBC: WBC 6.1, hemoglobin 8.8 and platelet count 210 K BNP 397 Basic metabolic panel: Sodium 135, potassium 3.4, chloride 103, bicarb 26, BUN 22, creatinine 1.53 and GFR 44  Family Communication: Wife at bedside.  Disposition: Status is: Inpatient Remains inpatient appropriate because: Follow endoscopy results, continue to follow hemoglobin trend, continue IV PPI.   Planned Discharge Destination: Home   Time spent: 35 minutes  Author: Vassie Loll, MD 04/12/2023 4:55 PM  For on call review [email or web address].

## 2023-04-12 NOTE — Progress Notes (Signed)
We will proceed with EGD and colonoscopy as scheduled.  I thoroughly discussed with the patient the procedure, including the risks involved. Patient understands what the procedure involves including the benefits and any risks. Patient understands alternatives to the proposed procedure. Risks including (but not limited to) bleeding, tearing of the lining (perforation), rupture of adjacent organs, problems with heart and lung function, infection, and medication reactions. A small percentage of complications may require surgery, hospitalization, repeat endoscopic procedure, and/or transfusion.  Patient understood and agreed.  Martin Weyers Castaneda, MD Gastroenterology and Hepatology [facility]  

## 2023-04-12 NOTE — Progress Notes (Signed)
  Transition of Care [facility]) Screening Note   Patient Details  Name: Martin Frazier. Date of Birth: May 24, 1934   Transition of Care [facility]) CM/SW Contact:    Annice Needy, LCSW Phone Number: 04/12/2023, 2:41 PM    Transition of Care Department [facility]) has reviewed patient and no TOC needs have been identified at this time. We will continue to monitor patient advancement through interdisciplinary progression rounds. If new patient transition needs arise, please place a TOC consult.

## 2023-04-12 NOTE — Transfer of Care (Signed)
Immediate Anesthesia Transfer of Care Note  Patient: Wai Solt.  Procedure(s) Performed: COLONOSCOPY WITH PROPOFOL ESOPHAGOGASTRODUODENOSCOPY (EGD) WITH PROPOFOL ESOPHAGEAL DILATION HOT HEMOSTASIS (ARGON PLASMA COAGULATION/BICAP)  Patient Location: PACU  Anesthesia Type:General  Level of Consciousness: sedated  Airway & Oxygen Therapy: Patient Spontanous Breathing and Patient connected to nasal cannula oxygen  Post-op Assessment: Report given to RN and Post -op Vital signs reviewed and stable  Post vital signs: Reviewed and stable  Last Vitals:  Vitals Value Taken Time  BP 143/125 04/12/23 1445  Temp 97.5   Pulse 90   Resp 16   SpO2 96   Vitals shown include unvalidated device data.  Last Pain:  Vitals:   04/12/23 1409  TempSrc:   PainSc: 0-No pain         Complications: No notable events documented.

## 2023-04-12 NOTE — Brief Op Note (Signed)
04/11/2023 - 04/12/2023  2:16 PM  PATIENT:  Martin Frazier.  89 y.o. male  PRE-OPERATIVE DIAGNOSIS:  iron deficiency anemia, hemoccult positive stool, acid reflux, esophageal dysphagia, upper abdominal pain, constipation  POST-OPERATIVE DIAGNOSIS:  UPPER: Savory dilation, otherwise normal exam  LOWER::  PROCEDURE:  Procedure(s): COLONOSCOPY WITH PROPOFOL (N/A) ESOPHAGOGASTRODUODENOSCOPY (EGD) WITH PROPOFOL (N/A) ESOPHAGEAL DILATION (N/A)  Patient underwent EGD and colonoscopy under propofol sedation.  Tolerated the procedure adequately.    EGD FINDINGS: - No endoscopic esophageal abnormality to explain patient's dysphagia.  Esophagus dilated.   - Normal stomach.  - Normal examined duodenum.   COLONOSCOPY FINDINGS: - Hemorrhoids found on perianal exam.  - Two non-bleeding colonic angiodysplastic lesions.  Treated with argon plasma coagulation (APC).  - Non-bleeding internal hemorrhoids.   RECOMMENDATIONS - Return patient to hospital ward for ongoing care.  - Resume previous diet.  - Repeat colonoscopy is not recommended due to current age (48 years or older) for screening purposes.  - Follow up in GI clinic in 3-4 weeks. If recurrent worsening anemia, will consider capsule endoscopy. - GI service will sign-off, please call us back if you have any more questions.  Katrinka Blazing, MD Gastroenterology and Hepatology [facility]

## 2023-04-12 NOTE — Progress Notes (Signed)
Tap water enema administered per MD orders. Patient able to tolerate full enema with 1 BM returned. See flowsheet documentation.

## 2023-04-12 NOTE — Progress Notes (Signed)
Patient unable to tolerate full bowel prep. Primary RN offered constant reminders and encouragement to patient throughout shift. Patient declines to continue bowel prep at this time. Will pass along to day RN.

## 2023-04-12 NOTE — Op Note (Signed)
[facility] Patient Name: Martin Frazier Procedure Date: 04/12/2023 1:57 PM MRN: [id number] Date of Birth: 03-Dec-1934 Attending MD: Katrinka Blazing , , [id number] CSN: [id number] Age: 89 Admit Type: Outpatient Procedure:                Colonoscopy Indications:              Iron deficiency anemia Providers:                Katrinka Blazing, [place] Scrape RN, RN,                            Dyann Ruddle Referring MD:              Medicines:                Monitored Anesthesia Care Complications:            No immediate complications. Estimated Blood Loss:     Estimated blood loss: none. Procedure:                Pre-Anesthesia Assessment:                           - Prior to the procedure, a History and Physical                            was performed, and patient medications, allergies                            and sensitivities were reviewed. The patient's                            tolerance of previous anesthesia was reviewed.                           - The risks and benefits of the procedure and the                            sedation options and risks were discussed with the                            patient. All questions were answered and informed                            consent was obtained.                           - ASA Grade Assessment: III - A patient with severe                            systemic disease.                           After obtaining informed consent, the colonoscope                            was passed under direct vision. Throughout the  procedure, the patient's blood pressure, pulse, and                            oxygen saturations were monitored continuously. The                            [id number]-HQ190L ([id number]) scope was introduced through                            the anus and advanced to the the cecum, identified                            by appendiceal orifice and ileocecal valve. The                             colonoscopy was performed without difficulty. The                            patient tolerated the procedure well. The quality                            of the bowel preparation was good. Scope In: 2:19:33 PM Scope Out: 2:37:57 PM Scope Withdrawal Time: 0 hours 11 minutes 53 seconds  Total Procedure Duration: 0 hours 18 minutes 24 seconds  Findings:      External hemorrhoids were found on perianal exam.      Two medium-sized angiodysplastic lesions without bleeding were found in       the ascending colon. Coagulation for bleeding prevention using argon       plasma at 0.3 liters/minute and 20 watts was successful.      Non-bleeding internal hemorrhoids were found during retroflexion. The       hemorrhoids were small. Impression:               - Hemorrhoids found on perianal exam.                           - Two non-bleeding colonic angiodysplastic lesions.                            Treated with argon plasma coagulation (APC).                           - Non-bleeding internal hemorrhoids.                           - No specimens collected. Moderate Sedation:      Per Anesthesia Care Recommendation:           - Return patient to hospital ward for ongoing care.                           - Resume previous diet.                           - Repeat colonoscopy is not recommended due to  current age (75 years or older) for screening                            purposes.                           - Follow up in GI clinic in 3-4 weeks. If recurrent                            worsening anemia, will consider capsule endoscopy. Procedure Code(s):        --- Professional ---                           [phone], Colonoscopy, flexible; with control of                            bleeding, any method Diagnosis Code(s):        --- Professional ---                           K55.20, Angiodysplasia of colon without hemorrhage                           K64.8, Other hemorrhoids                            D50.9, Iron deficiency anemia, unspecified CPT copyright 2022 American Medical Association. All rights reserved. The codes documented in this report are preliminary and upon coder review may  be revised to meet current compliance requirements. Katrinka Blazing, MD Katrinka Blazing,  04/12/2023 2:45:45 PM This report has been signed electronically. Number of Addenda: 0

## 2023-04-12 NOTE — Progress Notes (Signed)
  Transition of Care [facility]) Screening Note   Patient Details  Name: Martin Frazier. Date of Birth: 11/14/1934   Transition of Care [facility]) CM/SW Contact:    Annice Needy, LCSW Phone Number: 04/12/2023, 11:02 AM    Transition of Care Department [facility]) has reviewed patient and no TOC needs have been identified at this time. We will continue to monitor patient advancement through interdisciplinary progression rounds. If new patient transition needs arise, please place a TOC consult.

## 2023-04-12 NOTE — Anesthesia Postprocedure Evaluation (Signed)
Anesthesia Post Note  Patient: [name].  Procedure(s) Performed: COLONOSCOPY WITH PROPOFOL ESOPHAGOGASTRODUODENOSCOPY (EGD) WITH PROPOFOL ESOPHAGEAL DILATION HOT HEMOSTASIS (ARGON PLASMA COAGULATION/BICAP)  Patient location during evaluation: Phase II Anesthesia Type: General Level of consciousness: awake Pain management: pain level controlled Vital Signs Assessment: post-procedure vital signs reviewed and stable Respiratory status: spontaneous breathing and respiratory function stable Cardiovascular status: blood pressure returned to baseline and stable Postop Assessment: no headache and no apparent nausea or vomiting Anesthetic complications: no Comments: Late entry   No notable events documented.   Last Vitals:  Vitals:   04/12/23 1530 04/12/23 1543  BP: 136/68 (!) 136/55  Pulse: 86 86  Resp: 20 18  Temp:    SpO2: 92% 100%    Last Pain:  Vitals:   04/12/23 1543  TempSrc:   PainSc: 0-No pain                 Windell Norfolk

## 2023-04-13 DIAGNOSIS — I10 Essential (primary) hypertension: Secondary | ICD-10-CM | POA: Diagnosis not present

## 2023-04-13 DIAGNOSIS — E785 Hyperlipidemia, unspecified: Secondary | ICD-10-CM | POA: Diagnosis not present

## 2023-04-13 DIAGNOSIS — K5901 Slow transit constipation: Secondary | ICD-10-CM | POA: Diagnosis not present

## 2023-04-13 DIAGNOSIS — D649 Anemia, unspecified: Secondary | ICD-10-CM | POA: Diagnosis not present

## 2023-04-13 LAB — BASIC METABOLIC PANEL
Anion gap: 8 (ref 5–15)
BUN: 24 mg/dL — ABNORMAL HIGH (ref 8–23)
CO2: 27 mmol/L (ref 22–32)
Calcium: 8.3 mg/dL — ABNORMAL LOW (ref 8.9–10.3)
Chloride: 101 mmol/L (ref 98–111)
Creatinine, Ser: 1.94 mg/dL — ABNORMAL HIGH (ref 0.61–1.24)
GFR, Estimated: 33 mL/min — ABNORMAL LOW (ref >60)
Glucose, Bld: 104 mg/dL — ABNORMAL HIGH (ref 70–99)
Potassium: 3.7 mmol/L (ref 3.5–5.1)
Sodium: 136 mmol/L (ref 135–145)

## 2023-04-13 LAB — GLUCOSE, CAPILLARY
Glucose-Capillary: 121 mg/dL — ABNORMAL HIGH (ref 70–99)
Glucose-Capillary: 234 mg/dL — ABNORMAL HIGH (ref 70–99)

## 2023-04-13 LAB — CBC
HCT: 27 % — ABNORMAL LOW (ref 39.0–52.0)
Hemoglobin: 8.5 g/dL — ABNORMAL LOW (ref 13.0–17.0)
MCH: 30.4 pg (ref 26.0–34.0)
MCHC: 31.5 g/dL (ref 30.0–36.0)
MCV: 96.4 fL (ref 80.0–100.0)
Platelets: 197 10*3/uL (ref 150–400)
RBC: 2.8 MIL/uL — ABNORMAL LOW (ref 4.22–5.81)
RDW: 15.9 % — ABNORMAL HIGH (ref 11.5–15.5)
WBC: 6.4 10*3/uL (ref 4.0–10.5)
nRBC: 0 % (ref 0.0–0.2)

## 2023-04-13 MED ORDER — POLYETHYLENE GLYCOL 3350 17 G PO PACK: 17.0000 g | PACK | Freq: Every day | ORAL | 1 refills | Status: DC

## 2023-04-13 MED ORDER — PANTOPRAZOLE SODIUM 40 MG PO TBEC: 40.0000 mg | DELAYED_RELEASE_TABLET | Freq: Two times a day (BID) | ORAL | 1 refills | Status: DC

## 2023-04-13 MED ORDER — FUROSEMIDE 20 MG PO TABS: 20.0000 mg | ORAL_TABLET | Freq: Every day | ORAL | Status: DC

## 2023-04-13 MED ORDER — CLOPIDOGREL BISULFATE 75 MG PO TABS: 75.0000 mg | ORAL_TABLET | Freq: Every day | ORAL | Status: DC

## 2023-04-13 MED ORDER — DOCUSATE SODIUM 100 MG PO CAPS: 100.0000 mg | ORAL_CAPSULE | Freq: Two times a day (BID) | ORAL | 1 refills | Status: DC

## 2023-04-13 NOTE — Discharge Summary (Signed)
Physician Discharge Summary   Patient: Martin Frazier. MRN: [id number] DOB: [birth year]  Admit date:     04/11/2023  Discharge date: 04/13/23  Discharge Physician: Vassie Loll   PCP: [facility]   Recommendations at discharge:  Repeat CBC to follow hemoglobin trend/stability Make sure patient has follow-up with gastroenterology service as instructed Repeat basic metabolic panel to follow electrolytes and renal function.   Discharge Diagnoses: Principal Problem:   Symptomatic anemia Active Problems:   Constipation   Diabetes (HCC)   Essential hypertension, benign   GERD (gastroesophageal reflux disease)   HLD (hyperlipidemia)   Chronic diastolic HF (heart failure)   Chronic kidney disease, stage 3b  Hospital Course: Martin Newhart. is a 89 y.o. 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  Patient's symptoms initiated in midepigastric area with radiation to mid chest and both arms, patient to Tums without relief and then use his nitroglycerin which seemed to help improving discomfort.  Presented to the hospital for further evaluation and management. At time of my examination patient reports no active chest pain.  Expressed feeling weak and tired especially with associated dyspnea on exertion.   Recent visit to the gastroenterology service has demonstrated positive fecal occult blood test and blood work in the ED demonstrating hemoglobin down to 6.1.  Patient was pale on examination and chronically ill in appearance.   Patient reports ongoing intermittent mid epigastric discomfort and reflux symptoms that have been going for years and also  lately has noticed some dysphagia.  Patient reports to be compliant with medications.   Gastroenterology service and cardiology service has been consulted and [facility] contacted to place patient in the hospital for further evaluation and management.  2 large IV ports and 1 unit of blood has been initiated to be transfused along with IV PPI.  Assessment and Plan: * Symptomatic anemia -Patient with underlying history of chronic anemia secondary to chronic renal failure; also with concerns for GI bleed -Endoscopic evaluation no revealing acute source of GI bleeding; colonoscopy with couple AVMs treated with ACP but no active bleed demonstrating stigmata of acute bleeding. -Patient will continue PPI twice a day; no NSAIDs and at time of discharge no more aspirin. -Safe to resume the use of Plavix for CVA prophylaxis on 04/15/2023. -Repeat CBC to follow hemoglobin trend as an outpatient and if needed pursuit capsule endoscopy evaluation.  Chronic kidney disease, stage 3b -Currently stable and at baseline -Patient renal failure associated with history of diabetes -Minimize nephrotoxic agents and avoid hypotension -Continue to follow renal function trend.  Chronic diastolic HF (heart failure) -Appears to be compensated for the most part. -Continue to follow daily weights and low-sodium diet -Resume home diuretic regimen and follow-up with cardiology as an outpatient. -Good saturation on room air, no complaints of orthopnea during today evaluation.   HLD (hyperlipidemia) -Continue statin. -Heart healthy diet discussed with patient.  GERD (gastroesophageal reflux disease) -Continue PPI, currently twice a day. -Lifestyle modifications discussed with patient.  Essential hypertension, benign -Stable overall -Continue home antihypertensive agents. -Heart healthy/low-sodium diet discussed with patient.  Diabetes (HCC) -Resume home oral hypoglycemic agents. -A1c 6.3 recently -Continue to follow  CBGs fluctuation and adjust hypoglycemic regimen as required.  Constipation -Most likely associated with the use of iron supplementation -Planning to start bowel regimen using Colace and MiraLAX at discharge. -Follow response as an outpatient and further adjust regimen as needed.  Consultants: Cardiology and gastroenterology service. Procedures performed: See below for x-ray report; for endoscopy/colonoscopy results as per GI documentation. Disposition: Home Diet recommendation: Modified carbohydrates and heart healthy diet.  DISCHARGE MEDICATION: Allergies as of 04/13/2023       Reactions   Sulfa Antibiotics Nausea And Vomiting        Medication List     STOP taking these medications    aspirin EC 81 MG tablet       TAKE these medications    acetaminophen 325 MG tablet Commonly known as: TYLENOL Take 2 tablets (650 mg total) by mouth every 4 (four) hours as needed for mild pain (or temp > 37.5 C (99.5 F)).   atorvastatin 80 MG tablet Commonly known as: LIPITOR Take 1 tablet (80 mg total) by mouth daily.   clopidogrel 75 MG tablet Commonly known as: PLAVIX Take 1 tablet (75 mg total) by mouth daily. Start taking on: April 15, 2023 What changed: These instructions start on April 15, 2023. If you are unsure what to do until then, ask your doctor or other care provider.   cyanocobalamin 1000 MCG tablet Commonly known as: VITAMIN B12 Take 1 tablet (1,000 mcg total) by mouth daily. What changed: Another medication with the same name was removed. Continue taking this medication, and follow the directions you see here.   docusate sodium 100 MG capsule Commonly known as: Colace Take 1 capsule (100 mg total) by mouth 2 (two) times daily.   ferrous sulfate 325 (65 FE) MG EC tablet Take 325 mg by mouth daily with breakfast.   furosemide 20 MG tablet Commonly known as: LASIX Take 1 tablet (20 mg total) by mouth daily.   glimepiride 1 MG tablet Commonly known as:  AMARYL Take 1 tablet (1 mg total) by mouth daily with breakfast.   latanoprost 0.005 % ophthalmic solution Commonly known as: XALATAN Place 1 drop into both eyes at bedtime.   nitroGLYCERIN 0.4 MG SL tablet Commonly known as: Nitrostat Place 1 tablet (0.4 mg total) under the tongue every 5 (five) minutes as needed for chest pain.   ONE-A-DAY 50 PLUS PO Take by mouth.   pantoprazole 40 MG tablet Commonly known as: PROTONIX Take 1 tablet (40 mg total) by mouth 2 (two) times daily. What changed: when to take this   polyethylene glycol 17 g packet Commonly known as: MiraLax Take 17 g by mouth daily. Hold for diarrhea.   tamsulosin 0.4 MG Caps capsule Commonly known as: FLOMAX Take 1 capsule (0.4 mg total) by mouth daily.   Vitamin D 50 MCG (2000 UT) Caps Take 1 capsule (2,000 Units total) by mouth daily.        Follow-up Information     [facility]. Schedule an appointment as soon as possible for a visit in 10 day(s).   Contact information: [address] [place] [address] [phone]                Discharge Exam: Ceasar Mons Weights   04/11/23 1400 04/12/23 0559 04/13/23 0512  Weight: 60.6 kg 58.3 kg 56.5 kg   General exam: Alert, awake, oriented x 3; no chest pain, no shortness of breath, no overt bleeding. Respiratory system: Clear to auscultation. Respiratory effort normal.  Good saturation on room air. Cardiovascular system: Rate controlled,  no rubs, no gallops, no JVD. Gastrointestinal system: Abdomen is nondistended, soft and nontender. No organomegaly or masses felt. Normal bowel sounds heard. Central nervous system: Alert and oriented. No focal neurological deficits. Extremities: No C/C/E, +pedal pulses Skin: No petechiae. Psychiatry: Judgement and insight appear normal. Mood & affect appropriate.    Condition at discharge: Stable and improved.  The results of significant diagnostics from this hospitalization (including imaging,  microbiology, ancillary and laboratory) are listed below for reference.   Imaging Studies: DG Chest 2 View  Result Date: 04/11/2023 CLINICAL DATA:  Chest pain EXAM: CHEST - 2 VIEW COMPARISON:  Previous studies including the examination of 04/17/2022 FINDINGS: Cardiac size is within normal limits. There are no signs of pulmonary edema or focal pulmonary consolidation. Increase in the AP diameter of chest suggests COPD. There is no pleural effusion or pneumothorax. IMPRESSION: There are no signs of pulmonary edema or focal pulmonary consolidation. Electronically Signed   By: Ernie Avena M.D.   On: 04/11/2023 07:59   DG Abd 1 View  Result Date: 04/01/2023 CLINICAL DATA:  Follow-up kidney stone EXAM: ABDOMEN - 1 VIEW COMPARISON:  08/23/2021 FINDINGS: 12 x 12 mm calculus RIGHT kidney. 4 mm calculus LEFT kidney. No additional urinary tract calcifications. Nonobstructive bowel gas pattern. Osseous demineralization with advanced degenerative disc and facet disease changes lumbar spine associated with dextroconvex scoliosis. Lung bases clear. IMPRESSION: BILATERAL renal calculi as above. Electronically Signed   By: Ulyses Southward M.D.   On: 04/01/2023 17:04   ECHOCARDIOGRAM COMPLETE  Result Date: 03/18/2023    ECHOCARDIOGRAM REPORT   Patient Name:   Martin Frazier. Date of Exam: 03/18/2023 Medical Rec #:  [id number]       Height:       63.0 in Accession #:    [id number]      Weight:       137.2 lb Date of Birth:  04/19/1934       BSA:          1.647 m Patient Age:    88 years        BP:           147/72 mmHg Patient Gender: M               HR:           81 bpm. [facility] Procedure: 2D Echo, Cardiac Doppler and Color Doppler Indications:    SOB (shortness of breath)  History:        Patient has prior history of Echocardiogram examinations, most                 recent 04/18/2022. CHF, Stroke; Risk Factors:Hypertension,                 Diabetes and Dyslipidemia.  Sonographer:    Celesta Gentile RCS  Referring Phys: [id number] Dorothe Pea BRANCH IMPRESSIONS  1. Left ventricular ejection fraction, by estimation, is 60 to 65%. The left ventricle has normal function. The left ventricle demonstrates regional wall motion abnormalities, basal inferior hypokinesis. Left ventricular diastolic parameters are consistent with Grade I diastolic dysfunction (impaired relaxation).  2. Right ventricular systolic function is normal. The right ventricular size is normal. There is normal pulmonary artery systolic pressure. The estimated right ventricular systolic pressure is 20.6 mmHg.  3. Left atrial size was mild to moderately dilated.  4. The mitral valve is abnormal. Mild mitral valve regurgitation. No evidence of mitral stenosis. Moderate mitral annular  calcification.  5. The aortic valve is tricuspid. Aortic valve regurgitation is mild. No aortic stenosis is present.  6. The inferior vena cava is normal in size with greater than 50% respiratory variability, suggesting right atrial pressure of 3 mmHg. Comparison(s): No significant change from prior study. FINDINGS  Left Ventricle: Left ventricular ejection fraction, by estimation, is 60 to 65%. The left ventricle has normal function. The left ventricle demonstrates regional wall motion abnormalities. The left ventricular internal cavity size was normal in size. There is no left ventricular hypertrophy. Left ventricular diastolic parameters are consistent with Grade I diastolic dysfunction (impaired relaxation).  LV Wall Scoring: The basal inferior segment is hypokinetic. Right Ventricle: The right ventricular size is normal. No increase in right ventricular wall thickness. Right ventricular systolic function is normal. There is normal pulmonary artery systolic pressure. The tricuspid regurgitant velocity is 2.10 m/s, and  with an assumed right atrial pressure of 3 mmHg, the estimated right ventricular systolic pressure is 20.6 mmHg. Left Atrium: Left atrial size was mild to  moderately dilated. Right Atrium: Right atrial size was normal in size. Pericardium: There is no evidence of pericardial effusion. Mitral Valve: The mitral valve is abnormal. Moderate mitral annular calcification. Mild mitral valve regurgitation. No evidence of mitral valve stenosis. Tricuspid Valve: The tricuspid valve is normal in structure. Tricuspid valve regurgitation is trivial. No evidence of tricuspid stenosis. Aortic Valve: The aortic valve is tricuspid. Aortic valve regurgitation is mild. No aortic stenosis is present. Pulmonic Valve: The pulmonic valve was normal in structure. Pulmonic valve regurgitation is trivial. No evidence of pulmonic stenosis. Aorta: The aortic root is normal in size and structure. Venous: The inferior vena cava is normal in size with greater than 50% respiratory variability, suggesting right atrial pressure of 3 mmHg. IAS/Shunts: No atrial level shunt detected by color flow Doppler.  LEFT VENTRICLE PLAX 2D LVIDd:         4.30 cm   Diastology LVIDs:         3.00 cm   LV e' medial:    4.24 cm/s LV PW:         1.10 cm   LV E/e' medial:  13.4 LV IVS:        1.10 cm   LV e' lateral:   7.18 cm/s LVOT diam:     2.00 cm   LV E/e' lateral: 7.9 LV SV:         63 LV SV Index:   38 LVOT Area:     3.14 cm  RIGHT VENTRICLE RV S prime:     10.40 cm/s TAPSE (M-mode): 1.8 cm LEFT ATRIUM           Index        RIGHT ATRIUM           Index LA diam:      3.30 cm 2.00 cm/m   RA Area:     16.30 cm LA Vol (A2C): 66.1 ml 40.12 ml/m  RA Volume:   37.50 ml  22.76 ml/m LA Vol (A4C): 57.0 ml 34.60 ml/m  AORTIC VALVE LVOT Vmax:   79.00 cm/s LVOT Vmean:  47.700 cm/s LVOT VTI:    0.199 m  AORTA Ao Root diam: 3.60 cm MITRAL VALVE                TRICUSPID VALVE MV Area (PHT): 1.78 cm     TR Peak grad:   17.6 mmHg MV Decel Time: 426 msec  TR Vmax:        210.00 cm/s MV E velocity: 57.00 cm/s MV A velocity: 121.00 cm/s  SHUNTS MV E/A ratio:  0.47         Systemic VTI:  0.20 m                              Systemic Diam: 2.00 cm Vishnu Priya Mallipeddi Electronically signed by Winfield Rast Mallipeddi Signature Date/Time: 03/18/2023/4:25:01 PM    Final     Microbiology: Results for orders placed or performed in visit on 04/01/23  Microscopic Examination     Status: Abnormal   Collection Time: 04/01/23  3:43 PM   Urine  Result Value Ref Range Status   WBC, UA 0-5 0 - 5 /hpf Final   RBC, Urine 3-10 (A) 0 - 2 /hpf Final   Epithelial Cells (non renal) 0-10 0 - 10 /hpf Final   Casts Present (A) None seen /lpf Final   Cast Type Granular casts (A) N/A Final   Bacteria, UA None seen None seen/Few Final    Labs: CBC: Recent Labs  Lab 04/11/23 0732 04/11/23 1527 04/12/23 0152 04/12/23 0459 04/13/23 0508  WBC 6.1 4.9 7.0 6.1 6.4  NEUTROABS 4.0  --   --   --   --   HGB 6.1* 6.8* 8.7* 8.8* 8.5*  HCT 19.5* 21.7* 27.0* 27.3* 27.0*  MCV 103.2* 99.5 95.1 95.5 96.4  PLT 218 190 201 210 197   Basic Metabolic Panel: Recent Labs  Lab 04/11/23 0732 04/12/23 0459 04/13/23 0508  NA 139 135 136  K 3.9 3.4* 3.7  CL 106 103 101  CO2 GLUCOSE 140* 115* 104*  BUN 28* 22 24*  CREATININE 1.63* 1.53* 1.94*  CALCIUM 8.8* 8.4* 8.3*  MG  --  2.0  --    Liver Function Tests: Recent Labs  Lab 04/11/23 0732  AST 25  ALT 20  ALKPHOS 189*  BILITOT 0.6  PROT 5.9*  ALBUMIN 3.5   CBG: Recent Labs  Lab 04/12/23 1616 04/12/23 2104 04/12/23 2232 04/13/23 0811 04/13/23 1152  GLUCAP 148* 223* 143* 121* 234*    Discharge time spent: greater than 30 minutes.  Signed: Vassie Loll, MD [facility] 04/13/2023

## 2023-04-13 NOTE — Progress Notes (Signed)
Discharge instructions provided to patient. All medications, follow up appointments, and discharge instructions provided. IV out. Monitor off. CCMD notified. Discharging home with wife.   

## 2023-04-18 ENCOUNTER — Encounter (HOSPITAL_COMMUNITY): Payer: Self-pay | Admitting: Gastroenterology

## 2023-05-10 ENCOUNTER — Ambulatory Visit: Payer: Medicare Other | Admitting: Cardiology

## 2023-05-13 ENCOUNTER — Encounter: Payer: Self-pay | Admitting: Gastroenterology

## 2023-05-13 ENCOUNTER — Ambulatory Visit (INDEPENDENT_AMBULATORY_CARE_PROVIDER_SITE_OTHER): Payer: Medicare Other | Admitting: Gastroenterology

## 2023-05-13 VITALS — BP 155/66 | HR 81 | Temp 97.6°F | Ht 63.0 in | Wt 142.6 lb

## 2023-05-13 DIAGNOSIS — K219 Gastro-esophageal reflux disease without esophagitis: Secondary | ICD-10-CM | POA: Diagnosis not present

## 2023-05-13 DIAGNOSIS — D500 Iron deficiency anemia secondary to blood loss (chronic): Secondary | ICD-10-CM

## 2023-05-13 NOTE — Progress Notes (Addendum)
GI Office Note    Referring Provider: [facility] Primary Care Physician:  [facility]  Primary Gastroenterologist: Dr. Levon Hedger  Chief Complaint   Chief Complaint  Patient presents with   Follow-up    Doing well    History of Present Illness   Martin Sancen. is a 89 y.o. male presenting today for hospital follow up. H/o GERD, RUQ pain, IDA.  Seen in 03/2023 with several GI concerns. Chronic digestive issues, reflux for years. Diminished appetite. No weight loss. Some upper abd pain, better with vinegar water with meals. Used digestive enzymes when eating out, easier to take with him then vinegar water. Also on pantoprazole every am. Intermittent dysphagia to solid foods. Decline in Hgb from 11 to 8 since 04/2023. Stool heme positive. Prior to completing gi work up he presented to the ED with Hgb of 6.   Also with B12 difeiciency on oral supplement. Hemoglobin at time of discharge on April 13 was 8.5.  Labs from April 23, 2023: Hemoglobin 8.2, hematocrit 28, MCV 103.7, B12 1148, iron 15, TIBC 328, iron saturation is 5%, folate 15.5. EGD/colonoscopy completed as outlined below.   Today: was doing better for few weeks after in the hospital but now having more issues again. Heartburn symptoms, DOE. Taking iron 65 qod, then after in the hospital taking 65 (325mg ) every two days, then advised by PCP to go to two tablets every 2 days. Having some constipation issues. Eating raisin bran. He reports swallowing better. Denies melena, brbpr. Complains of lower ext edema.   Colonoscopy April 12, 2023: -Hemorrhoids found on perianal exam -2 nonbleeding colonic angiodysplastic lesions treated with APC -Nonbleeding internal hemorrhoids  EGD April 12, 2023: -Normal-appearing esophagus status post dilation -Normal stomach -Normal examined colon  Medications   Current Outpatient Medications  Medication Sig Dispense Refill   acetaminophen (TYLENOL) 325 MG tablet  Take 2 tablets (650 mg total) by mouth every 4 (four) hours as needed for mild pain (or temp > 37.5 C (99.5 F)).     atorvastatin (LIPITOR) 80 MG tablet Take 1 tablet (80 mg total) by mouth daily. 90 tablet 3   Cholecalciferol (VITAMIN D) 50 MCG (2000 UT) CAPS Take 1 capsule (2,000 Units total) by mouth daily. 30 capsule 0   clopidogrel (PLAVIX) 75 MG tablet Take 1 tablet (75 mg total) by mouth daily.     docusate sodium (COLACE) 100 MG capsule Take 1 capsule (100 mg total) by mouth 2 (two) times daily. 60 capsule 1   ferrous sulfate 325 (65 FE) MG EC tablet Take 325 mg by mouth daily with breakfast.     furosemide (LASIX) 20 MG tablet Take 1 tablet (20 mg total) by mouth daily.     glimepiride (AMARYL) 1 MG tablet Take 1 tablet (1 mg total) by mouth daily with breakfast. 30 tablet 0   latanoprost (XALATAN) 0.005 % ophthalmic solution Place 1 drop into both eyes at bedtime. 2.5 mL 12   Multiple Vitamins-Minerals (ONE-A-DAY 50 PLUS PO) Take by mouth.     nitroGLYCERIN (NITROSTAT) 0.4 MG SL tablet Place 1 tablet (0.4 mg total) under the tongue every 5 (five) minutes as needed for chest pain. 25 tablet 3   pantoprazole (PROTONIX) 40 MG tablet Take 1 tablet (40 mg total) by mouth 2 (two) times daily. 60 tablet 1   polyethylene glycol (MIRALAX) 17 g packet Take 17 g by mouth daily. Hold for diarrhea. 28 each 1  tamsulosin (FLOMAX) 0.4 MG CAPS capsule Take 1 capsule (0.4 mg total) by mouth daily. 30 capsule 0   vitamin B-12 (CYANOCOBALAMIN) 1000 MCG tablet Take 1 tablet (1,000 mcg total) by mouth daily. 30 tablet 0   No current facility-administered medications for this visit.    Allergies   Allergies as of 05/13/2023 - Review Complete 05/13/2023  Allergen Reaction Noted   Sulfa antibiotics Nausea And Vomiting 12/31/2013    Review of Systems   General: Negative for anorexia, weight loss, fever, chills, fatigue, +weakness. ENT: Negative for hoarseness, difficulty swallowing , nasal  congestion. CV: Negative for chest pain, angina, palpitations, +dyspnea on exertion, peripheral edema.  Respiratory: Negative for dyspnea at rest, +dyspnea on exertion, cough, sputum, wheezing.  GI: See history of present illness. GU:  Negative for dysuria, hematuria, urinary incontinence, urinary frequency, nocturnal urination.  Endo: Negative for unusual weight change.     Physical Exam   BP (!) 155/66 (BP Location: Right Arm, Patient Position: Sitting, Cuff Size: Normal)   Pulse 81   Temp 97.6 F (36.4 C) (Oral)   Ht 5\' 3"  (1.6 m)   Wt 142 lb 9.6 oz (64.7 kg)   SpO2 97%   BMI 25.26 kg/m    General: Well-nourished, well-developed in no acute distress.  Eyes: No icterus. Mouth: Oropharyngeal mucosa moist and pink   Lungs: Clear to auscultation bilaterally.  Heart: Regular rate and rhythm, no murmurs rubs or gallops.  Abdomen: Bowel sounds are normal, nontender, nondistended, no hepatosplenomegaly or masses,  no abdominal bruits or hernia , no rebound or guarding.  Rectal: not performed  Extremities: No lower extremity edema. No clubbing or deformities. Neuro: Alert and oriented x 4   Skin: Warm and dry, no jaundice.   Psych: Alert and cooperative, normal mood and affect.  Labs   Lab Results  Component Value Date   CREATININE 1.94 (H) 04/13/2023   BUN 24 (H) 04/13/2023   NA 136 04/13/2023   K 3.7 04/13/2023   CL 101 04/13/2023   CO2 27 04/13/2023     Lab Results  Component Value Date   WBC 4.8 05/13/2023   HGB 8.5 (L) 05/13/2023   HCT 26.5 (L) 05/13/2023   MCV 94 05/13/2023   PLT 196 05/13/2023   Lab Results  Component Value Date   IRON 21 (L) 05/13/2023   TIBC 365 05/13/2023   FERRITIN 35 05/13/2023   Lab Results  Component Value Date   VITAMINB12 1,135 (H) 04/11/2023   Lab Results  Component Value Date   FOLATE 15.2 03/23/2020    Imaging Studies   No results found.  Assessment/Plan:   GERD: some breakthrough symptoms. Reinforced antireflux  measures.   IDA: likely chronic occult gi bleeding in setting of AVMs. Increase dietary iron, increase oral iron, await lab findings.   Constipation: add stool softener or miralax daily as needed.    The patient was found to have elevated blood pressure when vital signs were checked in the office. The blood pressure was rechecked by the nursing staff and it was found be persistently elevated >140/90 mmHg. I personally advised to the patient to follow up closely with his PCP for hypertension control.    Martin Frazier, MHS, PA-C [facility]  I have reviewed the note and agree with the APP's assessment as described in this progress note  Katrinka Blazing, MD Gastroenterology and Hepatology [facility]

## 2023-05-13 NOTE — Patient Instructions (Signed)
Continue pantoprazole twice daily before a meal. Furosemide is your fluid pill, recommend taking daily as prescribed.  You can take one over the counter iron each day along with your prescription iron as ordered. We may change after your labs result.  Please complete labs at [facility]. [address] [place]. Eat iron rich diet.  Take stool softener or miralax daily if needed for constipation.

## 2023-05-14 LAB — IRON,TIBC AND FERRITIN PANEL
Ferritin: 35 ng/mL (ref 30–400)
Iron Saturation: 6 % — CL (ref 15–55)
Iron: 21 ug/dL — ABNORMAL LOW (ref 38–169)
Total Iron Binding Capacity: 365 ug/dL (ref 250–450)
UIBC: 344 ug/dL — ABNORMAL HIGH (ref 111–343)

## 2023-05-14 LAB — CBC WITH DIFFERENTIAL/PLATELET
Basophils Absolute: 0 10*3/uL (ref 0.0–0.2)
Basos: 1 %
EOS (ABSOLUTE): 0.1 10*3/uL (ref 0.0–0.4)
Eos: 3 %
Hematocrit: 26.5 % — ABNORMAL LOW (ref 37.5–51.0)
Hemoglobin: 8.5 g/dL — ABNORMAL LOW (ref 13.0–17.7)
Immature Grans (Abs): 0 10*3/uL (ref 0.0–0.1)
Immature Granulocytes: 0 %
Lymphocytes Absolute: 1.3 10*3/uL (ref 0.7–3.1)
Lymphs: 26 %
MCH: 30.2 pg (ref 26.6–33.0)
MCHC: 32.1 g/dL (ref 31.5–35.7)
MCV: 94 fL (ref 79–97)
Monocytes Absolute: 0.5 10*3/uL (ref 0.1–0.9)
Monocytes: 11 %
Neutrophils Absolute: 2.8 10*3/uL (ref 1.4–7.0)
Neutrophils: 59 %
Platelets: 196 10*3/uL (ref 150–450)
RBC: 2.81 x10E6/uL — ABNORMAL LOW (ref 4.14–5.80)
RDW: 13.7 % (ref 11.6–15.4)
WBC: 4.8 10*3/uL (ref 3.4–10.8)

## 2023-05-20 ENCOUNTER — Telehealth: Payer: Self-pay

## 2023-05-20 ENCOUNTER — Other Ambulatory Visit: Payer: Self-pay

## 2023-05-20 DIAGNOSIS — D500 Iron deficiency anemia secondary to blood loss (chronic): Secondary | ICD-10-CM

## 2023-05-20 NOTE — Telephone Encounter (Signed)
Pt's wife is requesting [facility]

## 2023-05-23 ENCOUNTER — Encounter: Payer: Self-pay | Admitting: Gastroenterology

## 2023-05-23 MED ORDER — FERROUS SULFATE 325 (65 FE) MG PO TBEC: 325.0000 mg | DELAYED_RELEASE_TABLET | Freq: Two times a day (BID) | ORAL | 1 refills | Status: DC

## 2023-05-23 NOTE — Addendum Note (Signed)
Addended by: Tiffany Kocher on: 05/23/2023 06:00 AM   Modules accepted: Orders

## 2023-07-05 ENCOUNTER — Other Ambulatory Visit: Payer: Self-pay

## 2023-07-05 DIAGNOSIS — D500 Iron deficiency anemia secondary to blood loss (chronic): Secondary | ICD-10-CM

## 2023-07-15 ENCOUNTER — Encounter: Payer: Self-pay | Admitting: Urology

## 2023-07-15 ENCOUNTER — Ambulatory Visit: Payer: Medicare Other | Admitting: Urology

## 2023-07-15 VITALS — BP 144/72 | HR 84

## 2023-07-15 DIAGNOSIS — N200 Calculus of kidney: Secondary | ICD-10-CM

## 2023-07-15 DIAGNOSIS — R3129 Other microscopic hematuria: Secondary | ICD-10-CM

## 2023-07-15 DIAGNOSIS — R972 Elevated prostate specific antigen [PSA]: Secondary | ICD-10-CM

## 2023-07-15 DIAGNOSIS — N401 Enlarged prostate with lower urinary tract symptoms: Secondary | ICD-10-CM | POA: Diagnosis not present

## 2023-07-15 DIAGNOSIS — N138 Other obstructive and reflux uropathy: Secondary | ICD-10-CM

## 2023-07-15 LAB — URINALYSIS, ROUTINE W REFLEX MICROSCOPIC
Bilirubin, UA: NEGATIVE
Ketones, UA: NEGATIVE
Leukocytes,UA: NEGATIVE
Nitrite, UA: NEGATIVE
Specific Gravity, UA: 1.025 (ref 1.005–1.030)
Urobilinogen, Ur: 0.2 mg/dL (ref 0.2–1.0)
pH, UA: 5.5 (ref 5.0–7.5)

## 2023-07-15 LAB — MICROSCOPIC EXAMINATION: Bacteria, UA: NONE SEEN

## 2023-07-15 MED ORDER — FINASTERIDE 5 MG PO TABS: 5.0000 mg | ORAL_TABLET | Freq: Every day | ORAL | 3 refills | Status: DC

## 2023-07-15 NOTE — Progress Notes (Unsigned)
07/15/2023 9:45 AM   Martin Hailstone Jr. 05/27/1934 [id number]  Referring provider: [facility], [facility] [address] [place],  [place] [address]  No chief complaint on file.   HPI:  F/u -    1) BPH-patient had a history of urinary retention in 2020. No surgery. He is on tamsulosin and started finasteride January 2024. No surgery.  Prostate was about 60 g. IPSS was 6.    2) PSA elevation-his PSA was 9.7 and 10.8 at the [place] in December 2023.  No prior prostate biopsy.  Prostate about 60 g on CT.  His January 2024 prostate exam was benign and January 2024 PSA was 7.6 prior to starting finasteride.   3) kidney stones-he underwent shockwave lithotripsy for a right ureteral stone in 2020.  Follow-up imaging showed resolution of ureteral stone and [place] no hydro in 2020. He has a known 12 mm RLP stone.    Today, seen for the above. He underwent KUB at [facility] Apr 2024 which revealed a 12 mm right renal stone, 4 mm left renal stone (similar to KUB '22, CT '20). I noticed finasteride dropped off his med list. Noc may be improved.   Last visit UA with 3-10 rbc.   Here with son Homero Fellers.    PMH: Past Medical History:  Diagnosis Date   CAD (coronary artery disease)    Collapsed lung    left   Diabetes mellitus without complication (HCC)    over 10 yrs   GERD (gastroesophageal reflux disease)    History of CVA (cerebrovascular accident)    Hypertension    Kidney stones     Surgical History: Past Surgical History:  Procedure Laterality Date   APPENDECTOMY     BIOPSY  04/01/2019   Procedure: BIOPSY;  Surgeon: Malissa Hippo, MD;  Location: [facility];  Service: Endoscopy;;  gastric   COLONOSCOPY WITH PROPOFOL N/A 04/12/2023   Procedure: COLONOSCOPY WITH PROPOFOL;  Surgeon: Dolores Frame, MD;  Location: [facility];  Service: Gastroenterology;  Laterality: N/A;   CORONARY STENT INTERVENTION N/A 04/20/2022   Procedure: CORONARY STENT INTERVENTION;  Surgeon: Marykay Lex, MD;   Location: [facility] INVASIVE CV LAB;  Service: Cardiovascular;  Laterality: N/A;   CYSTOSCOPY W/ URETERAL STENT PLACEMENT Right 04/03/2019   Procedure: CYSTOSCOPY WITH RETROGRADE PYELOGRAM/URETERAL STENT PLACEMENT;  Surgeon: Marcine Matar, MD;  Location: [facility];  Service: Urology;  Laterality: Right;   ESOPHAGEAL DILATION N/A 04/12/2023   Procedure: ESOPHAGEAL DILATION;  Surgeon: Dolores Frame, MD;  Location: [facility];  Service: Gastroenterology;  Laterality: N/A;   ESOPHAGOGASTRODUODENOSCOPY N/A 04/01/2019   Procedure: ESOPHAGOGASTRODUODENOSCOPY (EGD);  Surgeon: Malissa Hippo, MD;  Location: [facility];  Service: Endoscopy;  Laterality: N/A;  10:55am   ESOPHAGOGASTRODUODENOSCOPY (EGD) WITH PROPOFOL N/A 04/12/2023   Procedure: ESOPHAGOGASTRODUODENOSCOPY (EGD) WITH PROPOFOL;  Surgeon: Dolores Frame, MD;  [facility]: [facility];  Service: Gastroenterology;  Laterality: N/A;   EXTRACORPOREAL SHOCK WAVE LITHOTRIPSY Right 04/16/2019   Procedure: EXTRACORPOREAL SHOCK WAVE LITHOTRIPSY (ESWL);  Surgeon: Jerilee Field, MD;  Location: [facility];  Service: Urology;  Laterality: Right;   HEMORRHOID SURGERY     HOT HEMOSTASIS  04/12/2023   Procedure: HOT HEMOSTASIS (ARGON PLASMA COAGULATION/BICAP);  Surgeon: Marguerita Merles, Reuel Boom, MD;  Location: [facility];  Service: Gastroenterology;;   KIDNEY STONE SURGERY     LEFT HEART CATH AND CORONARY ANGIOGRAPHY N/A 04/20/2022   Procedure: LEFT HEART CATH AND CORONARY ANGIOGRAPHY;  Surgeon: Bryan Lemma  W, MD;  Location: [facility] INVASIVE CV LAB;  Service: Cardiovascular;  Laterality: N/A;   TONSILLECTOMY      Home Medications:  Allergies as of 07/15/2023       Reactions   Sulfa Antibiotics Nausea And Vomiting        Medication List        Accurate as of July 15, 2023  9:45 AM. If you have any questions, ask your nurse or doctor.          acetaminophen 325 MG tablet Commonly known as: TYLENOL Take 2 tablets (650 mg  total) by mouth every 4 (four) hours as needed for mild pain (or temp > 37.5 C (99.5 F)).   atorvastatin 80 MG tablet Commonly known as: LIPITOR Take 1 tablet (80 mg total) by mouth daily.   clopidogrel 75 MG tablet Commonly known as: PLAVIX Take 1 tablet (75 mg total) by mouth daily.   cyanocobalamin 1000 MCG tablet Commonly known as: VITAMIN B12 Take 1 tablet (1,000 mcg total) by mouth daily.   docusate sodium 100 MG capsule Commonly known as: Colace Take 1 capsule (100 mg total) by mouth 2 (two) times daily.   ferrous sulfate 325 (65 FE) MG EC tablet Take 1 tablet (325 mg total) by mouth 2 (two) times daily with a meal.   furosemide 20 MG tablet Commonly known as: LASIX Take 1 tablet (20 mg total) by mouth daily.   glimepiride 1 MG tablet Commonly known as: AMARYL Take 1 tablet (1 mg total) by mouth daily with breakfast.   latanoprost 0.005 % ophthalmic solution Commonly known as: XALATAN Place 1 drop into both eyes at bedtime.   nitroGLYCERIN 0.4 MG SL tablet Commonly known as: Nitrostat Place 1 tablet (0.4 mg total) under the tongue every 5 (five) minutes as needed for chest pain.   ONE-A-DAY 50 PLUS PO Take by mouth.   pantoprazole 40 MG tablet Commonly known as: PROTONIX Take 1 tablet (40 mg total) by mouth 2 (two) times daily.   polyethylene glycol 17 g packet Commonly known as: MiraLax Take 17 g by mouth daily. Hold for diarrhea.   tamsulosin 0.4 MG Caps capsule Commonly known as: FLOMAX Take 1 capsule (0.4 mg total) by mouth daily.   Vitamin D 50 MCG (2000 UT) Caps Take 1 capsule (2,000 Units total) by mouth daily.        Allergies:  Allergies  Allergen Reactions   Sulfa Antibiotics Nausea And Vomiting    Family History: Family History  Problem Relation Age of Onset   Heart attack Father     Social History:  reports that he has quit smoking. His smoking use included cigarettes. He has never used smokeless tobacco. He reports that he  does not drink alcohol and does not use drugs.   Physical Exam: BP (!) 144/72   Pulse 84   Constitutional:  Alert and oriented, No acute distress. HEENT: [place] AT, moist mucus membranes.  Trachea midline, no masses. Cardiovascular: No clubbing, cyanosis, or edema. Respiratory: Normal respiratory effort, no increased work of breathing. GI: Abdomen is soft, nontender, nondistended, no abdominal masses GU: No CVA tenderness Skin: No rashes, bruises or suspicious lesions. Neurologic: Grossly intact, no focal deficits, moving all 4 extremities. Psychiatric: Normal mood and affect.  Laboratory Data: Lab Results  Component Value Date   WBC 4.8 05/13/2023   HGB 8.5 (L) 05/13/2023   HCT 26.5 (L) 05/13/2023   MCV 94 05/13/2023   PLT 196 05/13/2023  Lab Results  Component Value Date   CREATININE 1.94 (H) 04/13/2023    No results found for: "PSA"  No results found for: "TESTOSTERONE"  Lab Results  Component Value Date   HGBA1C 5.9 (H) 04/11/2023    Urinalysis    Component Value Date/Time   COLORURINE YELLOW 03/22/2020 1138   APPEARANCEUR Clear 04/01/2023 1543   LABSPEC 1.016 03/22/2020 1138   PHURINE 7.0 03/22/2020 1138   GLUCOSEU Trace (A) 04/01/2023 1543   HGBUR NEGATIVE 03/22/2020 1138   BILIRUBINUR Negative 04/01/2023 1543   KETONESUR NEGATIVE 03/22/2020 1138   PROTEINUR 2+ (A) 04/01/2023 1543   PROTEINUR 30 (A) 03/22/2020 1138   NITRITE Negative 04/01/2023 1543   NITRITE NEGATIVE 03/22/2020 1138   LEUKOCYTESUR Negative 04/01/2023 1543   LEUKOCYTESUR NEGATIVE 03/22/2020 1138    Lab Results  Component Value Date   LABMICR See below: 04/01/2023   WBCUA 0-5 04/01/2023   LABEPIT 0-10 04/01/2023   BACTERIA None seen 04/01/2023    Pertinent Imaging: *** Results for orders placed during the hospital encounter of 04/01/23  DG Abd 1 View  Narrative CLINICAL DATA:  Follow-up kidney stone  EXAM: ABDOMEN - 1 VIEW  COMPARISON:  08/23/2021  FINDINGS: 12 x 12  mm calculus RIGHT kidney.  4 mm calculus LEFT kidney.  No additional urinary tract calcifications.  Nonobstructive bowel gas pattern.  Osseous demineralization with advanced degenerative disc and facet disease changes lumbar spine associated with dextroconvex scoliosis.  Lung bases clear.  IMPRESSION: BILATERAL renal calculi as above.   Electronically Signed By: Ulyses Southward M.D. On: 04/01/2023 17:04  No results found for this or any previous visit.  No results found for this or any previous visit.  No results found for this or any previous visit.  Results for orders placed during the hospital encounter of 11/30/19  US RENAL  Narrative CLINICAL DATA:  Renal calculus, diabetes mellitus, hypertension, history kidney stones  EXAM: RENAL / URINARY TRACT ULTRASOUND COMPLETE  COMPARISON:  CT abdomen and pelvis 02/02/2019  FINDINGS: Right Kidney:  Renal measurements: 11.9 x 4.9 x 4.8 cm = volume: 150 mL. Cortical thinning. Upper normal cortical echogenicity. Cyst at upper pole 7.4 x 6.7 x 5.7 cm, simple features. Additional smaller cyst at mid inferior RIGHT kidney 1.9 x 1.3 x 1.9 cm, simple features. 15 mm diameter calculus identified at inferior pole. No hydronephrosis or additional mass.  Left Kidney:  Renal measurements: 12.3 x 6.0 x 6.0 cm = volume: 218 mL. Cortical thinning. Slightly increased cortical echogenicity. Several LEFT renal cysts are identified, including a 6.6 x 5.7 by 6.0 cm cyst at the upper pole, an additional 5.8 x 5.3 x 4.7 cm cyst at upper pole, and several additional tiny cysts measuring up to 2.6 cm. No solid mass or hydronephrosis. No definite shadowing calculi.  Bladder:  Appears normal for degree of bladder distention.  Other:  Prosthetic enlargement, gland measuring 4.5 cm length by 3.8 cm AP, not adequately visualized in transverse dimension.  IMPRESSION: Prostatic enlargement.  BILATERAL renal cysts without renal mass or  hydronephrosis.  BILATERAL renal cortical thinning and question medical renal disease changes.  15 mm nonobstructing calculus at inferior pole RIGHT kidney.   Electronically Signed By: Ulyses Southward M.D. On: 11/30/2019 18:33  No valid procedures specified. No results found for this or any previous visit.  No results found for this or any previous visit.   Assessment & Plan:    1. BPH with obstruction/lower urinary tract symptoms Disc again  nature r/b/a to finasteride and he and Homero Fellers wish to start.  - Urinalysis, Routine w reflex microscopic  2. [facility] - [facility]   3. MH - discussed imaging and cystoscopy with Homero Fellers and Kendell Bane.   No follow-ups on file.  Jerilee Field, MD  [facility]  [address] [place], [place] [address] [phone]

## 2023-07-16 LAB — CBC WITH DIFFERENTIAL/PLATELET
Basophils Absolute: 0 10*3/uL (ref 0.0–0.2)
Basos: 1 %
EOS (ABSOLUTE): 0.2 10*3/uL (ref 0.0–0.4)
Eos: 2 %
Hematocrit: 34.3 % — ABNORMAL LOW (ref 37.5–51.0)
Hemoglobin: 10.8 g/dL — ABNORMAL LOW (ref 13.0–17.7)
Immature Grans (Abs): 0 10*3/uL (ref 0.0–0.1)
Immature Granulocytes: 0 %
Lymphocytes Absolute: 1.3 10*3/uL (ref 0.7–3.1)
Lymphs: 20 %
MCH: 30.4 pg (ref 26.6–33.0)
MCHC: 31.5 g/dL (ref 31.5–35.7)
MCV: 97 fL (ref 79–97)
Monocytes Absolute: 0.7 10*3/uL (ref 0.1–0.9)
Monocytes: 11 %
Neutrophils Absolute: 4.1 10*3/uL (ref 1.4–7.0)
Neutrophils: 66 %
Platelets: 159 10*3/uL (ref 150–450)
RBC: 3.55 x10E6/uL — ABNORMAL LOW (ref 4.14–5.80)
RDW: 14.7 % (ref 11.6–15.4)
WBC: 6.2 10*3/uL (ref 3.4–10.8)

## 2023-07-16 LAB — IRON,TIBC AND FERRITIN PANEL
Ferritin: 41 ng/mL (ref 30–400)
Iron Saturation: 39 % (ref 15–55)
Iron: 131 ug/dL (ref 38–169)
Total Iron Binding Capacity: 333 ug/dL (ref 250–450)
UIBC: 202 ug/dL (ref 111–343)

## 2023-07-25 ENCOUNTER — Other Ambulatory Visit: Payer: Self-pay

## 2023-07-25 DIAGNOSIS — D500 Iron deficiency anemia secondary to blood loss (chronic): Secondary | ICD-10-CM

## 2023-07-29 ENCOUNTER — Encounter: Payer: Self-pay | Admitting: Nurse Practitioner

## 2023-07-29 ENCOUNTER — Ambulatory Visit: Payer: Medicare Other | Attending: Nurse Practitioner | Admitting: Nurse Practitioner

## 2023-07-29 VITALS — BP 130/66 | HR 72 | Ht 63.0 in | Wt 144.8 lb

## 2023-07-29 DIAGNOSIS — I5032 Chronic diastolic (congestive) heart failure: Secondary | ICD-10-CM | POA: Insufficient documentation

## 2023-07-29 DIAGNOSIS — E785 Hyperlipidemia, unspecified: Secondary | ICD-10-CM | POA: Diagnosis present

## 2023-07-29 DIAGNOSIS — Z79899 Other long term (current) drug therapy: Secondary | ICD-10-CM | POA: Insufficient documentation

## 2023-07-29 DIAGNOSIS — R600 Localized edema: Secondary | ICD-10-CM | POA: Insufficient documentation

## 2023-07-29 DIAGNOSIS — N183 Chronic kidney disease, stage 3 unspecified: Secondary | ICD-10-CM | POA: Diagnosis present

## 2023-07-29 DIAGNOSIS — I2510 Atherosclerotic heart disease of native coronary artery without angina pectoris: Secondary | ICD-10-CM | POA: Diagnosis present

## 2023-07-29 DIAGNOSIS — Z8673 Personal history of transient ischemic attack (TIA), and cerebral infarction without residual deficits: Secondary | ICD-10-CM | POA: Insufficient documentation

## 2023-07-29 NOTE — Patient Instructions (Addendum)
Medication Instructions:  Your physician has recommended you make the following change in your medication:  Increase Lasix to 40 Mg daily for 3 days then reduce back to 20 Mg daily Continue all other medications as prescribed.   Labwork: CMET and lipid panel in 1 week at [facility]   Testing/Procedures: None   Follow-Up: Your physician recommends that you schedule a follow-up appointment in: 6 Months with Martin Frazier   Any Other Special Instructions Will Be Listed Below (If Applicable).  If you need a refill on your cardiac medications before your next appointment, please call your pharmacy.

## 2023-07-29 NOTE — Progress Notes (Unsigned)
Cardiology Office Note:  .   Date:  07/29/2023 ID:  Martin Frazier., DOB February 14, 1934, MRN [id number] PCP: [facility] Providers Cardiologist:  Dina Rich, MD    History of Present Illness: .   Martin Frazier. is a 89 y.o. male with a PMH of chest pain, CAD, chronic HFpEF, HLD, leg edema, CKD stage 3, and hx of CVA, who presents today for 4 month follow-up.   Last seen by Dr. Dina Rich on March 27, 2023. Pt noted some ongoing leg edema, did not take Lasix as previously prescribed, was concerned about frequent urination.   He presents today for 4 month follow-up with his son. He states he is overall doing well from a cardiac perspective.  Son states he is mainly sedentary in the house. Denies any chest pain, shortness of breath, palpitations, syncope, presyncope, dizziness, orthopnea, PND, significant weight changes, acute bleeding, or claudication.  Does admit to some leg swelling.  SH: Former Electrical engineer for [employer]  Studies Reviewed: .    Echocardiogram 03/2023: 1. Left ventricular ejection fraction, by estimation, is 60 to 65%. The  left ventricle has normal function. The left ventricle demonstrates  regional wall motion abnormalities, basal inferior hypokinesis. Left  ventricular diastolic parameters are  consistent with Grade I diastolic dysfunction (impaired relaxation).   2. Right ventricular systolic function is normal. The right ventricular  size is normal. There is normal pulmonary artery systolic pressure. The  estimated right ventricular systolic pressure is 20.6 mmHg.   3. Left atrial size was mild to moderately dilated.   4. The mitral valve is abnormal. Mild mitral valve regurgitation. No  evidence of mitral stenosis. Moderate mitral annular calcification.   5. The aortic valve is tricuspid. Aortic valve regurgitation is mild. No  aortic stenosis is present.   6. The inferior vena cava is normal in size with  greater than 50%  respiratory variability, suggesting right atrial pressure of 3 mmHg.   Comparison(s): No significant change from prior study.  LHC 03/2022:   CULPRIT LESION: Prox LAD lesion is 90% stenosed (ulcerated).  Mid LAD lesion is 80% stenosed.   A drug-eluting stent was successfully placed covering both lesions, using a STENT ONYX FRONTIER 2.75X30.  Stent was postdilated to 3.1 mm proximally tapered to 2.9 mm distally   Post intervention, there is a 0% residual stenosis of the stent.Marland Kitchen   -------------------------------------------------   Ramus lesion is 80% stenosed. -Would be PCI amenable, but may not be causing symptoms.  Would recommend medical management at this time.  If symptoms warrant, can consider staged PCI.   Small caliber (<2 mm ) RCA: Prox RCA to Mid RCA lesion is 90% stenosed. RPAV lesion is 100% stenosed (fills via left to right collaterals)   Mid LAD lesion is 80% stenosed.   -------------------------------------------------   LV end diastolic pressure is moderately elevated.   There is no aortic valve stenosis.   SUMMARY Severe Three-Vessel CAD: Very small caliber (however dominant) RCA with mid 90% stenosis followed by CTO (flush occlusion) of RPA V with 2 PL branches filling via collaterals from the LCx (PDA also has retrograde filling from septal perforator branches) Normal caliber LAD with ulcerated 90% proximal LAD (before SP1) followed by sequential 80% lesion just beyond SP1 with then mild diffuse disease distally.  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 cardiac telemetry unit for overnight monitoring post PCI.  Expect discharge tomorrow. For now would recommend medical therapy for the Ramus Intermediate lesion, as there is no evidence of ischemia in that distribution on Myoview.  However if he were to have concerning symptoms either prior to discharge or in the outpatient setting, would be a potential target for PCI. Continue aggressive Cardiovascular Risk Reduction with Guideline Directed Medical Therapy per primary team..     Physical Exam:   VS:  BP 130/66 (BP Location: Left Arm, Patient Position: Sitting, Cuff Size: Normal)   Pulse 72   Ht 5\' 3"  (1.6 m)   Wt 144 lb 12.8 oz (65.7 kg)   SpO2 97%   BMI 25.65 kg/m    Wt Readings from Last 3 Encounters:  07/29/23 144 lb 12.8 oz (65.7 kg)  05/13/23 142 lb 9.6 oz (64.7 kg)  04/13/23 124 lb 9 oz (56.5 kg)    GEN: Well nourished, well developed in no acute distress NECK: No JVD; No carotid bruits CARDIAC: S1/S2, RRR, no murmurs, rubs, gallops RESPIRATORY:  Clear to auscultation without rales, wheezing or rhonchi  ABDOMEN: Soft, non-tender, non-distended EXTREMITIES:  2+ leg bilateral edema ; No deformity   ASSESSMENT AND PLAN: .    HFpEF, medication management Stage C, NYHA class II symptoms.  EF 60 to 65%.  Overall does appear euvolemic and well compensated on exam.  Weight is stable.  Does admit to some leg edema.  He does admit to compliance with Lasix.  Will increase Lasix to 40 mg daily x 3 days, then return to Lasix 20 mg daily thereafter.  Continue rest of medication regimen. At next visit, consider SGLT2i. Low sodium diet, fluid restriction <2L, and daily weights encouraged. Educated to contact our office for weight gain of 2 lbs overnight or 5 lbs in one week.  CAD He is s/p DES to LAD 03/2022.  Ramus disease was managed medically to limit contrast, but could consider intervention pending symptoms.  Denies any anginal symptoms, therefore no  indication for ischemic evaluation at this time. Continue Atorvastatin, Plavix, and NTG PRN. Heart healthy diet and regular cardiovascular exercise encouraged.   HLD No recent lipid panel obtained this year.  Will obtain FLP and CMET in 1 week.  Continue atorvastatin. Heart healthy diet and regular cardiovascular exercise encouraged.   Leg edema Does admit to some leg edema, 2+ edema noted on exam.  He is compliant with taking Lasix 20 mg daily.  Will increase Lasix to 40 mg daily x 3 days, then return to Lasix 20 mg daily thereafter as mentioned above.  Will obtain CMET in 1 week as mentioned above.  Low-salt, heart healthy diet encouraged as well as leg elevation.  Patient declines compression stockings.  CKD stage 3 Appears that serum creatinine ranges from 1.53-1.97.  Will be obtaining CMET in 1 week as mentioned above.  Avoid nephrotoxic agents.  Encourage adequate hydration.  Continue to follow-up with PCP.  Hx of CVA Denies any recent symptoms.  Continue Plavix. Continue to follow with PCP. Heart healthy diet and regular cardiovascular exercise encouraged.   Dispo: Follow-up with me or [facility] in 6 months or sooner if anything changes.  Signed,  Sharlene Dory, NP

## 2023-08-07 ENCOUNTER — Telehealth: Payer: Self-pay | Admitting: *Deleted

## 2023-08-07 DIAGNOSIS — Z79899 Other long term (current) drug therapy: Secondary | ICD-10-CM

## 2023-08-07 DIAGNOSIS — N183 Chronic kidney disease, stage 3 unspecified: Secondary | ICD-10-CM

## 2023-08-07 NOTE — Telephone Encounter (Signed)
-----   Message from Sharlene Dory sent at 08/07/2023  2:50 PM EDT ----- Labs look good, but kidney function is a little elevated, most likely d/t the increase in Lasix. Recommend referral to Nephrology if he doesn't have one already. He's in between CKD stage 3-4. He should now be taking Lasix 20 mg daily, recommend adequate hydration, and let's repeat a BMET in 2 weeks for diagnosis of "CKD stage 3."  Follow-up as scheduled.  Thanks!  Sharlene Dory, AGNP-C

## 2023-08-07 NOTE — Telephone Encounter (Signed)
Patient's wife Scarlette Calico informed and verbalized understanding of plan. Lab ordered for [facility] Copy sent to PCP

## 2023-08-21 ENCOUNTER — Other Ambulatory Visit (HOSPITAL_COMMUNITY)
Admission: RE | Admit: 2023-08-21 | Discharge: 2023-08-21 | Disposition: A | Discharge status: Home / Self Care | Payer: Medicare Other | Source: Ambulatory Visit | Attending: Nurse Practitioner | Admitting: Nurse Practitioner

## 2023-08-21 DIAGNOSIS — D500 Iron deficiency anemia secondary to blood loss (chronic): Secondary | ICD-10-CM | POA: Diagnosis present

## 2023-08-21 DIAGNOSIS — N183 Chronic kidney disease, stage 3 unspecified: Secondary | ICD-10-CM | POA: Diagnosis present

## 2023-08-21 LAB — CBC WITH DIFFERENTIAL/PLATELET
Abs Immature Granulocytes: 0.02 10*3/uL (ref 0.00–0.07)
Basophils Absolute: 0 10*3/uL (ref 0.0–0.1)
Basophils Relative: 0 %
Eosinophils Absolute: 0.2 10*3/uL (ref 0.0–0.5)
Eosinophils Relative: 3 %
HCT: 34.5 % — ABNORMAL LOW (ref 39.0–52.0)
Hemoglobin: 11.1 g/dL — ABNORMAL LOW (ref 13.0–17.0)
Immature Granulocytes: 0 %
Lymphocytes Relative: 22 %
Lymphs Abs: 1.4 10*3/uL (ref 0.7–4.0)
MCH: 31.9 pg (ref 26.0–34.0)
MCHC: 32.2 g/dL (ref 30.0–36.0)
MCV: 99.1 fL (ref 80.0–100.0)
Monocytes Absolute: 0.7 10*3/uL (ref 0.1–1.0)
Monocytes Relative: 11 %
Neutro Abs: 4 10*3/uL (ref 1.7–7.7)
Neutrophils Relative %: 64 %
Platelets: 135 10*3/uL — ABNORMAL LOW (ref 150–400)
RBC: 3.48 MIL/uL — ABNORMAL LOW (ref 4.22–5.81)
RDW: 15.7 % — ABNORMAL HIGH (ref 11.5–15.5)
WBC: 6.3 10*3/uL (ref 4.0–10.5)
nRBC: 0 % (ref 0.0–0.2)

## 2023-08-21 LAB — IRON AND TIBC
Iron: 93 ug/dL (ref 45–182)
Saturation Ratios: 28 % (ref 17.9–39.5)
TIBC: 333 ug/dL (ref 250–450)
UIBC: 240 ug/dL

## 2023-08-21 LAB — FERRITIN: Ferritin: 29 ng/mL (ref 24–336)

## 2023-08-28 ENCOUNTER — Ambulatory Visit (HOSPITAL_COMMUNITY)
Admission: RE | Admit: 2023-08-28 | Discharge: 2023-08-28 | Disposition: A | Discharge status: Home / Self Care | Payer: Medicare Other | Source: Ambulatory Visit | Attending: Urology | Admitting: Urology

## 2023-08-28 DIAGNOSIS — N200 Calculus of kidney: Secondary | ICD-10-CM | POA: Diagnosis present

## 2023-08-28 DIAGNOSIS — R3129 Other microscopic hematuria: Secondary | ICD-10-CM | POA: Insufficient documentation

## 2023-08-29 ENCOUNTER — Other Ambulatory Visit: Payer: Self-pay

## 2023-08-29 ENCOUNTER — Telehealth: Payer: Self-pay | Admitting: Cardiology

## 2023-08-29 DIAGNOSIS — D500 Iron deficiency anemia secondary to blood loss (chronic): Secondary | ICD-10-CM

## 2023-08-29 NOTE — Telephone Encounter (Signed)
Dr. Lucio Edward office is requesting we send the patient's insurance information to them. The fax number is [phone].

## 2023-09-04 ENCOUNTER — Other Ambulatory Visit (HOSPITAL_COMMUNITY): Payer: Self-pay | Admitting: Nephrology

## 2023-09-04 DIAGNOSIS — N1832 Chronic kidney disease, stage 3b: Secondary | ICD-10-CM

## 2023-09-12 ENCOUNTER — Ambulatory Visit (HOSPITAL_COMMUNITY)
Admission: RE | Admit: 2023-09-12 | Discharge: 2023-09-12 | Disposition: A | Discharge status: Home / Self Care | Payer: Medicare Other | Source: Ambulatory Visit | Attending: Nephrology | Admitting: Nephrology

## 2023-09-12 DIAGNOSIS — N1832 Chronic kidney disease, stage 3b: Secondary | ICD-10-CM | POA: Insufficient documentation

## 2023-09-16 ENCOUNTER — Ambulatory Visit (INDEPENDENT_AMBULATORY_CARE_PROVIDER_SITE_OTHER): Payer: Medicare Other | Admitting: Urology

## 2023-09-16 VITALS — BP 132/63 | HR 74

## 2023-09-16 DIAGNOSIS — N200 Calculus of kidney: Secondary | ICD-10-CM

## 2023-09-16 DIAGNOSIS — R3129 Other microscopic hematuria: Secondary | ICD-10-CM

## 2023-09-16 DIAGNOSIS — N138 Other obstructive and reflux uropathy: Secondary | ICD-10-CM

## 2023-09-16 DIAGNOSIS — N401 Enlarged prostate with lower urinary tract symptoms: Secondary | ICD-10-CM

## 2023-09-16 LAB — URINALYSIS, ROUTINE W REFLEX MICROSCOPIC
Bilirubin, UA: NEGATIVE
Ketones, UA: NEGATIVE
Leukocytes,UA: NEGATIVE
Nitrite, UA: NEGATIVE
Specific Gravity, UA: 1.025 (ref 1.005–1.030)
Urobilinogen, Ur: 0.2 mg/dL (ref 0.2–1.0)
pH, UA: 6 (ref 5.0–7.5)

## 2023-09-16 LAB — MICROSCOPIC EXAMINATION: Bacteria, UA: NONE SEEN

## 2023-09-16 MED ORDER — CIPROFLOXACIN HCL 500 MG PO TABS
500.0000 mg | ORAL_TABLET | Freq: Once | ORAL | Status: DC
Start: 2023-09-16 — End: 2023-12-14

## 2023-09-16 NOTE — Progress Notes (Unsigned)
  [place]  09/16/23  CC: No chief complaint on file.   HPI:  F/u -    1) BPH-patient had a history of urinary retention in 2020. No surgery. He is on tamsulosin and started finasteride January 2024. Prostate was about 60 g. IPSS was 6.    2) PSA elevation-his PSA was 9.7 and 10.8 at the [place] in December 2023.  No prior prostate biopsy.  Prostate about 60 g on CT.  His January 2024 prostate exam was benign and January 2024 PSA was 7.6 prior to starting finasteride.   3) kidney stones-he underwent shockwave lithotripsy for a right ureteral stone in 2020.  Follow-up imaging showed resolution of ureteral stone and [place] no hydro in 2020. He has a known 12 mm RLP stone.    He underwent KUB at [facility] Apr 2024 which revealed a 13 mm right lower renal stone, 4 mm left upper renal stone (similar to KUB '22, CT '20).   Today, seen for the above for cystoscopy. UA in Jul 2024 with with 3-10 rbc. Aug 2024 CT benign with 13 mm RLP, 6 mm LUP.  No flank pain or gross hematuria. No dysuria or stone passage. Nocturia has improved over time. Now 0-1/.    Here with son Homero Fellers.     There were no vitals taken for this visit. NED. A&Ox3.   No respiratory distress   Abd soft, NT, ND Normal phallus with bilateral descended testicles  Cystoscopy Procedure Note  Patient identification was confirmed, informed consent was obtained, and patient was prepped using Betadine solution.  Lidocaine jelly was administered per urethral meatus.     Pre-Procedure: - Inspection reveals a normal caliber ureteral meatus.  Procedure: The flexible cystoscope was introduced without difficulty - No urethral strictures/lesions are present. - moderate prostate enlargement with borderline BOO  - normal bladder neck - Bilateral ureteral orifices identified - Bladder mucosa  reveals no ulcers, tumors, or lesions - No bladder stones - moderate trabeculation  Retroflexion shows normal bladder and bladder neck - no  lesions   Post-Procedure: - Patient tolerated the procedure well  Assessment/ Plan:  MH - benign eval.   Kidney stones - stable to 2020. KUB in 1 yr.   BPH - cont tams and 5ari    No follow-ups on file.  Jerilee Field, MD

## 2023-10-17 ENCOUNTER — Other Ambulatory Visit: Payer: Self-pay

## 2023-10-17 MED ORDER — TAMSULOSIN HCL 0.4 MG PO CAPS: 0.4000 mg | ORAL_CAPSULE | Freq: Every day | ORAL | 11 refills | Status: DC

## 2023-10-17 NOTE — Telephone Encounter (Signed)
Wife called to request refill to be sent for T[facility]ulosin to [facility]

## 2023-11-25 ENCOUNTER — Other Ambulatory Visit: Payer: Self-pay

## 2023-11-25 DIAGNOSIS — D500 Iron deficiency anemia secondary to blood loss (chronic): Secondary | ICD-10-CM

## 2024-01-01 DEATH — deceased

## 2024-01-27 ENCOUNTER — Ambulatory Visit: Payer: Medicare Other | Admitting: Nurse Practitioner

## 2024-09-28 ENCOUNTER — Ambulatory Visit: Payer: Medicare Other | Admitting: Urology
